# Patient Record
Sex: Female | Born: 1935
Health system: Southern US, Community
[De-identification: ages and names within clinical notes are randomized; demographics above are authoritative.]

## PROBLEM LIST (undated history)

## (undated) DIAGNOSIS — T148XXA Other injury of unspecified body region, initial encounter: Secondary | ICD-10-CM

## (undated) DIAGNOSIS — J309 Allergic rhinitis, unspecified: Secondary | ICD-10-CM

## (undated) DIAGNOSIS — R609 Edema, unspecified: Secondary | ICD-10-CM

## (undated) DIAGNOSIS — M810 Age-related osteoporosis without current pathological fracture: Secondary | ICD-10-CM

## (undated) DIAGNOSIS — E785 Hyperlipidemia, unspecified: Secondary | ICD-10-CM

## (undated) DIAGNOSIS — I10 Essential (primary) hypertension: Secondary | ICD-10-CM

## (undated) DIAGNOSIS — E079 Disorder of thyroid, unspecified: Secondary | ICD-10-CM

## (undated) HISTORY — DX: Other injury of unspecified body region, initial encounter: T14.8XXA

## (undated) HISTORY — DX: Essential (primary) hypertension: I10

## (undated) HISTORY — DX: Edema, unspecified: R60.9

## (undated) HISTORY — DX: Hyperlipidemia, unspecified: E78.5

## (undated) HISTORY — PX: APPENDECTOMY: SHX54

## (undated) HISTORY — DX: Allergic rhinitis, unspecified: J30.9

## (undated) HISTORY — PX: TONSILLECTOMY: SUR1361

## (undated) HISTORY — DX: Age-related osteoporosis without current pathological fracture: M81.0

## (undated) HISTORY — DX: Disorder of thyroid, unspecified: E07.9

---

## 2010-05-12 LAB — HM MAMMOGRAPHY

## 2010-05-20 ENCOUNTER — Encounter: Payer: Self-pay | Admitting: Nurse Practitioner

## 2010-05-20 DIAGNOSIS — I10 Essential (primary) hypertension: Secondary | ICD-10-CM

## 2010-05-20 DIAGNOSIS — E785 Hyperlipidemia, unspecified: Secondary | ICD-10-CM

## 2010-05-20 DIAGNOSIS — R42 Dizziness and giddiness: Secondary | ICD-10-CM

## 2010-05-20 DIAGNOSIS — J309 Allergic rhinitis, unspecified: Secondary | ICD-10-CM | POA: Insufficient documentation

## 2010-05-20 DIAGNOSIS — R0609 Other forms of dyspnea: Secondary | ICD-10-CM

## 2010-05-20 DIAGNOSIS — M810 Age-related osteoporosis without current pathological fracture: Secondary | ICD-10-CM | POA: Insufficient documentation

## 2011-03-02 DIAGNOSIS — B079 Viral wart, unspecified: Secondary | ICD-10-CM | POA: Diagnosis not present

## 2011-03-02 DIAGNOSIS — R609 Edema, unspecified: Secondary | ICD-10-CM | POA: Diagnosis not present

## 2011-03-02 DIAGNOSIS — E039 Hypothyroidism, unspecified: Secondary | ICD-10-CM | POA: Diagnosis not present

## 2011-03-02 DIAGNOSIS — I10 Essential (primary) hypertension: Secondary | ICD-10-CM | POA: Diagnosis not present

## 2011-06-22 DIAGNOSIS — I10 Essential (primary) hypertension: Secondary | ICD-10-CM | POA: Diagnosis not present

## 2011-06-22 DIAGNOSIS — E785 Hyperlipidemia, unspecified: Secondary | ICD-10-CM | POA: Diagnosis not present

## 2011-06-22 DIAGNOSIS — E039 Hypothyroidism, unspecified: Secondary | ICD-10-CM | POA: Diagnosis not present

## 2011-06-22 DIAGNOSIS — E559 Vitamin D deficiency, unspecified: Secondary | ICD-10-CM | POA: Diagnosis not present

## 2011-10-07 DIAGNOSIS — B079 Viral wart, unspecified: Secondary | ICD-10-CM | POA: Diagnosis not present

## 2011-10-12 DIAGNOSIS — R5381 Other malaise: Secondary | ICD-10-CM | POA: Diagnosis not present

## 2011-10-12 DIAGNOSIS — E785 Hyperlipidemia, unspecified: Secondary | ICD-10-CM | POA: Diagnosis not present

## 2011-10-12 DIAGNOSIS — E039 Hypothyroidism, unspecified: Secondary | ICD-10-CM | POA: Diagnosis not present

## 2011-10-12 DIAGNOSIS — R5383 Other fatigue: Secondary | ICD-10-CM | POA: Diagnosis not present

## 2011-10-12 DIAGNOSIS — I10 Essential (primary) hypertension: Secondary | ICD-10-CM | POA: Diagnosis not present

## 2011-10-12 DIAGNOSIS — R609 Edema, unspecified: Secondary | ICD-10-CM | POA: Diagnosis not present

## 2011-11-30 DIAGNOSIS — Z23 Encounter for immunization: Secondary | ICD-10-CM | POA: Diagnosis not present

## 2012-02-01 DIAGNOSIS — E039 Hypothyroidism, unspecified: Secondary | ICD-10-CM | POA: Diagnosis not present

## 2012-02-01 DIAGNOSIS — E785 Hyperlipidemia, unspecified: Secondary | ICD-10-CM | POA: Diagnosis not present

## 2012-02-01 DIAGNOSIS — I10 Essential (primary) hypertension: Secondary | ICD-10-CM | POA: Diagnosis not present

## 2012-02-01 DIAGNOSIS — R609 Edema, unspecified: Secondary | ICD-10-CM | POA: Diagnosis not present

## 2012-02-01 DIAGNOSIS — E559 Vitamin D deficiency, unspecified: Secondary | ICD-10-CM | POA: Diagnosis not present

## 2012-05-30 ENCOUNTER — Other Ambulatory Visit: Payer: Self-pay | Admitting: Nurse Practitioner

## 2012-05-30 MED ORDER — SIMVASTATIN 20 MG PO TABS: 20.0000 mg | ORAL_TABLET | Freq: Every day | ORAL | Status: DC

## 2012-07-04 ENCOUNTER — Encounter: Payer: Self-pay | Admitting: Nurse Practitioner

## 2012-07-04 ENCOUNTER — Ambulatory Visit (INDEPENDENT_AMBULATORY_CARE_PROVIDER_SITE_OTHER): Payer: Medicare Other | Admitting: Nurse Practitioner

## 2012-07-04 VITALS — BP 132/65 | HR 61 | Temp 97.2°F | Ht 63.0 in | Wt 174.0 lb

## 2012-07-04 DIAGNOSIS — I10 Essential (primary) hypertension: Secondary | ICD-10-CM

## 2012-07-04 DIAGNOSIS — M810 Age-related osteoporosis without current pathological fracture: Secondary | ICD-10-CM

## 2012-07-04 DIAGNOSIS — E785 Hyperlipidemia, unspecified: Secondary | ICD-10-CM

## 2012-07-04 LAB — COMPLETE METABOLIC PANEL WITH GFR
Alkaline Phosphatase: 63 U/L (ref 39–117)
BUN: 24 mg/dL — ABNORMAL HIGH (ref 6–23)
CO2: 25 mEq/L (ref 19–32)
Creat: 1.18 mg/dL — ABNORMAL HIGH (ref 0.50–1.10)
GFR, Est African American: 51 mL/min — ABNORMAL LOW
GFR, Est Non African American: 45 mL/min — ABNORMAL LOW
Glucose, Bld: 93 mg/dL (ref 70–99)
Sodium: 140 mEq/L (ref 135–145)
Total Bilirubin: 0.7 mg/dL (ref 0.3–1.2)
Total Protein: 7.1 g/dL (ref 6.0–8.3)

## 2012-07-04 MED ORDER — METOPROLOL TARTRATE 50 MG PO TABS: 50.0000 mg | ORAL_TABLET | Freq: Two times a day (BID) | ORAL | Status: DC

## 2012-07-04 NOTE — Patient Instructions (Signed)

## 2012-07-04 NOTE — Progress Notes (Signed)
Subjective:    Patient ID: Audrey Manning, female    DOB: 1932-05-14, 79 y.o.   MRN: [id number]  Hypertension This is a chronic problem. The current episode started more than 1 year ago. The problem is unchanged. The problem is controlled. Associated symptoms include peripheral edema. Pertinent negatives include no headaches, neck pain, orthopnea, palpitations or shortness of breath. There are no associated agents to hypertension. Risk factors for coronary artery disease include obesity, sedentary lifestyle and post-menopausal state. Past treatments include calcium channel blockers, angiotensin blockers, beta blockers and diuretics. The current treatment provides significant improvement. Hypertensive end-organ damage includes a thyroid problem.  Hyperlipidemia This is a chronic problem. The current episode started more than 1 year ago. The problem is controlled. Recent lipid tests were reviewed and are normal. Exacerbating diseases include obesity. There are no known factors aggravating her hyperlipidemia. Pertinent negatives include no focal weakness, leg pain, myalgias or shortness of breath. Current antihyperlipidemic treatment includes statins. The current treatment provides significant improvement of lipids. Compliance problems include adherence to diet and adherence to exercise.  Risk factors for coronary artery disease include hypertension, obesity, post-menopausal and a sedentary lifestyle.  Thyroid Problem Presents for follow-up visit. Symptoms include hair loss. Patient reports no cold intolerance, diaphoresis, diarrhea, dry skin, hoarse voice, leg swelling, menstrual problem, palpitations, weight gain or weight loss. The symptoms have been stable. Her past medical history is significant for hyperlipidemia.  osteoporosis Fosamax weekly- No C/o side effects.     Review of Systems  Constitutional: Negative for weight loss, weight gain and diaphoresis.  HENT: Negative for hoarse voice and  neck pain.   Respiratory: Negative for shortness of breath.   Cardiovascular: Negative for palpitations and orthopnea.  Gastrointestinal: Negative for diarrhea.  Endocrine: Negative for cold intolerance.  Genitourinary: Negative for menstrual problem.  Musculoskeletal: Negative for myalgias.  Neurological: Negative for focal weakness and headaches.  All other systems reviewed and are negative.       Objective:   Physical Exam  Constitutional: She is oriented to person, place, and time. She appears well-developed and well-nourished.  HENT:  Nose: Nose normal.  Mouth/Throat: Oropharynx is clear and moist.  Eyes: EOM are normal.  Neck: Trachea normal, normal range of motion and full passive range of motion without pain. Neck supple. No JVD present. Carotid bruit is not present. No thyromegaly present.  Cardiovascular: Normal rate, regular rhythm, normal heart sounds and intact distal pulses.  Exam reveals no gallop and no friction rub.   No murmur heard. Pulmonary/Chest: Effort normal and breath sounds normal.  Abdominal: Soft. Bowel sounds are normal. She exhibits no distension and no mass. There is no tenderness.  Musculoskeletal: Normal range of motion.  Lymphadenopathy:    She has no cervical adenopathy.  Neurological: She is alert and oriented to person, place, and time. She has normal reflexes.  Skin: Skin is warm and dry.  Psychiatric: She has a normal mood and affect. Her behavior is normal. Judgment and thought content normal.    BP 132/65  Pulse 61  Temp(Src) 97.2 F (36.2 C) (Oral)  Ht 5\' 3"  (1.6 m)  Wt 174 lb (78.926 kg)  BMI 30.83 kg/m2       Assessment & Plan:  1. Hypertension Low NA+ diet  - metoprolol (LOPRESSOR) 50 MG tablet; Take 1 tablet (50 mg total) by mouth 2 (two) times daily.  Dispense: 180 tablet; Refill: 1 - COMPLETE METABOLIC PANEL WITH GFR  2. Hyperlipidemia Low fat diet an dexercise Continue  Zocor as rx - NMR Lipoprofile with Lipids  3.  Osteoporosis, unspecified Fosamax as rx  4. Hypothyroidism Continue levothyroxine as rx  Mary-Margaret Daphine Deutscher, FNP

## 2012-07-06 LAB — NMR LIPOPROFILE WITH LIPIDS
HDL Size: 9.3 nm (ref >=9.2)
HDL-C: 51 mg/dL (ref >=40)
LDL Particle Number: 766 nmol/L (ref <1000)
LDL Size: 20.3 nm — ABNORMAL LOW (ref >20.5)
Large HDL-P: 9.6 umol/L (ref >=4.8)
Triglycerides: 100 mg/dL (ref <150)
VLDL Size: 47.5 nm — ABNORMAL HIGH (ref <=46.6)

## 2012-08-25 ENCOUNTER — Other Ambulatory Visit: Payer: Self-pay | Admitting: *Deleted

## 2012-08-25 MED ORDER — LOSARTAN POTASSIUM 100 MG PO TABS: 100.0000 mg | ORAL_TABLET | Freq: Every day | ORAL | Status: DC

## 2012-08-25 MED ORDER — AMLODIPINE BESYLATE 5 MG PO TABS: 5.0000 mg | ORAL_TABLET | Freq: Every day | ORAL | Status: DC

## 2012-08-25 MED ORDER — SIMVASTATIN 20 MG PO TABS: 20.0000 mg | ORAL_TABLET | Freq: Every day | ORAL | Status: DC

## 2012-10-10 ENCOUNTER — Ambulatory Visit (INDEPENDENT_AMBULATORY_CARE_PROVIDER_SITE_OTHER): Payer: Medicare Other | Admitting: Nurse Practitioner

## 2012-10-10 ENCOUNTER — Encounter: Payer: Self-pay | Admitting: Nurse Practitioner

## 2012-10-10 ENCOUNTER — Ambulatory Visit: Payer: 59 | Admitting: Nurse Practitioner

## 2012-10-10 VITALS — BP 142/65 | HR 65 | Temp 97.9°F | Wt 176.0 lb

## 2012-10-10 DIAGNOSIS — I10 Essential (primary) hypertension: Secondary | ICD-10-CM | POA: Diagnosis not present

## 2012-10-10 DIAGNOSIS — M810 Age-related osteoporosis without current pathological fracture: Secondary | ICD-10-CM

## 2012-10-10 DIAGNOSIS — E785 Hyperlipidemia, unspecified: Secondary | ICD-10-CM | POA: Diagnosis not present

## 2012-10-10 MED ORDER — METOPROLOL TARTRATE 50 MG PO TABS: 50.0000 mg | ORAL_TABLET | Freq: Two times a day (BID) | ORAL | Status: DC

## 2012-10-10 NOTE — Progress Notes (Signed)
Subjective:    Patient ID: Audrey Manning, female    DOB: 1932/04/10, 79 y.o.   MRN: [id number]  Hypertension This is a chronic problem. The current episode started more than 1 year ago. The problem is unchanged. The problem is controlled. Associated symptoms include peripheral edema. Pertinent negatives include no headaches, neck pain, orthopnea, palpitations or shortness of breath. There are no associated agents to hypertension. Risk factors for coronary artery disease include obesity, sedentary lifestyle and post-menopausal state. Past treatments include calcium channel blockers, angiotensin blockers, beta blockers and diuretics. The current treatment provides significant improvement. Hypertensive end-organ damage includes a thyroid problem.  Hyperlipidemia This is a chronic problem. The current episode started more than 1 year ago. The problem is controlled. Recent lipid tests were reviewed and are normal. Exacerbating diseases include obesity. There are no known factors aggravating her hyperlipidemia. Pertinent negatives include no focal weakness, leg pain, myalgias or shortness of breath. Current antihyperlipidemic treatment includes statins. The current treatment provides significant improvement of lipids. Compliance problems include adherence to diet and adherence to exercise.  Risk factors for coronary artery disease include hypertension, obesity, post-menopausal and a sedentary lifestyle.  Thyroid Problem Presents for follow-up visit. Symptoms include hair loss. Patient reports no cold intolerance, diaphoresis, diarrhea, dry skin, hoarse voice, leg swelling, menstrual problem, palpitations, weight gain or weight loss. The symptoms have been stable. Her past medical history is significant for hyperlipidemia.  osteoporosis Fosamax weekly- No C/o side effects.     Review of Systems  Constitutional: Negative for weight loss, weight gain and diaphoresis.  HENT: Negative for hoarse voice and  neck pain.   Respiratory: Negative for shortness of breath.   Cardiovascular: Negative for palpitations and orthopnea.  Gastrointestinal: Negative for diarrhea.  Endocrine: Negative for cold intolerance.  Genitourinary: Negative for menstrual problem.  Musculoskeletal: Negative for myalgias.  Neurological: Negative for focal weakness and headaches.  All other systems reviewed and are negative.       Objective:   Physical Exam  Constitutional: She is oriented to person, place, and time. She appears well-developed and well-nourished.  HENT:  Nose: Nose normal.  Mouth/Throat: Oropharynx is clear and moist.  Eyes: EOM are normal.  Neck: Trachea normal, normal range of motion and full passive range of motion without pain. Neck supple. No JVD present. Carotid bruit is not present. No thyromegaly present.  Cardiovascular: Normal rate, regular rhythm, normal heart sounds and intact distal pulses.  Exam reveals no gallop and no friction rub.   No murmur heard. Pulmonary/Chest: Effort normal and breath sounds normal.  Abdominal: Soft. Bowel sounds are normal. She exhibits no distension and no mass. There is no tenderness.  Musculoskeletal: Normal range of motion.  Lymphadenopathy:    She has no cervical adenopathy.  Neurological: She is alert and oriented to person, place, and time. She has normal reflexes.  Skin: Skin is warm and dry.  Psychiatric: She has a normal mood and affect. Her behavior is normal. Judgment and thought content normal.    BP 142/65  Pulse 65  Temp(Src) 97.9 F (36.6 C) (Oral)  Wt 176 lb (79.833 kg)  BMI 31.18 kg/m2       Assessment & Plan:  1. Hypertension Low NA+ diet  - metoprolol (LOPRESSOR) 50 MG tablet; Take 1 tablet (50 mg total) by mouth 2 (two) times daily.  Dispense: 180 tablet; Refill: 1 - COMPLETE METABOLIC PANEL WITH GFR  2. Hyperlipidemia Low fat diet an dexercise Continue Zocor as rx - NMR Lipoprofile  with Lipids  3. Osteoporosis,  unspecified Fosamax as rx  4. Hypothyroidism Continue levothyroxine as rx  Health maintenance reviewed Will schedule dexa  Mary-Margaret Daphine Deutscher, FNP

## 2012-10-10 NOTE — Patient Instructions (Signed)

## 2012-10-11 LAB — CMP14+EGFR
ALT: 15 IU/L (ref 0–32)
AST: 25 IU/L (ref 0–40)
Albumin: 4.7 g/dL (ref 3.5–4.8)
Alkaline Phosphatase: 56 IU/L (ref 39–117)
BUN/Creatinine Ratio: 19 (ref 11–26)
CO2: 21 mmol/L (ref 18–29)
Chloride: 104 mmol/L (ref 97–108)
GFR calc Af Amer: 50 mL/min/{1.73_m2} — ABNORMAL LOW (ref >59)
Potassium: 5.9 mmol/L — ABNORMAL HIGH (ref 3.5–5.2)
Sodium: 139 mmol/L (ref 134–144)
Total Bilirubin: 0.5 mg/dL (ref 0.0–1.2)

## 2012-10-11 LAB — NMR, LIPOPROFILE
Cholesterol: 156 mg/dL (ref <200)
LDL Particle Number: 930 nmol/L (ref <1000)
LDL Size: 20.8 nm (ref >20.5)
LDLC SERPL CALC-MCNC: 67 mg/dL (ref <100)
LP-IR Score: 32 (ref <=45)

## 2012-11-16 ENCOUNTER — Other Ambulatory Visit: Payer: Medicare Other

## 2012-11-16 ENCOUNTER — Ambulatory Visit: Payer: 59

## 2012-11-18 ENCOUNTER — Other Ambulatory Visit: Payer: Self-pay

## 2012-11-18 MED ORDER — ALENDRONATE SODIUM 70 MG PO TABS: 70.0000 mg | ORAL_TABLET | ORAL | Status: DC

## 2013-01-03 ENCOUNTER — Ambulatory Visit (INDEPENDENT_AMBULATORY_CARE_PROVIDER_SITE_OTHER): Payer: Medicare Other

## 2013-01-03 DIAGNOSIS — Z23 Encounter for immunization: Secondary | ICD-10-CM

## 2013-01-16 ENCOUNTER — Ambulatory Visit (INDEPENDENT_AMBULATORY_CARE_PROVIDER_SITE_OTHER): Payer: Medicare Other | Admitting: Nurse Practitioner

## 2013-01-16 ENCOUNTER — Encounter: Payer: Self-pay | Admitting: Nurse Practitioner

## 2013-01-16 VITALS — BP 134/63 | HR 56 | Temp 98.4°F | Ht 63.0 in | Wt 173.0 lb

## 2013-01-16 DIAGNOSIS — E785 Hyperlipidemia, unspecified: Secondary | ICD-10-CM

## 2013-01-16 DIAGNOSIS — I10 Essential (primary) hypertension: Secondary | ICD-10-CM

## 2013-01-16 DIAGNOSIS — R0609 Other forms of dyspnea: Secondary | ICD-10-CM

## 2013-01-16 MED ORDER — ALENDRONATE SODIUM 70 MG PO TABS: 70.0000 mg | ORAL_TABLET | ORAL | Status: DC

## 2013-01-16 MED ORDER — SIMVASTATIN 20 MG PO TABS: 20.0000 mg | ORAL_TABLET | Freq: Every day | ORAL | Status: DC

## 2013-01-16 MED ORDER — FUROSEMIDE 20 MG PO TABS: 20.0000 mg | ORAL_TABLET | Freq: Every day | ORAL | Status: DC

## 2013-01-16 MED ORDER — LEVOTHYROXINE SODIUM 50 MCG PO TABS: 50.0000 ug | ORAL_TABLET | Freq: Every day | ORAL | Status: DC

## 2013-01-16 MED ORDER — LOSARTAN POTASSIUM 100 MG PO TABS: 100.0000 mg | ORAL_TABLET | Freq: Every day | ORAL | Status: DC

## 2013-01-16 MED ORDER — METOPROLOL TARTRATE 50 MG PO TABS: 50.0000 mg | ORAL_TABLET | Freq: Two times a day (BID) | ORAL | Status: DC

## 2013-01-16 MED ORDER — AMLODIPINE BESYLATE 5 MG PO TABS: 5.0000 mg | ORAL_TABLET | Freq: Every day | ORAL | Status: DC

## 2013-01-16 NOTE — Patient Instructions (Signed)

## 2013-01-16 NOTE — Progress Notes (Addendum)
Subjective:    Patient ID: Audrey Manning, female    DOB: October 09, 1932, 79 y.o.   MRN: [id number]  Hypertension This is a chronic problem. The current episode started more than 1 year ago. The problem is unchanged. The problem is controlled. Associated symptoms include peripheral edema. Pertinent negatives include no headaches, neck pain, orthopnea, palpitations or shortness of breath. There are no associated agents to hypertension. Risk factors for coronary artery disease include obesity, sedentary lifestyle and post-menopausal state. Past treatments include calcium channel blockers, angiotensin blockers, beta blockers and diuretics. The current treatment provides significant improvement. Hypertensive end-organ damage includes a thyroid problem.  Hyperlipidemia This is a chronic problem. The current episode started more than 1 year ago. The problem is controlled. Recent lipid tests were reviewed and are normal. Exacerbating diseases include obesity. There are no known factors aggravating her hyperlipidemia. Pertinent negatives include no focal weakness, leg pain, myalgias or shortness of breath. Current antihyperlipidemic treatment includes statins. The current treatment provides significant improvement of lipids. Compliance problems include adherence to diet and adherence to exercise.  Risk factors for coronary artery disease include hypertension, obesity, post-menopausal and a sedentary lifestyle.  Thyroid Problem Presents for follow-up visit. Symptoms include hair loss. Patient reports no cold intolerance, diaphoresis, diarrhea, dry skin, hoarse voice, leg swelling, menstrual problem, palpitations, weight gain or weight loss. The symptoms have been stable. Her past medical history is significant for hyperlipidemia.  osteoporosis Fosamax weekly- No C/o side effects.     Review of Systems  Constitutional: Negative for weight loss, weight gain and diaphoresis.  HENT: Negative for hoarse voice.    Respiratory: Negative for shortness of breath.   Cardiovascular: Negative for palpitations and orthopnea.  Gastrointestinal: Negative for diarrhea.  Endocrine: Negative for cold intolerance.  Genitourinary: Negative for menstrual problem.  Musculoskeletal: Negative for myalgias and neck pain.  Neurological: Negative for focal weakness and headaches.  All other systems reviewed and are negative.       Objective:   Physical Exam  Constitutional: She is oriented to person, place, and time. She appears well-developed and well-nourished.  HENT:  Nose: Nose normal.  Mouth/Throat: Oropharynx is clear and moist.  Eyes: EOM are normal.  Neck: Trachea normal, normal range of motion and full passive range of motion without pain. Neck supple. No JVD present. Carotid bruit is not present. No thyromegaly present.  Cardiovascular: Normal rate, regular rhythm, normal heart sounds and intact distal pulses.  Exam reveals no gallop and no friction rub.   No murmur heard. Pulmonary/Chest: Effort normal and breath sounds normal.  Abdominal: Soft. Bowel sounds are normal. She exhibits no distension and no mass. There is no tenderness.  Musculoskeletal: Normal range of motion.  Lymphadenopathy:    She has no cervical adenopathy.  Neurological: She is alert and oriented to person, place, and time. She has normal reflexes.  Skin: Skin is warm and dry.  Psychiatric: She has a normal mood and affect. Her behavior is normal. Judgment and thought content normal.    BP 134/63  Pulse 56  Temp(Src) 98.4 F (36.9 C) (Oral)  Ht 5\' 3"  (1.6 m)  Wt 173 lb (78.472 kg)  BMI 30.65 kg/m2       Assessment & Plan:   1. Hypertension   2. Hyperlipidemia   3. Dyspnea on exertion    Orders Placed This Encounter  Procedures  . CMP14+EGFR  . NMR, lipoprofile   Meds ordered this encounter  Medications  . alendronate (FOSAMAX) 70 MG tablet  Sig: Take 1 tablet (70 mg total) by mouth every 7 (seven) days.  Take with a full glass of water on an empty stomach.    Dispense:  12 tablet    Refill:  1    Order Specific Question:  Supervising Provider    Answer:  Ernestina Penna [[id number]]  . amLODipine (NORVASC) 5 MG tablet    Sig: Take 1 tablet (5 mg total) by mouth daily.    Dispense:  90 tablet    Refill:  1    Order Specific Question:  Supervising Provider    Answer:  Ernestina Penna [[id number]]  . furosemide (LASIX) 20 MG tablet    Sig: Take 1 tablet (20 mg total) by mouth daily.    Dispense:  90 tablet    Refill:  1    Order Specific Question:  Supervising Provider    Answer:  Ernestina Penna [[id number]]  . levothyroxine (SYNTHROID, LEVOTHROID) 50 MCG tablet    Sig: Take 1 tablet (50 mcg total) by mouth daily before breakfast.    Dispense:  90 tablet    Refill:  1    Order Specific Question:  Supervising Provider    Answer:  Ernestina Penna [[id number]]  . losartan (COZAAR) 100 MG tablet    Sig: Take 1 tablet (100 mg total) by mouth daily.    Dispense:  90 tablet    Refill:  1    Order Specific Question:  Supervising Provider    Answer:  Ernestina Penna [[id number]]  . metoprolol (LOPRESSOR) 50 MG tablet    Sig: Take 1 tablet (50 mg total) by mouth 2 (two) times daily.    Dispense:  180 tablet    Refill:  1    Order Specific Question:  Supervising Provider    Answer:  Ernestina Penna [[id number]]  . simvastatin (ZOCOR) 20 MG tablet    Sig: Take 1 tablet (20 mg total) by mouth at bedtime.    Dispense:  90 tablet    Refill:  1    Order Specific Question:  Supervising Provider    Answer:  Deborra Medina    Continue all meds Labs pending Diet and exercise encouraged Health maintenance reviewed Follow up in 3 months hemocult cards given Mary-Margaret Daphine Deutscher, FNP

## 2013-01-17 LAB — NMR, LIPOPROFILE
HDL Cholesterol by NMR: 52 mg/dL (ref >=40)
HDL Particle Number: 28.8 umol/L — ABNORMAL LOW (ref >=30.5)
LDLC SERPL CALC-MCNC: 75 mg/dL (ref <100)
Triglycerides by NMR: 113 mg/dL (ref <150)

## 2013-01-17 LAB — CMP14+EGFR
ALT: 12 IU/L (ref 0–32)
AST: 21 IU/L (ref 0–40)
CO2: 24 mmol/L (ref 18–29)
Calcium: 9.5 mg/dL (ref 8.6–10.2)
Creatinine, Ser: 1.16 mg/dL — ABNORMAL HIGH (ref 0.57–1.00)
Globulin, Total: 2.4 g/dL (ref 1.5–4.5)
Glucose: 94 mg/dL (ref 65–99)
Sodium: 138 mmol/L (ref 134–144)
Total Protein: 6.7 g/dL (ref 6.0–8.5)

## 2013-02-09 ENCOUNTER — Encounter: Payer: Self-pay | Admitting: Nurse Practitioner

## 2013-02-09 ENCOUNTER — Ambulatory Visit (INDEPENDENT_AMBULATORY_CARE_PROVIDER_SITE_OTHER): Payer: Medicare Other | Admitting: Nurse Practitioner

## 2013-02-09 VITALS — BP 145/77 | HR 77 | Temp 98.8°F | Ht 63.0 in | Wt 172.0 lb

## 2013-02-09 DIAGNOSIS — S335XXA Sprain of ligaments of lumbar spine, initial encounter: Secondary | ICD-10-CM | POA: Diagnosis not present

## 2013-02-09 DIAGNOSIS — J209 Acute bronchitis, unspecified: Secondary | ICD-10-CM

## 2013-02-09 DIAGNOSIS — S39012A Strain of muscle, fascia and tendon of lower back, initial encounter: Secondary | ICD-10-CM

## 2013-02-09 MED ORDER — PREDNISONE 20 MG PO TABS: ORAL_TABLET | ORAL | Status: DC

## 2013-02-09 MED ORDER — BENZONATATE 100 MG PO CAPS: 200.0000 mg | ORAL_CAPSULE | Freq: Three times a day (TID) | ORAL | Status: DC | PRN

## 2013-02-09 MED ORDER — AZITHROMYCIN 250 MG PO TABS: ORAL_TABLET | ORAL | Status: DC

## 2013-02-09 NOTE — Progress Notes (Signed)
Subjective:    Patient ID: Phillips Climes, female    DOB: [birth year], 79 y.o.   MRN: [id number]  HPI Patient in c/o cough and congestion- Started about 1 week ago- no OTC meds. Also c/o buttocks pain- was carrying a piece of wood in the house and pulled that area.    Review of Systems  Constitutional: Negative for fever and chills.  HENT: Positive for congestion and rhinorrhea. Negative for sinus pressure, sneezing, sore throat and trouble swallowing.   Respiratory: Positive for cough (productive yellowish).   Musculoskeletal: Positive for back pain.       Objective:   Physical Exam  Constitutional: She appears well-developed and well-nourished.  HENT:  Right Ear: Hearing, tympanic membrane, external ear and ear canal normal.  Left Ear: Hearing, tympanic membrane, external ear and ear canal normal.  Nose: Mucosal edema and rhinorrhea present. Right sinus exhibits no maxillary sinus tenderness and no frontal sinus tenderness. Left sinus exhibits no maxillary sinus tenderness and no frontal sinus tenderness.  Mouth/Throat: Uvula is midline, oropharynx is clear and moist and mucous membranes are normal.  Cardiovascular: Normal rate, regular rhythm and normal heart sounds.   Pulmonary/Chest: Effort normal and breath sounds normal. She has no wheezes. She has no rales.  Deep tight cough  Musculoskeletal:  FROM of lumbar spine without pain (-)SLR bil  Neurological: She has normal reflexes. No cranial nerve deficit.   BP 145/77  Pulse 77  Temp(Src) 98.8 F (37.1 C) (Oral)  Ht 5\' 3"  (1.6 m)  Wt 172 lb (78.019 kg)  BMI 30.48 kg/m2        Assessment & Plan:   1. Acute bronchitis   2. Low back strain, initial encounter    Meds ordered this encounter  Medications  . predniSONE (DELTASONE) 20 MG tablet    Sig: 2 pills at sametime everyday for 5 days    Dispense:  10 tablet    Refill:  0    Order Specific Question:  Supervising Provider    Answer:  Ernestina Penna [[id number]]    . azithromycin (ZITHROMAX Z-PAK) 250 MG tablet    Sig: As directed    Dispense:  6 each    Refill:  0    Order Specific Question:  Supervising Provider    Answer:  Ernestina Penna [[id number]]  . benzonatate (TESSALON PERLES) 100 MG capsule    Sig: Take 2 capsules (200 mg total) by mouth 3 (three) times daily as needed for cough.    Dispense:  30 capsule    Refill:  0    Order Specific Question:  Supervising Provider    Answer:  Ernestina Penna [[id number]]   Moist heat to back- no heavy lifting 1. Take meds as prescribed 2. Use a cool mist humidifier especially during the winter months and when heat has  been humid. 3. Use saline nose sprays frequently 4. Saline irrigations of the nose can be very helpful if done frequently.  * 4X daily for 1 week*  * Use of a nettie pot can be helpful with this. Follow directions with this* 5. Drink plenty of fluids 6. Keep thermostat turn down low 7.For any cough or congestion  Use plain Mucinex- regular strength or max strength is fine   * Children- consult with Pharmacist for dosing 8. For fever or aces or pains- take tylenol or ibuprofen appropriate for age and weight.  * for fevers greater than 101 orally you may alternate ibuprofen and tylenol every  3 hours.   [name] Done, FNP

## 2013-02-09 NOTE — Patient Instructions (Signed)

## 2013-04-13 ENCOUNTER — Telehealth: Payer: Self-pay | Admitting: Nurse Practitioner

## 2013-04-13 MED ORDER — TRIAMCINOLONE ACETONIDE 0.1 % EX CREA: 1.0000 "application " | TOPICAL_CREAM | Freq: Two times a day (BID) | CUTANEOUS | Status: DC

## 2013-04-13 NOTE — Telephone Encounter (Signed)
rx sent to pharmacy

## 2013-04-19 ENCOUNTER — Telehealth: Payer: Self-pay | Admitting: Nurse Practitioner

## 2013-04-19 DIAGNOSIS — I10 Essential (primary) hypertension: Secondary | ICD-10-CM

## 2013-04-19 MED ORDER — METOPROLOL TARTRATE 50 MG PO TABS: 50.0000 mg | ORAL_TABLET | Freq: Two times a day (BID) | ORAL | Status: DC

## 2013-04-19 MED ORDER — AZITHROMYCIN 250 MG PO TABS: ORAL_TABLET | ORAL | Status: DC

## 2013-04-19 MED ORDER — BENZONATATE 100 MG PO CAPS: 200.0000 mg | ORAL_CAPSULE | Freq: Three times a day (TID) | ORAL | Status: DC | PRN

## 2013-04-19 NOTE — Telephone Encounter (Signed)
rx sent to pharmacy- no prednisone without being seen

## 2013-04-19 NOTE — Telephone Encounter (Signed)
Patient coughing wanting antibiotic, prednisone and cough med

## 2013-04-19 NOTE — Addendum Note (Signed)
Addended by: [name] on: 04/19/2013 12:59 PM   Modules accepted: Orders

## 2013-04-19 NOTE — Telephone Encounter (Signed)
NTBS.

## 2013-04-21 ENCOUNTER — Ambulatory Visit: Payer: Medicare Other

## 2013-04-21 ENCOUNTER — Ambulatory Visit (INDEPENDENT_AMBULATORY_CARE_PROVIDER_SITE_OTHER): Payer: Medicare Other

## 2013-04-21 ENCOUNTER — Other Ambulatory Visit: Payer: Self-pay | Admitting: Nurse Practitioner

## 2013-04-21 ENCOUNTER — Encounter: Payer: Self-pay | Admitting: Nurse Practitioner

## 2013-04-21 ENCOUNTER — Ambulatory Visit (INDEPENDENT_AMBULATORY_CARE_PROVIDER_SITE_OTHER): Payer: Medicare Other | Admitting: Nurse Practitioner

## 2013-04-21 ENCOUNTER — Other Ambulatory Visit: Payer: Self-pay | Admitting: Family Medicine

## 2013-04-21 VITALS — BP 128/86 | HR 78 | Temp 98.4°F | Ht 62.0 in | Wt 168.0 lb

## 2013-04-21 DIAGNOSIS — R05 Cough: Secondary | ICD-10-CM

## 2013-04-21 DIAGNOSIS — R059 Cough, unspecified: Secondary | ICD-10-CM

## 2013-04-21 DIAGNOSIS — J209 Acute bronchitis, unspecified: Secondary | ICD-10-CM

## 2013-04-21 MED ORDER — METHYLPREDNISOLONE ACETATE 80 MG/ML IJ SUSP
80.0000 mg | Freq: Once | INTRAMUSCULAR | Status: AC
  Administered 2013-04-21: 80 mg via INTRAMUSCULAR

## 2013-04-21 MED ORDER — PREDNISONE 20 MG PO TABS
ORAL_TABLET | ORAL | Status: DC
Start: 2013-04-21 — End: 2013-05-08

## 2013-04-21 NOTE — Patient Instructions (Signed)

## 2013-04-21 NOTE — Progress Notes (Signed)
   Subjective:    Patient ID: Audrey Manning, female    DOB: 05-20-1932, 80 y.o.   MRN: [id number]  HPI Patient in today with c/o cough- zpak and tessalon perles- she said the last 2 days she has felt worse like she could not catch her breath. SHe denies any chest pain.    Review of Systems  Constitutional: Negative.   HENT: Negative.   Eyes: Negative.   Respiratory: Positive for cough, shortness of breath and wheezing.   Cardiovascular: Negative.   Gastrointestinal: Negative.   Genitourinary: Negative.   Musculoskeletal: Negative.   All other systems reviewed and are negative.       Objective:   Physical Exam  Constitutional: She is oriented to person, place, and time. She appears well-developed and well-nourished.  Cardiovascular: Normal rate, regular rhythm and normal heart sounds.   Pulmonary/Chest: Effort normal. No respiratory distress. She has wheezes (bil bases).  rhochi upper lobes bil- Wet sounding cough  Neurological: She is alert and oriented to person, place, and time.  Skin: Skin is warm and dry.  Psychiatric: She has a normal mood and affect. Her behavior is normal. Judgment and thought content normal.    BP 128/86  Pulse 78  Temp(Src) 98.4 F (36.9 C) (Oral)  Ht 5\' 2"  (1.575 m)  Wt 168 lb (76.204 kg)  BMI 30.72 kg/m2  SpO2 96%  Chest x ray- bronchitic changes-Preliminary reading by Ronnald Collum, FNP  [facility]      Assessment & Plan:   1. Cough   2. Acute bronchitis    Meds ordered this encounter  Medications  . methylPREDNISolone acetate (DEPO-MEDROL) injection 80 mg    Sig:   . predniSONE (DELTASONE) 20 MG tablet    Sig: 2 tablets at sametime daily for 5 days- start Saturday    Dispense:  10 tablet    Refill:  0    Order Specific Question:  Supervising Provider    Answer:  Chipper Herb [[id number]]   1. Take meds as prescribed 2. Use a cool mist humidifier especially during the winter months and when heat has been humid. 3. Use saline nose  sprays frequently 4. Saline irrigations of the nose can be very helpful if done frequently.  * 4X daily for 1 week*  * Use of a nettie pot can be helpful with this. Follow directions with this* 5. Drink plenty of fluids 6. Keep thermostat turn down low 7.For any cough or congestion  Use plain Mucinex- regular strength or max strength is fine   * Children- consult with Pharmacist for dosing 8. For fever or aces or pains- take tylenol or ibuprofen appropriate for age and weight.  * for fevers greater than 101 orally you may alternate ibuprofen and tylenol every  3 hours.   [name] Done, FNP

## 2013-04-24 ENCOUNTER — Ambulatory Visit: Payer: Medicare Other | Admitting: Nurse Practitioner

## 2013-05-08 ENCOUNTER — Ambulatory Visit (INDEPENDENT_AMBULATORY_CARE_PROVIDER_SITE_OTHER): Payer: Medicare Other | Admitting: Nurse Practitioner

## 2013-05-08 ENCOUNTER — Encounter: Payer: Self-pay | Admitting: Nurse Practitioner

## 2013-05-08 VITALS — BP 137/65 | HR 76 | Temp 98.6°F | Ht 62.0 in | Wt 166.0 lb

## 2013-05-08 DIAGNOSIS — I10 Essential (primary) hypertension: Secondary | ICD-10-CM

## 2013-05-08 DIAGNOSIS — M810 Age-related osteoporosis without current pathological fracture: Secondary | ICD-10-CM

## 2013-05-08 DIAGNOSIS — E785 Hyperlipidemia, unspecified: Secondary | ICD-10-CM | POA: Diagnosis not present

## 2013-05-08 MED ORDER — ALENDRONATE SODIUM 70 MG PO TABS: 70.0000 mg | ORAL_TABLET | ORAL | Status: DC

## 2013-05-08 MED ORDER — SIMVASTATIN 20 MG PO TABS: 20.0000 mg | ORAL_TABLET | Freq: Every day | ORAL | Status: DC

## 2013-05-08 MED ORDER — LOSARTAN POTASSIUM 100 MG PO TABS: 100.0000 mg | ORAL_TABLET | Freq: Every day | ORAL | Status: DC

## 2013-05-08 MED ORDER — FUROSEMIDE 20 MG PO TABS: 20.0000 mg | ORAL_TABLET | Freq: Every day | ORAL | Status: DC

## 2013-05-08 MED ORDER — AMLODIPINE BESYLATE 5 MG PO TABS: 5.0000 mg | ORAL_TABLET | Freq: Every day | ORAL | Status: DC

## 2013-05-08 MED ORDER — LEVOTHYROXINE SODIUM 50 MCG PO TABS: 50.0000 ug | ORAL_TABLET | Freq: Every day | ORAL | Status: DC

## 2013-05-08 NOTE — Patient Instructions (Signed)

## 2013-05-08 NOTE — Progress Notes (Signed)
Subjective:    Patient ID: Audrey Manning, female    DOB: 06/15/1932, 80 y.o.   MRN: [id number]  Pateint in today for follow of chronic medical problems- no complaints today.  Hypertension This is a chronic problem. The current episode started more than 1 year ago. The problem is unchanged. The problem is controlled. Associated symptoms include peripheral edema. Pertinent negatives include no headaches, neck pain, orthopnea, palpitations or shortness of breath. There are no associated agents to hypertension. Risk factors for coronary artery disease include obesity, sedentary lifestyle and post-menopausal state. Past treatments include calcium channel blockers, angiotensin blockers, beta blockers and diuretics. The current treatment provides significant improvement. Hypertensive end-organ damage includes a thyroid problem.  Hyperlipidemia This is a chronic problem. The current episode started more than 1 year ago. The problem is controlled. Recent lipid tests were reviewed and are normal. Exacerbating diseases include obesity. There are no known factors aggravating her hyperlipidemia. Pertinent negatives include no focal weakness, leg pain, myalgias or shortness of breath. Current antihyperlipidemic treatment includes statins. The current treatment provides significant improvement of lipids. Compliance problems include adherence to diet and adherence to exercise.  Risk factors for coronary artery disease include hypertension, obesity, post-menopausal and a sedentary lifestyle.  Thyroid Problem Presents for follow-up visit. Symptoms include hair loss. Patient reports no cold intolerance, diaphoresis, diarrhea, dry skin, hoarse voice, leg swelling, menstrual problem, palpitations, weight gain or weight loss. The symptoms have been stable. Her past medical history is significant for hyperlipidemia.  osteoporosis Fosamax weekly- No C/o side effects.     Review of Systems  Constitutional: Negative for  weight loss, weight gain and diaphoresis.  HENT: Negative for hoarse voice.   Respiratory: Negative for shortness of breath.   Cardiovascular: Negative for palpitations and orthopnea.  Gastrointestinal: Negative for diarrhea.  Endocrine: Negative for cold intolerance.  Genitourinary: Negative for menstrual problem.  Musculoskeletal: Negative for myalgias and neck pain.  Neurological: Negative for focal weakness and headaches.  All other systems reviewed and are negative.       Objective:   Physical Exam  Constitutional: She is oriented to person, place, and time. She appears well-developed and well-nourished.  HENT:  Nose: Nose normal.  Mouth/Throat: Oropharynx is clear and moist.  Eyes: EOM are normal.  Neck: Trachea normal, normal range of motion and full passive range of motion without pain. Neck supple. No JVD present. Carotid bruit is not present. No thyromegaly present.  Cardiovascular: Normal rate, regular rhythm, normal heart sounds and intact distal pulses.  Exam reveals no gallop and no friction rub.   No murmur heard. Pulmonary/Chest: Effort normal and breath sounds normal.  Abdominal: Soft. Bowel sounds are normal. She exhibits no distension and no mass. There is no tenderness.  Musculoskeletal: Normal range of motion.  Lymphadenopathy:    She has no cervical adenopathy.  Neurological: She is alert and oriented to person, place, and time. She has normal reflexes.  Skin: Skin is warm and dry.  Psychiatric: She has a normal mood and affect. Her behavior is normal. Judgment and thought content normal.    BP 137/65  Pulse 76  Temp(Src) 98.6 F (37 C) (Oral)  Ht 5' 2"  (1.575 m)  Wt 166 lb (75.297 kg)  BMI 30.35 kg/m2       Assessment & Plan:    1. Osteoporosis   2. Hypertension   3. Hyperlipidemia    Orders Placed This Encounter  Procedures  . DG Bone Density    Standing Status: Future  Number of Occurrences:      Standing Expiration Date: 07/08/2014     Order Specific Question:  Reason for Exam (SYMPTOM  OR DIAGNOSIS REQUIRED)    Answer:  screening    Order Specific Question:  Preferred imaging location?    Answer:  Internal  . CMP14+EGFR  . NMR, lipoprofile   Meds ordered this encounter  Medications  . amLODipine (NORVASC) 5 MG tablet    Sig: Take 1 tablet (5 mg total) by mouth daily.    Dispense:  90 tablet    Refill:  1    Order Specific Question:  Supervising Provider    Answer:  Chipper Herb [[id number]]  . alendronate (FOSAMAX) 70 MG tablet    Sig: Take 1 tablet (70 mg total) by mouth every 7 (seven) days. Take with a full glass of water on an empty stomach.    Dispense:  12 tablet    Refill:  1    Order Specific Question:  Supervising Provider    Answer:  Chipper Herb [[id number]]  . furosemide (LASIX) 20 MG tablet    Sig: Take 1 tablet (20 mg total) by mouth daily.    Dispense:  90 tablet    Refill:  1    Order Specific Question:  Supervising Provider    Answer:  Chipper Herb [[id number]]  . levothyroxine (SYNTHROID, LEVOTHROID) 50 MCG tablet    Sig: Take 1 tablet (50 mcg total) by mouth daily before breakfast.    Dispense:  90 tablet    Refill:  1    Order Specific Question:  Supervising Provider    Answer:  Chipper Herb [[id number]]  . losartan (COZAAR) 100 MG tablet    Sig: Take 1 tablet (100 mg total) by mouth daily.    Dispense:  90 tablet    Refill:  1    Order Specific Question:  Supervising Provider    Answer:  Chipper Herb [[id number]]  . simvastatin (ZOCOR) 20 MG tablet    Sig: Take 1 tablet (20 mg total) by mouth at bedtime.    Dispense:  90 tablet    Refill:  1    Order Specific Question:  Supervising Provider    Answer:  Chipper Herb [[id number]]    Labs pending Health maintenance reviewed Diet and exercise encouraged Continue all meds Follow up  In 3 months   [place], FNP

## 2013-05-10 LAB — CMP14+EGFR
ALBUMIN: 4.6 g/dL (ref 3.5–4.8)
ALT: 12 IU/L (ref 0–32)
AST: 21 IU/L (ref 0–40)
Albumin/Globulin Ratio: 2 (ref 1.1–2.5)
Alkaline Phosphatase: 61 IU/L (ref 39–117)
BILIRUBIN TOTAL: 0.7 mg/dL (ref 0.0–1.2)
BUN/Creatinine Ratio: 24 (ref 11–26)
BUN: 30 mg/dL — AB (ref 8–27)
CO2: 21 mmol/L (ref 18–29)
CREATININE: 1.24 mg/dL — AB (ref 0.57–1.00)
Calcium: 9.4 mg/dL (ref 8.7–10.3)
Chloride: 103 mmol/L (ref 97–108)
GFR, EST AFRICAN AMERICAN: 48 mL/min/{1.73_m2} — AB (ref >59)
GFR, EST NON AFRICAN AMERICAN: 42 mL/min/{1.73_m2} — AB (ref >59)
GLUCOSE: 88 mg/dL (ref 65–99)
Globulin, Total: 2.3 g/dL (ref 1.5–4.5)
Potassium: 4.8 mmol/L (ref 3.5–5.2)
Sodium: 142 mmol/L (ref 134–144)
TOTAL PROTEIN: 6.9 g/dL (ref 6.0–8.5)

## 2013-05-11 LAB — NMR, LIPOPROFILE
CHOLESTEROL: 149 mg/dL (ref <200)
HDL Cholesterol by NMR: 65 mg/dL (ref >=40)
HDL Particle Number: 40.6 umol/L (ref >=30.5)
LDL PARTICLE NUMBER: 1012 nmol/L — AB (ref <1000)
LDL Size: 20.3 nm — ABNORMAL LOW (ref >20.5)
LDLC SERPL CALC-MCNC: 65 mg/dL (ref <100)
LP-IR Score: <25 (ref <=45)
Small LDL Particle Number: 637 nmol/L — ABNORMAL HIGH (ref <=527)
TRIGLYCERIDES BY NMR: 94 mg/dL (ref <150)

## 2013-05-17 ENCOUNTER — Other Ambulatory Visit: Payer: Medicare Other

## 2013-05-17 ENCOUNTER — Ambulatory Visit: Payer: Medicare Other

## 2013-08-09 ENCOUNTER — Ambulatory Visit (INDEPENDENT_AMBULATORY_CARE_PROVIDER_SITE_OTHER): Payer: Medicare Other

## 2013-08-09 ENCOUNTER — Ambulatory Visit (INDEPENDENT_AMBULATORY_CARE_PROVIDER_SITE_OTHER): Payer: Medicare Other | Admitting: Pharmacist

## 2013-08-09 ENCOUNTER — Encounter: Payer: Self-pay | Admitting: Pharmacist

## 2013-08-09 VITALS — Ht 63.0 in | Wt 168.0 lb

## 2013-08-09 DIAGNOSIS — M810 Age-related osteoporosis without current pathological fracture: Secondary | ICD-10-CM | POA: Diagnosis not present

## 2013-08-09 LAB — HM DEXA SCAN

## 2013-08-09 MED ORDER — CALCIUM CARBONATE-VITAMIN D 500-125 MG-UNIT PO TABS: 500.0000 mg | ORAL_TABLET | Freq: Every day | ORAL | Status: DC
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
## 2013-08-09 NOTE — Patient Instructions (Signed)
Fall Prevention and Home Safety Falls cause injuries and can affect all age groups. It is possible to use preventive measures to significantly decrease the likelihood of falls. There are many simple measures which can make your home safer and prevent falls. OUTDOORS  Repair cracks and edges of walkways and driveways.  Remove high doorway thresholds.  Trim shrubbery on the main path into your home.  Have good outside lighting.  Clear walkways of tools, rocks, debris, and clutter.  Check that handrails are not broken and are securely fastened. Both sides of steps should have handrails.  Have leaves, snow, and ice cleared regularly.  Use sand or salt on walkways during winter months.  In the garage, clean up grease or oil spills. BATHROOM  Install night lights.  Install grab bars by the toilet and in the tub and shower.  Use non-skid mats or decals in the tub or shower.  Place a plastic non-slip stool in the shower to sit on, if needed.  Keep floors dry and clean up all water on the floor immediately.  Remove soap buildup in the tub or shower on a regular basis.  Secure bath mats with non-slip, double-sided rug tape.  Remove throw rugs and tripping hazards from the floors. BEDROOMS  Install night lights.  Make sure a bedside light is easy to reach.  Do not use oversized bedding.  Keep a telephone by your bedside.  Have a firm chair with side arms to use for getting dressed.  Remove throw rugs and tripping hazards from the floor. KITCHEN  Keep handles on pots and pans turned toward the center of the stove. Use back burners when possible.  Clean up spills quickly and allow time for drying.  Avoid walking on wet floors.  Avoid hot utensils and knives.  Position shelves so they are not too high or low.  Place commonly used objects within easy reach.  If necessary, use a sturdy step stool with a grab bar when reaching.  Keep electrical cables out of the  way.  Do not use floor polish or wax that makes floors slippery. If you must use wax, use non-skid floor wax.  Remove throw rugs and tripping hazards from the floor. STAIRWAYS  Never leave objects on stairs.  Place handrails on both sides of stairways and use them. Fix any loose handrails. Make sure handrails on both sides of the stairways are as long as the stairs.  Check carpeting to make sure it is firmly attached along stairs. Make repairs to worn or loose carpet promptly.  Avoid placing throw rugs at the top or bottom of stairways, or properly secure the rug with carpet tape to prevent slippage. Get rid of throw rugs, if possible.  Have an electrician put in a light switch at the top and bottom of the stairs. OTHER FALL PREVENTION TIPS  Wear low-heel or rubber-soled shoes that are supportive and fit well. Wear closed toe shoes.  When using a stepladder, make sure it is fully opened and both spreaders are firmly locked. Do not climb a closed stepladder.  Add color or contrast paint or tape to grab bars and handrails in your home. Place contrasting color strips on first and last steps.  Learn and use mobility aids as needed. Install an electrical emergency response system.  Turn on lights to avoid dark areas. Replace light bulbs that burn out immediately. Get light switches that glow.  Arrange furniture to create clear pathways. Keep furniture in the same place.    Firmly attach carpet with non-skid or double-sided tape.  Eliminate uneven floor surfaces.  Select a carpet pattern that does not visually hide the edge of steps.  Be aware of all pets. OTHER HOME SAFETY TIPS  Set the water temperature for 120 F (48.8 C).  Keep emergency numbers on or near the telephone.  Keep smoke detectors on every level of the home and near sleeping areas. Document Released: 01/30/2002 Document Revised: 08/11/2011 Document Reviewed: 05/01/2011 Exit[employer] Patient Information 2015  [employer]. This information is not intended to replace advice given to you by your health care provider. Make sure you discuss any questions you have with your health care provider.                Exercise for Strong Bones  Exercise is important to build and maintain strong bones / bone density.  There are 2 types of exercises that are important to building and maintaining strong bones:  Weight- bearing and muscle-stregthening.  Weight-bearing Exercises  These exercises include activities that make you move against gravity while staying upright. Weight-bearing exercises can be high-impact or low-impact.  High-impact weight-bearing exercises help build bones and keep them strong. If you have broken a bone due to osteoporosis or are at risk of breaking a bone, you may need to avoid high-impact exercises. If you're not sure, you should check with your healthcare provider.  Examples of high-impact weight-bearing exercises are: Dancing  Doing high-impact aerobics  Hiking  Jogging/running  Jumping Rope  Stair climbing  Tennis  Low-impact weight-bearing exercises can also help keep bones strong and are a safe alternative if you cannot do high-impact exercises.   Examples of low-impact weight-bearing exercises are: Using elliptical training machines  Doing low-impact aerobics  Using stair-step machines  Fast walking on a treadmill or outside   Muscle-Strengthening Exercises These exercises include activities where you move your body, a weight or some other resistance against gravity. They are also known as resistance exercises and include: Lifting weights  Using elastic exercise bands  Using weight machines  Lifting your own body weight  Functional movements, such as standing and rising up on your toes  Yoga and Pilates can also improve strength, balance and flexibility. However, certain positions may not be safe for people with osteoporosis or those at increased risk of broken  bones. For example, exercises that have you bend forward may increase the chance of breaking a bone in the spine.   Non-Impact Exercises There are other types of exercises that can help prevent falls.  Non-impact exercises can help you to improve balance, posture and how well you move in everyday activities. Some of these exercises include: Balance exercises that strengthen your legs and test your balance, such as Tai Chi, can decrease your risk of falls.  Posture exercises that improve your posture and reduce rounded or "sloping" shoulders can help you decrease the chance of breaking a bone, especially in the spine.  Functional exercises that improve how well you move can help you with everyday activities and decrease your chance of falling and breaking a bone. For example, if you have trouble getting up from a chair or climbing stairs, you should do these activities as exercises.   **A physical therapist can teach you balance, posture and functional exercises. He/she can also help you learn which exercises are safe and appropriate for you.  [facility] has a physical therapy office in [place] in front of our office and referrals can be made for assessments   treatment as needed and strength and balance training.  If you would like to have an assessment with [facility] and our physical therapy team please let a nurse or provider know.    Zoledronic Acid injection (Paget's Disease, Osteoporosis) What is this medicine? ZOLEDRONIC ACID (ZOE le dron ik AS id) lowers the amount of calcium loss from bone. It is used to treat Paget's disease and osteoporosis in women. This medicine may be used for other purposes; ask your health care provider or pharmacist if you have questions. COMMON BRAND NAME(S): Reclast, Zometa What should I tell my health care provider before I take this medicine? They need to know if you have any of these conditions: -aspirin-sensitive asthma -cancer, especially if you are  receiving medicines used to treat cancer -dental disease or wear dentures -infection -kidney disease -low levels of calcium in the blood -past surgery on the parathyroid gland or intestines -receiving corticosteroids like dexamethasone or prednisone -an unusual or allergic reaction to zoledronic acid, other medicines, foods, dyes, or preservatives -pregnant or trying to get pregnant -breast-feeding How should I use this medicine? This medicine is for infusion into a vein. It is given by a health care professional in a hospital or clinic setting. Talk to your pediatrician regarding the use of this medicine in children. This medicine is not approved for use in children. Overdosage: If you think you have taken too much of this medicine contact a poison control center or emergency room at once. NOTE: This medicine is only for you. Do not share this medicine with others. What if I miss a dose? It is important not to miss your dose. Call your doctor or health care professional if you are unable to keep an appointment. What may interact with this medicine? -certain antibiotics given by injection -NSAIDs, medicines for pain and inflammation, like ibuprofen or naproxen -some diuretics like bumetanide, furosemide -teriparatide This list may not describe all possible interactions. Give your health care provider a list of all the medicines, herbs, non-prescription drugs, or dietary supplements you use. Also tell them if you smoke, drink alcohol, or use illegal drugs. Some items may interact with your medicine. What should I watch for while using this medicine? Visit your doctor or health care professional for regular checkups. It may be some time before you see the benefit from this medicine. Do not stop taking your medicine unless your doctor tells you to. Your doctor may order blood tests or other tests to see how you are doing. Women should inform their doctor if they wish to become pregnant or think  they might be pregnant. There is a potential for serious side effects to an unborn child. Talk to your health care professional or pharmacist for more information. You should make sure that you get enough calcium and vitamin D while you are taking this medicine. Discuss the foods you eat and the vitamins you take with your health care professional. Some people who take this medicine have severe bone, joint, and/or muscle pain. This medicine may also increase your risk for jaw problems or a broken thigh bone. Tell your doctor right away if you have severe pain in your jaw, bones, joints, or muscles. Tell your doctor if you have any pain that does not go away or that gets worse. Tell your dentist and dental surgeon that you are taking this medicine. You should not have major dental surgery while on this medicine. See your dentist to have a dental exam and fix any dental  problems before starting this medicine. Take good care of your teeth while on this medicine. Make sure you see your dentist for regular follow-up appointments. What side effects may I notice from receiving this medicine? Side effects that you should report to your doctor or health care professional as soon as possible: -allergic reactions like skin rash, itching or hives, swelling of the face, lips, or tongue -breathing problems -feeling faint or lightheaded, falls -jaw pain, especially after dental work -muscle cramps, stiffness, or weakness -trouble passing urine or change in the amount of urine  Side effects that usually do not require medical attention (report to your doctor or health care professional if they continue or are bothersome): -bone, joint, or muscle pain -constipation -diarrhea -fever -hair loss -irritation at site where injected -loss of appetite -nausea, vomiting -stomach upset -trouble sleeping -trouble swallowing -weak or tired  This list may not describe all possible side effects. Call your doctor for  medical advice about side effects. You may report side effects to FDA at 1-800-FDA-1088.  Where should I keep my medicine? This drug is given in a hospital or clinic and will not be stored at home. NOTE: This sheet is a summary. It may not cover all possible information. If you have questions about this medicine, talk to your doctor, pharmacist, or health care provider.  2014, [employer]. (2012-07-25 10:03:48)

## 2013-08-14 ENCOUNTER — Encounter: Payer: Self-pay | Admitting: Nurse Practitioner

## 2013-08-14 ENCOUNTER — Ambulatory Visit (INDEPENDENT_AMBULATORY_CARE_PROVIDER_SITE_OTHER): Payer: Medicare Other | Admitting: Nurse Practitioner

## 2013-08-14 VITALS — BP 118/58 | HR 58 | Temp 98.1°F | Ht 63.0 in | Wt 168.0 lb

## 2013-08-14 DIAGNOSIS — Z6828 Body mass index (BMI) 28.0-28.9, adult: Secondary | ICD-10-CM | POA: Insufficient documentation

## 2013-08-14 DIAGNOSIS — E038 Other specified hypothyroidism: Secondary | ICD-10-CM | POA: Diagnosis not present

## 2013-08-14 DIAGNOSIS — M810 Age-related osteoporosis without current pathological fracture: Secondary | ICD-10-CM

## 2013-08-14 DIAGNOSIS — E039 Hypothyroidism, unspecified: Secondary | ICD-10-CM | POA: Insufficient documentation

## 2013-08-14 DIAGNOSIS — Z6829 Body mass index (BMI) 29.0-29.9, adult: Secondary | ICD-10-CM

## 2013-08-14 DIAGNOSIS — Z713 Dietary counseling and surveillance: Secondary | ICD-10-CM

## 2013-08-14 DIAGNOSIS — I10 Essential (primary) hypertension: Secondary | ICD-10-CM | POA: Diagnosis not present

## 2013-08-14 DIAGNOSIS — E785 Hyperlipidemia, unspecified: Secondary | ICD-10-CM

## 2013-08-14 MED ORDER — LEVOTHYROXINE SODIUM 50 MCG PO TABS: 50.0000 ug | ORAL_TABLET | Freq: Every day | ORAL | Status: DC

## 2013-08-14 MED ORDER — LOSARTAN POTASSIUM 100 MG PO TABS: 100.0000 mg | ORAL_TABLET | Freq: Every day | ORAL | Status: DC

## 2013-08-14 MED ORDER — ALENDRONATE SODIUM 70 MG PO TABS: 70.0000 mg | ORAL_TABLET | ORAL | Status: DC

## 2013-08-14 MED ORDER — SIMVASTATIN 20 MG PO TABS: 20.0000 mg | ORAL_TABLET | Freq: Every day | ORAL | Status: DC

## 2013-08-14 MED ORDER — AMLODIPINE BESYLATE 5 MG PO TABS: 5.0000 mg | ORAL_TABLET | Freq: Every day | ORAL | Status: DC

## 2013-08-14 MED ORDER — FUROSEMIDE 20 MG PO TABS: 20.0000 mg | ORAL_TABLET | Freq: Every day | ORAL | Status: DC

## 2013-08-14 NOTE — Patient Instructions (Signed)

## 2013-08-14 NOTE — Progress Notes (Signed)
Subjective:    Patient ID: Audrey Manning, female    DOB: Jan 02, 1933, 80 y.o.   MRN: [id number]  Pateint in today for follow of chronic medical problems- no complaints today.  Hypertension This is a chronic problem. The current episode started more than 1 year ago. The problem is unchanged. The problem is controlled. Associated symptoms include peripheral edema. Pertinent negatives include no headaches, neck pain, orthopnea, palpitations or shortness of breath. There are no associated agents to hypertension. Risk factors for coronary artery disease include obesity, sedentary lifestyle and post-menopausal state. Past treatments include calcium channel blockers, angiotensin blockers, beta blockers and diuretics. The current treatment provides significant improvement. Hypertensive end-organ damage includes a thyroid problem.  Hyperlipidemia This is a chronic problem. The current episode started more than 1 year ago. The problem is controlled. Recent lipid tests were reviewed and are normal. Exacerbating diseases include obesity. There are no known factors aggravating her hyperlipidemia. Pertinent negatives include no focal weakness, leg pain, myalgias or shortness of breath. Current antihyperlipidemic treatment includes statins. The current treatment provides significant improvement of lipids. Compliance problems include adherence to diet and adherence to exercise.  Risk factors for coronary artery disease include hypertension, obesity, post-menopausal and a sedentary lifestyle.  Thyroid Problem Presents for follow-up visit. Symptoms include hair loss. Patient reports no cold intolerance, diaphoresis, diarrhea, dry skin, hoarse voice, leg swelling, menstrual problem, palpitations, weight gain or weight loss. The symptoms have been stable. Her past medical history is significant for hyperlipidemia.  osteoporosis Fosamax weekly- No C/o side effects.     Review of Systems  Constitutional: Negative for  weight loss, weight gain and diaphoresis.  HENT: Negative for hoarse voice.   Respiratory: Negative for shortness of breath.   Cardiovascular: Negative for palpitations and orthopnea.  Gastrointestinal: Negative for diarrhea.  Endocrine: Negative for cold intolerance.  Genitourinary: Negative for menstrual problem.  Musculoskeletal: Negative for myalgias and neck pain.  Neurological: Negative for focal weakness and headaches.  All other systems reviewed and are negative.      Objective:   Physical Exam  Constitutional: She is oriented to person, place, and time. She appears well-developed and well-nourished.  HENT:  Nose: Nose normal.  Mouth/Throat: Oropharynx is clear and moist.  Eyes: EOM are normal.  Neck: Trachea normal, normal range of motion and full passive range of motion without pain. Neck supple. No JVD present. Carotid bruit is not present. No thyromegaly present.  Cardiovascular: Normal rate, regular rhythm, normal heart sounds and intact distal pulses.  Exam reveals no gallop and no friction rub.   No murmur heard. Pulmonary/Chest: Effort normal and breath sounds normal.  Abdominal: Soft. Bowel sounds are normal. She exhibits no distension and no mass. There is no tenderness.  Musculoskeletal: Normal range of motion.  Lymphadenopathy:    She has no cervical adenopathy.  Neurological: She is alert and oriented to person, place, and time. She has normal reflexes.  Skin: Skin is warm and dry.  Psychiatric: She has a normal mood and affect. Her behavior is normal. Judgment and thought content normal.    BP 118/58  Pulse 58  Temp(Src) 98.1 F (36.7 C) (Oral)  Ht 5' 3" (1.6 m)  Wt 168 lb (76.204 kg)  BMI 29.77 kg/m2  SpO2 97%       Assessment & Plan:   1. Essential hypertension   2. Osteoporosis   3. Hyperlipidemia   4. Other specified hypothyroidism   5. BMI 29.0-29.9,adult   6. Weight loss counseling, encounter  for     Orders Placed This Encounter   Procedures  . CMP14+EGFR  . Thyroid Panel With TSH  . NMR, lipoprofile     Meds ordered this encounter  Medications  . alendronate (FOSAMAX) 70 MG tablet    Sig: Take 1 tablet (70 mg total) by mouth every 7 (seven) days. Take with a full glass of water on an empty stomach.    Dispense:  12 tablet    Refill:  1    Order Specific Question:  Supervising Provider    Answer:  Chipper Herb [[id number]]  . amLODipine (NORVASC) 5 MG tablet    Sig: Take 1 tablet (5 mg total) by mouth daily.    Dispense:  90 tablet    Refill:  1    Order Specific Question:  Supervising Provider    Answer:  Chipper Herb [[id number]]  . furosemide (LASIX) 20 MG tablet    Sig: Take 1 tablet (20 mg total) by mouth daily.    Dispense:  90 tablet    Refill:  1    Order Specific Question:  Supervising Provider    Answer:  Chipper Herb [[id number]]  . levothyroxine (SYNTHROID, LEVOTHROID) 50 MCG tablet    Sig: Take 1 tablet (50 mcg total) by mouth daily before breakfast.    Dispense:  90 tablet    Refill:  1    Order Specific Question:  Supervising Provider    Answer:  Chipper Herb [[id number]]  . losartan (COZAAR) 100 MG tablet    Sig: Take 1 tablet (100 mg total) by mouth daily.    Dispense:  90 tablet    Refill:  1    Order Specific Question:  Supervising Provider    Answer:  Chipper Herb [[id number]]  . simvastatin (ZOCOR) 20 MG tablet    Sig: Take 1 tablet (20 mg total) by mouth at bedtime.    Dispense:  90 tablet    Refill:  1    Order Specific Question:  Supervising Provider    Answer:  Chipper Herb [[id number]]    Labs pending Health maintenance reviewed Diet and exercise encouraged Continue all meds Follow up  In 3 months    [place], FNP

## 2013-08-15 LAB — THYROID PANEL WITH TSH
FREE THYROXINE INDEX: 2.6 (ref 1.2–4.9)
T3 Uptake Ratio: 31 % (ref 24–39)
T4, Total: 8.5 ug/dL (ref 4.5–12.0)
TSH: 1.03 u[IU]/mL (ref 0.450–4.500)

## 2013-08-15 LAB — CMP14+EGFR
A/G RATIO: 2 (ref 1.1–2.5)
ALK PHOS: 65 IU/L (ref 39–117)
ALT: 18 IU/L (ref 0–32)
AST: 26 IU/L (ref 0–40)
Albumin: 4.5 g/dL (ref 3.5–4.8)
BILIRUBIN TOTAL: 0.6 mg/dL (ref 0.0–1.2)
BUN / CREAT RATIO: 22 (ref 11–26)
BUN: 28 mg/dL — ABNORMAL HIGH (ref 8–27)
CO2: 20 mmol/L (ref 18–29)
Calcium: 9.1 mg/dL (ref 8.7–10.3)
Chloride: 105 mmol/L (ref 97–108)
Creatinine, Ser: 1.25 mg/dL — ABNORMAL HIGH (ref 0.57–1.00)
GFR, EST AFRICAN AMERICAN: 48 mL/min/{1.73_m2} — AB (ref >59)
GFR, EST NON AFRICAN AMERICAN: 41 mL/min/{1.73_m2} — AB (ref >59)
GLUCOSE: 93 mg/dL (ref 65–99)
Globulin, Total: 2.2 g/dL (ref 1.5–4.5)
POTASSIUM: 5.9 mmol/L — AB (ref 3.5–5.2)
Sodium: 140 mmol/L (ref 134–144)
TOTAL PROTEIN: 6.7 g/dL (ref 6.0–8.5)

## 2013-08-15 LAB — NMR, LIPOPROFILE
Cholesterol: 143 mg/dL (ref 100–199)
HDL CHOLESTEROL BY NMR: 61 mg/dL (ref >39)
HDL Particle Number: 37.6 umol/L (ref >=30.5)
LDL Particle Number: 696 nmol/L (ref <1000)
LDL Size: 20.8 nm (ref >20.5)
LDLC SERPL CALC-MCNC: 62 mg/dL (ref 0–99)
LP-IR Score: <25 (ref <=45)
SMALL LDL PARTICLE NUMBER: 142 nmol/L (ref <=527)
Triglycerides by NMR: 100 mg/dL (ref 0–149)

## 2013-08-16 ENCOUNTER — Other Ambulatory Visit: Payer: Medicare Other

## 2013-08-16 DIAGNOSIS — R7989 Other specified abnormal findings of blood chemistry: Secondary | ICD-10-CM

## 2013-08-17 LAB — POTASSIUM: Potassium: 5.5 mmol/L — ABNORMAL HIGH (ref 3.5–5.2)

## 2013-08-21 ENCOUNTER — Other Ambulatory Visit (INDEPENDENT_AMBULATORY_CARE_PROVIDER_SITE_OTHER): Payer: Medicare Other

## 2013-08-21 DIAGNOSIS — R7989 Other specified abnormal findings of blood chemistry: Secondary | ICD-10-CM

## 2013-08-22 LAB — POTASSIUM: POTASSIUM: 5 mmol/L (ref 3.5–5.2)

## 2013-09-01 ENCOUNTER — Ambulatory Visit (INDEPENDENT_AMBULATORY_CARE_PROVIDER_SITE_OTHER): Payer: Medicare Other | Admitting: Nurse Practitioner

## 2013-09-01 ENCOUNTER — Telehealth: Payer: Self-pay | Admitting: Nurse Practitioner

## 2013-09-01 ENCOUNTER — Encounter: Payer: Self-pay | Admitting: Nurse Practitioner

## 2013-09-01 VITALS — BP 142/62 | HR 63 | Temp 98.1°F | Ht 63.0 in | Wt 168.0 lb

## 2013-09-01 DIAGNOSIS — L03116 Cellulitis of left lower limb: Secondary | ICD-10-CM

## 2013-09-01 DIAGNOSIS — L02419 Cutaneous abscess of limb, unspecified: Secondary | ICD-10-CM | POA: Diagnosis not present

## 2013-09-01 DIAGNOSIS — L03119 Cellulitis of unspecified part of limb: Secondary | ICD-10-CM

## 2013-09-01 MED ORDER — CIPROFLOXACIN HCL 500 MG PO TABS: 500.0000 mg | ORAL_TABLET | Freq: Two times a day (BID) | ORAL | Status: DC

## 2013-09-01 NOTE — Progress Notes (Signed)
   Subjective:    Patient ID: Audrey Manning, female    DOB: Sep 02, 1932, 80 y.o.   MRN: [id number]  HPI Patient in c/o bug bite on left ankle- happened several days ago and the reddness is going up leg. SH ealso has a bite just below left knee     Review of Systems  Respiratory: Negative.   Cardiovascular: Negative.   Neurological: Negative.   Psychiatric/Behavioral: Negative.   All other systems reviewed and are negative.      Objective:   Physical Exam  Constitutional: She appears well-developed and well-nourished.  Cardiovascular: Normal rate and normal heart sounds.   Pulmonary/Chest: Effort normal and breath sounds normal.  Skin:  Erythematous lesion left medial ankle with 7cm annular erythema- warm to touch. Erythematous papule with 10cm of surrounding erythema and hot to touch left lateral knee area.   BP 142/62  Pulse 63  Temp(Src) 98.1 F (36.7 C) (Oral)  Ht 5\' 3"  (1.6 m)  Wt 168 lb (76.204 kg)  BMI 29.77 kg/m2        Assessment & Plan:  1. Cellulitis of leg, left Cool compresses Avoid picking or scratching Benadryl OTC itching RTO if not improving  Meds ordered this encounter  Medications  . ciprofloxacin (CIPRO) 500 MG tablet    Sig: Take 1 tablet (500 mg total) by mouth 2 (two) times daily.    Dispense:  20 tablet    Refill:  0    Order Specific Question:  Supervising Provider    Answer:  Chipper Herb [[id number]]   [place], FNP

## 2013-09-01 NOTE — Telephone Encounter (Signed)
appt given for 11:45 with mmm

## 2013-10-24 ENCOUNTER — Other Ambulatory Visit: Payer: Self-pay | Admitting: Nurse Practitioner

## 2013-12-04 ENCOUNTER — Encounter: Payer: Self-pay | Admitting: Nurse Practitioner

## 2013-12-04 ENCOUNTER — Ambulatory Visit (INDEPENDENT_AMBULATORY_CARE_PROVIDER_SITE_OTHER): Payer: Medicare Other | Admitting: Nurse Practitioner

## 2013-12-04 VITALS — BP 133/68 | HR 62 | Temp 96.7°F | Ht 63.0 in | Wt 170.2 lb

## 2013-12-04 DIAGNOSIS — E785 Hyperlipidemia, unspecified: Secondary | ICD-10-CM | POA: Diagnosis not present

## 2013-12-04 DIAGNOSIS — R42 Dizziness and giddiness: Secondary | ICD-10-CM | POA: Diagnosis not present

## 2013-12-04 DIAGNOSIS — E038 Other specified hypothyroidism: Secondary | ICD-10-CM

## 2013-12-04 DIAGNOSIS — I10 Essential (primary) hypertension: Secondary | ICD-10-CM | POA: Diagnosis not present

## 2013-12-04 DIAGNOSIS — Z6830 Body mass index (BMI) 30.0-30.9, adult: Secondary | ICD-10-CM

## 2013-12-04 NOTE — Patient Instructions (Signed)

## 2013-12-04 NOTE — Progress Notes (Signed)
Subjective:    Patient ID: Audrey Manning, female    DOB: Jan 19, 1933, 80 y.o.   MRN: [id number]  Pateint in today for follow of chronic medical problems- no complaints today.  Hypertension This is a chronic problem. The current episode started more than 1 year ago. The problem is unchanged. The problem is controlled. Associated symptoms include peripheral edema. Pertinent negatives include no headaches, neck pain, orthopnea, palpitations or shortness of breath. There are no associated agents to hypertension. Risk factors for coronary artery disease include obesity, sedentary lifestyle and post-menopausal state. Past treatments include calcium channel blockers, angiotensin blockers, beta blockers and diuretics. The current treatment provides significant improvement. Hypertensive end-organ damage includes a thyroid problem.  Hyperlipidemia This is a chronic problem. The current episode started more than 1 year ago. The problem is controlled. Recent lipid tests were reviewed and are normal. Exacerbating diseases include obesity. There are no known factors aggravating her hyperlipidemia. Pertinent negatives include no focal weakness, leg pain, myalgias or shortness of breath. Current antihyperlipidemic treatment includes statins. The current treatment provides significant improvement of lipids. Compliance problems include adherence to diet and adherence to exercise.  Risk factors for coronary artery disease include hypertension, obesity, post-menopausal and a sedentary lifestyle.  Thyroid Problem Presents for follow-up visit. Symptoms include hair loss. Patient reports no cold intolerance, diaphoresis, diarrhea, dry skin, hoarse voice, leg swelling, menstrual problem, palpitations, weight gain or weight loss. The symptoms have been stable. Her past medical history is significant for hyperlipidemia.  osteoporosis Fosamax weekly- No C/o side effects.     Review of Systems  Constitutional: Negative for  weight loss, weight gain and diaphoresis.  HENT: Negative for hoarse voice.   Respiratory: Negative for shortness of breath.   Cardiovascular: Negative for palpitations and orthopnea.  Gastrointestinal: Negative for diarrhea.  Endocrine: Negative for cold intolerance.  Genitourinary: Negative for menstrual problem.  Musculoskeletal: Negative for myalgias and neck pain.  Neurological: Negative for focal weakness and headaches.  All other systems reviewed and are negative.      Objective:   Physical Exam  Constitutional: She is oriented to person, place, and time. She appears well-developed and well-nourished.  HENT:  Nose: Nose normal.  Mouth/Throat: Oropharynx is clear and moist.  Eyes: EOM are normal.  Neck: Trachea normal, normal range of motion and full passive range of motion without pain. Neck supple. No JVD present. Carotid bruit is not present. No thyromegaly present.  Cardiovascular: Normal rate, regular rhythm, normal heart sounds and intact distal pulses.  Exam reveals no gallop and no friction rub.   No murmur heard. Pulmonary/Chest: Effort normal and breath sounds normal.  Abdominal: Soft. Bowel sounds are normal. She exhibits no distension and no mass. There is no tenderness.  Musculoskeletal: Normal range of motion. She exhibits edema (right lower ext).  Lymphadenopathy:    She has no cervical adenopathy.  Neurological: She is alert and oriented to person, place, and time. She has normal reflexes.  Skin: Skin is warm and dry.  Psychiatric: She has a normal mood and affect. Her behavior is normal. Judgment and thought content normal.    BP 133/68  Pulse 62  Temp(Src) 96.7 F (35.9 C) (Oral)  Ht _0  (1.6 m)  Wt 170 lb 3.2 oz (77.202 kg)  BMI 30.16 kg/m2  EKG- sinus [place], FNP      Assessment & Plan:   1. Vertigo Meclizine as needed  2. Other specified hypothyroidism  3. Essential hypertension Low Na+ diet - EKG  12-Lead -  CMP14+EGFR  4. Hyperlipidemia Low fat diet - NMR, lipoprofile  5. BMI 30.0-30.9,adult Discussed diet and exercise for person with BMI >25 Will recheck weight in 3-6 months   Refuses mammogram Labs pending Health maintenance reviewed Diet and exercise encouraged Continue all meds Follow up  In 3 months   [place], FNP

## 2013-12-05 LAB — CMP14+EGFR
A/G RATIO: 2.4 (ref 1.1–2.5)
ALT: 12 IU/L (ref 0–32)
AST: 21 IU/L (ref 0–40)
Albumin: 4.6 g/dL (ref 3.5–4.8)
Alkaline Phosphatase: 62 IU/L (ref 39–117)
BUN/Creatinine Ratio: 19 (ref 11–26)
BUN: 22 mg/dL (ref 8–27)
CALCIUM: 9.3 mg/dL (ref 8.7–10.3)
CO2: 21 mmol/L (ref 18–29)
CREATININE: 1.18 mg/dL — AB (ref 0.57–1.00)
Chloride: 103 mmol/L (ref 97–108)
GFR calc Af Amer: 51 mL/min/{1.73_m2} — ABNORMAL LOW (ref >59)
GFR calc non Af Amer: 44 mL/min/{1.73_m2} — ABNORMAL LOW (ref >59)
Globulin, Total: 1.9 g/dL (ref 1.5–4.5)
Glucose: 95 mg/dL (ref 65–99)
Potassium: 4.7 mmol/L (ref 3.5–5.2)
Sodium: 139 mmol/L (ref 134–144)
TOTAL PROTEIN: 6.5 g/dL (ref 6.0–8.5)
Total Bilirubin: 0.6 mg/dL (ref 0.0–1.2)

## 2013-12-05 LAB — NMR, LIPOPROFILE
CHOLESTEROL: 137 mg/dL (ref 100–199)
HDL CHOLESTEROL BY NMR: 67 mg/dL (ref >39)
HDL PARTICLE NUMBER: 38.2 umol/L (ref >=30.5)
LDL Particle Number: 793 nmol/L (ref <1000)
LDL Size: 20.5 nm (ref >20.5)
LDLC SERPL CALC-MCNC: 52 mg/dL (ref 0–99)
LP-IR Score: 29 (ref <=45)
Small LDL Particle Number: 562 nmol/L — ABNORMAL HIGH (ref <=527)
TRIGLYCERIDES BY NMR: 89 mg/dL (ref 0–149)

## 2014-01-13 ENCOUNTER — Ambulatory Visit (INDEPENDENT_AMBULATORY_CARE_PROVIDER_SITE_OTHER): Payer: Medicare Other | Admitting: *Deleted

## 2014-01-13 ENCOUNTER — Ambulatory Visit: Payer: Medicare Other

## 2014-01-13 DIAGNOSIS — Z23 Encounter for immunization: Secondary | ICD-10-CM

## 2014-02-07 ENCOUNTER — Other Ambulatory Visit: Payer: Self-pay | Admitting: Nurse Practitioner

## 2014-02-07 NOTE — Telephone Encounter (Signed)
Refilled per protocol, next appt 03/12/2014

## 2014-02-26 ENCOUNTER — Other Ambulatory Visit: Payer: Self-pay | Admitting: Nurse Practitioner

## 2014-03-01 ENCOUNTER — Ambulatory Visit (INDEPENDENT_AMBULATORY_CARE_PROVIDER_SITE_OTHER): Payer: Medicare Other | Admitting: Family Medicine

## 2014-03-01 ENCOUNTER — Encounter: Payer: Self-pay | Admitting: Family Medicine

## 2014-03-01 VITALS — BP 162/72 | HR 96 | Temp 98.4°F | Ht 63.0 in | Wt 169.5 lb

## 2014-03-01 DIAGNOSIS — J069 Acute upper respiratory infection, unspecified: Secondary | ICD-10-CM | POA: Diagnosis not present

## 2014-03-01 MED ORDER — BENZONATATE 100 MG PO CAPS: 100.0000 mg | ORAL_CAPSULE | Freq: Three times a day (TID) | ORAL | Status: DC | PRN

## 2014-03-01 MED ORDER — AZITHROMYCIN 250 MG PO TABS: ORAL_TABLET | ORAL | Status: DC

## 2014-03-01 NOTE — Progress Notes (Signed)
   Subjective:    Patient ID: Audrey Manning, female    DOB: 06-20-1932, 81 y.o.   MRN: [id number]  HPI C/o cough and uri sx's  Review of Systems  Constitutional: Negative for fever.  HENT: Negative for ear pain.   Eyes: Negative for discharge.  Respiratory: Negative for cough.   Cardiovascular: Negative for chest pain.  Gastrointestinal: Negative for abdominal distention.  Endocrine: Negative for polyuria.  Genitourinary: Negative for difficulty urinating.  Musculoskeletal: Negative for gait problem and neck pain.  Skin: Negative for color change and rash.  Neurological: Negative for speech difficulty and headaches.  Psychiatric/Behavioral: Negative for agitation.       Objective:    BP 162/72 mmHg  Pulse 96  Temp(Src) 98.4 F (36.9 C) (Oral)  Ht 5\' 3"  (1.6 m)  Wt 169 lb 8 oz (76.885 kg)  BMI 30.03 kg/m2 Physical Exam  Constitutional: She is oriented to person, place, and time. She appears well-developed and well-nourished.  HENT:  Head: Normocephalic and atraumatic.  Mouth/Throat: Oropharynx is clear and moist.  Eyes: Pupils are equal, round, and reactive to light.  Neck: Normal range of motion. Neck supple.  Cardiovascular: Normal rate and regular rhythm.   No murmur heard. Pulmonary/Chest: Effort normal and breath sounds normal.  Abdominal: Soft. Bowel sounds are normal. There is no tenderness.  Neurological: She is alert and oriented to person, place, and time.  Skin: Skin is warm and dry.  Psychiatric: She has a normal mood and affect.          Assessment & Plan:     ICD-9-CM ICD-10-CM   1. URI (upper respiratory infection) 465.9 J06.9 benzonatate (TESSALON PERLES) 100 MG capsule     azithromycin (ZITHROMAX) 250 MG tablet     No Follow-up on file.  Lysbeth Penner FNP

## 2014-03-12 ENCOUNTER — Encounter: Payer: Self-pay | Admitting: Nurse Practitioner

## 2014-03-12 ENCOUNTER — Ambulatory Visit (INDEPENDENT_AMBULATORY_CARE_PROVIDER_SITE_OTHER): Payer: Medicare Other | Admitting: Nurse Practitioner

## 2014-03-12 VITALS — BP 149/75 | HR 63 | Temp 97.2°F | Ht 63.0 in | Wt 169.0 lb

## 2014-03-12 DIAGNOSIS — M810 Age-related osteoporosis without current pathological fracture: Secondary | ICD-10-CM

## 2014-03-12 DIAGNOSIS — E785 Hyperlipidemia, unspecified: Secondary | ICD-10-CM

## 2014-03-12 DIAGNOSIS — I10 Essential (primary) hypertension: Secondary | ICD-10-CM

## 2014-03-12 MED ORDER — ALENDRONATE SODIUM 70 MG PO TABS: 70.0000 mg | ORAL_TABLET | ORAL | Status: DC

## 2014-03-12 MED ORDER — LEVOTHYROXINE SODIUM 50 MCG PO TABS: ORAL_TABLET | ORAL | Status: DC

## 2014-03-12 MED ORDER — FUROSEMIDE 20 MG PO TABS: 20.0000 mg | ORAL_TABLET | Freq: Every day | ORAL | Status: DC

## 2014-03-12 MED ORDER — LOSARTAN POTASSIUM 100 MG PO TABS: 100.0000 mg | ORAL_TABLET | Freq: Every day | ORAL | Status: DC

## 2014-03-12 MED ORDER — SIMVASTATIN 20 MG PO TABS: 20.0000 mg | ORAL_TABLET | Freq: Every day | ORAL | Status: DC

## 2014-03-12 MED ORDER — AMLODIPINE BESYLATE 5 MG PO TABS: 5.0000 mg | ORAL_TABLET | Freq: Every day | ORAL | Status: DC

## 2014-03-12 MED ORDER — METOPROLOL TARTRATE 50 MG PO TABS: 50.0000 mg | ORAL_TABLET | Freq: Two times a day (BID) | ORAL | Status: DC

## 2014-03-12 NOTE — Progress Notes (Signed)
Subjective:    Patient ID: Audrey Manning, female    DOB: 12-18-1932, 81 y.o.   MRN: [id number]  Pateint in today for follow of chronic medical problems- no complaints today.  Hypertension This is a chronic problem. The current episode started more than 1 year ago. The problem is unchanged. The problem is controlled. Pertinent negatives include no headaches, neck pain, palpitations or shortness of breath. Risk factors for coronary artery disease include dyslipidemia, obesity and post-menopausal state. The current treatment provides moderate improvement. Compliance problems include diet and exercise.  Hypertensive end-organ damage includes a thyroid problem.  Hyperlipidemia This is a chronic problem. The current episode started more than 1 year ago. The problem is controlled. Recent lipid tests were reviewed and are normal. Exacerbating diseases include hypothyroidism and obesity. She has no history of diabetes. Pertinent negatives include no myalgias or shortness of breath. Current antihyperlipidemic treatment includes statins. The current treatment provides no improvement of lipids. Compliance problems include adherence to diet and adherence to exercise.  Risk factors for coronary artery disease include dyslipidemia, hypertension, obesity and post-menopausal.  Thyroid Problem Patient reports no cold intolerance, diaphoresis, diarrhea, menstrual problem or palpitations. Her past medical history is significant for hyperlipidemia. There is no history of diabetes.  osteoporosis Fosamax weekly- No C/o side effects.     Review of Systems  Constitutional: Negative for diaphoresis.  Respiratory: Negative for shortness of breath.   Cardiovascular: Negative for palpitations.  Gastrointestinal: Negative for diarrhea.  Endocrine: Negative for cold intolerance.  Genitourinary: Negative for menstrual problem.  Musculoskeletal: Negative for myalgias and neck pain.  Neurological: Negative for headaches.   All other systems reviewed and are negative.      Objective:   Physical Exam  Constitutional: She is oriented to person, place, and time. She appears well-developed and well-nourished.  HENT:  Nose: Nose normal.  Mouth/Throat: Oropharynx is clear and moist.  Eyes: EOM are normal.  Neck: Trachea normal, normal range of motion and full passive range of motion without pain. Neck supple. No JVD present. Carotid bruit is not present. No thyromegaly present.  Cardiovascular: Normal rate, regular rhythm, normal heart sounds and intact distal pulses.  Exam reveals no gallop and no friction rub.   No murmur heard. Pulmonary/Chest: Effort normal and breath sounds normal.  Abdominal: Soft. Bowel sounds are normal. She exhibits no distension and no mass. There is no tenderness.  Musculoskeletal: Normal range of motion.  Lymphadenopathy:    She has no cervical adenopathy.  Neurological: She is alert and oriented to person, place, and time. She has normal reflexes.  Skin: Skin is warm and dry.  Psychiatric: She has a normal mood and affect. Her behavior is normal. Judgment and thought content normal.    BP 149/75 mmHg  Pulse 63  Temp(Src) 97.2 F (36.2 C) (Oral)  Ht $R'5\' 3"'jh$  (1.6 m)  Wt 169 lb (76.658 kg)  BMI 29.94 kg/m2       Assessment & Plan:    1. Essential hypertension Do not add salt to diet - amLODipine (NORVASC) 5 MG tablet; Take 1 tablet (5 mg total) by mouth daily.  Dispense: 90 tablet; Refill: 1 - furosemide (LASIX) 20 MG tablet; Take 1 tablet (20 mg total) by mouth daily.  Dispense: 90 tablet; Refill: 1 - losartan (COZAAR) 100 MG tablet; Take 1 tablet (100 mg total) by mouth daily.  Dispense: 90 tablet; Refill: 1 - metoprolol (LOPRESSOR) 50 MG tablet; Take 1 tablet (50 mg total) by mouth 2 (two) times  daily.  Dispense: 180 tablet; Refill: 1  2. Hyperlipidemia Low fat diet - simvastatin (ZOCOR) 20 MG tablet; Take 1 tablet (20 mg total) by mouth at bedtime.  Dispense: 90  tablet; Refill: 1  3. Osteoporosis Weight bearing exercises - alendronate (FOSAMAX) 70 MG tablet; Take 1 tablet (70 mg total) by mouth every 7 (seven) days. Take with a full glass of water on an empty stomach.  Dispense: 12 tablet; Refill: 1  Orders Placed This Encounter  Procedures  . CMP14+EGFR  . NMR, lipoprofile     Labs pending Health maintenance reviewed Diet and exercise encouraged Continue all meds Follow up  In 3 months   [place], FNP

## 2014-03-12 NOTE — Patient Instructions (Signed)

## 2014-03-13 LAB — CMP14+EGFR
ALBUMIN: 4.4 g/dL (ref 3.5–4.8)
ALK PHOS: 63 IU/L (ref 39–117)
ALT: 10 IU/L (ref 0–32)
AST: 20 IU/L (ref 0–40)
Albumin/Globulin Ratio: 1.9 (ref 1.1–2.5)
BILIRUBIN TOTAL: 0.4 mg/dL (ref 0.0–1.2)
BUN / CREAT RATIO: 29 — AB (ref 11–26)
BUN: 35 mg/dL — AB (ref 8–27)
CO2: 21 mmol/L (ref 18–29)
Calcium: 9.1 mg/dL (ref 8.7–10.3)
Chloride: 105 mmol/L (ref 97–108)
Creatinine, Ser: 1.2 mg/dL — ABNORMAL HIGH (ref 0.57–1.00)
GFR calc non Af Amer: 43 mL/min/{1.73_m2} — ABNORMAL LOW (ref >59)
GFR, EST AFRICAN AMERICAN: 50 mL/min/{1.73_m2} — AB (ref >59)
Globulin, Total: 2.3 g/dL (ref 1.5–4.5)
Glucose: 101 mg/dL — ABNORMAL HIGH (ref 65–99)
Potassium: 5 mmol/L (ref 3.5–5.2)
Sodium: 139 mmol/L (ref 134–144)
Total Protein: 6.7 g/dL (ref 6.0–8.5)

## 2014-03-13 LAB — NMR, LIPOPROFILE
Cholesterol: 148 mg/dL (ref 100–199)
HDL Cholesterol by NMR: 67 mg/dL (ref >39)
HDL Particle Number: 40.1 umol/L (ref >=30.5)
LDL Particle Number: 768 nmol/L (ref <1000)
LDL Size: 20.6 nm (ref >20.5)
LDL-C: 61 mg/dL (ref 0–99)
LP-IR Score: 25 (ref <=45)
SMALL LDL PARTICLE NUMBER: 461 nmol/L (ref <=527)
TRIGLYCERIDES BY NMR: 101 mg/dL (ref 0–149)

## 2014-06-13 ENCOUNTER — Encounter: Payer: Self-pay | Admitting: Nurse Practitioner

## 2014-06-13 ENCOUNTER — Ambulatory Visit (INDEPENDENT_AMBULATORY_CARE_PROVIDER_SITE_OTHER): Payer: Medicare Other | Admitting: Nurse Practitioner

## 2014-06-13 VITALS — BP 157/72 | HR 63 | Temp 96.9°F | Ht 63.0 in | Wt 167.0 lb

## 2014-06-13 DIAGNOSIS — E785 Hyperlipidemia, unspecified: Secondary | ICD-10-CM

## 2014-06-13 DIAGNOSIS — E038 Other specified hypothyroidism: Secondary | ICD-10-CM

## 2014-06-13 DIAGNOSIS — M810 Age-related osteoporosis without current pathological fracture: Secondary | ICD-10-CM | POA: Diagnosis not present

## 2014-06-13 DIAGNOSIS — Z6830 Body mass index (BMI) 30.0-30.9, adult: Secondary | ICD-10-CM

## 2014-06-13 DIAGNOSIS — I10 Essential (primary) hypertension: Secondary | ICD-10-CM | POA: Diagnosis not present

## 2014-06-13 DIAGNOSIS — Z23 Encounter for immunization: Secondary | ICD-10-CM | POA: Diagnosis not present

## 2014-06-13 MED ORDER — TRIAMCINOLONE ACETONIDE 0.1 % EX CREA: 1.0000 "application " | TOPICAL_CREAM | Freq: Two times a day (BID) | CUTANEOUS | Status: DC

## 2014-06-13 NOTE — Patient Instructions (Signed)
Bone Health Our bones do many things. They provide structure, protect organs, anchor muscles, and store calcium. Adequate calcium in your diet and weight-bearing physical activity help build strong bones, improve bone amounts, and may reduce the risk of weakening of bones (osteoporosis) later in life. PEAK BONE MASS By age 79, the average woman has acquired most of her skeletal bone mass. A large decline occurs in older adults which increases the risk of osteoporosis. In women this occurs around the time of menopause. It is important for young girls to reach their peak bone mass in order to maintain bone health throughout life. A person with high bone mass as a young adult will be more likely to have a higher bone mass later in life. Not enough calcium consumption and physical activity early on could result in a failure to achieve optimum bone mass in adulthood. OSTEOPOROSIS Osteoporosis is a disease of the bones. It is defined as low bone mass with deterioration of bone structure. Osteoporosis leads to an increase risk of fractures with falls. These fractures commonly happen in the wrist, hip, and spine. While men and women of all ages and background can develop osteoporosis, some of the risk factors for osteoporosis are:  Female.  White.  Postmenopausal.  Older adults.  Small in body size.  Eating a diet low in calcium.  Physically inactive.  Smoking.  Use of some medications.  Family history. CALCIUM Calcium is a mineral needed by the body for healthy bones, teeth, and proper function of the heart, muscles, and nerves. The body cannot produce calcium so it must be absorbed through food. Good sources of calcium include:  Dairy products (low fat or nonfat milk, cheese, and yogurt).  Dark green leafy vegetables (bok choy and broccoli).  Calcium fortified foods (orange juice, cereal, bread, soy beverages, and tofu products).  Nuts (almonds). Recommended amounts of calcium vary  for individuals. RECOMMENDED CALCIUM INTAKES Age and Amount in mg per day  Children 1 to 3 years / 700 mg  Children 4 to 8 years / 1,000 mg  Children 9 to 13 years / 1,300 mg  Teens 14 to 18 years / 1,300 mg  Adults 19 to 50 years / 1,000 mg  Adult women 51 to 70 years / 1,200 mg  Adults 71 years and older / 1,200 mg  Pregnant and breastfeeding teens / 1,300 mg  Pregnant and breastfeeding adults / 1,000 mg Vitamin D also plays an important role in healthy bone development. Vitamin D helps in the absorption of calcium. WEIGHT-BEARING PHYSICAL ACTIVITY Regular physical activity has many positive health benefits. Benefits include strong bones. Weight-bearing physical activity early in life is important in reaching peak bone mass. Weight-bearing physical activities cause muscles and bones to work against gravity. Some examples of weight bearing physical activities include:  Walking, jogging, or running.  Field Hockey.  Jumping rope.  Dancing.  Soccer.  Tennis or Racquetball.  Stair climbing.  Basketball.  Hiking.  Weight lifting.  Aerobic fitness classes. Including weight-bearing physical activity into an exercise plan is a great way to keep bones healthy. Adults: Engage in at least 30 minutes of moderate physical activity on most, preferably all, days of the week. Children: Engage in at least 60 minutes of moderate physical activity on most, preferably all, days of the week. FOR MORE INFORMATION [place] Department [employer], Center for Nutrition Policy and Promotion: www.cnpp.usda.gov National Osteoporosis Foundation: www.nof.org Document Released: 05/02/2003 Document Revised: 06/06/2012 Document Reviewed: 08/01/2008 [employer] Patient Information   2015 [employer]. This information is not intended to replace advice given to you by your health care provider. Make sure you discuss any questions you have with your health care provider.  

## 2014-06-13 NOTE — Progress Notes (Signed)
Subjective:    Patient ID: Audrey Manning, female    DOB: [birth year], 81 y.o.   MRN: [id number]  Pateint in today for follow of chronic medical problems- no complaints today.  Hypertension This is a chronic problem. The current episode started more than 1 year ago. The problem is unchanged. The problem is controlled. Pertinent negatives include no headaches, neck pain, palpitations or shortness of breath. Risk factors for coronary artery disease include dyslipidemia, obesity and post-menopausal state. The current treatment provides moderate improvement. Compliance problems include diet and exercise.  Hypertensive end-organ damage includes a thyroid problem.  Hyperlipidemia This is a chronic problem. The current episode started more than 1 year ago. The problem is controlled. Recent lipid tests were reviewed and are normal. Exacerbating diseases include hypothyroidism and obesity. She has no history of diabetes. Pertinent negatives include no myalgias or shortness of breath. Current antihyperlipidemic treatment includes statins. The current treatment provides no improvement of lipids. Compliance problems include adherence to diet and adherence to exercise.  Risk factors for coronary artery disease include dyslipidemia, hypertension, obesity and post-menopausal.  Thyroid Problem Patient reports no cold intolerance, diaphoresis, diarrhea, menstrual problem or palpitations. Her past medical history is significant for hyperlipidemia. There is no history of diabetes.  osteoporosis Fosamax weekly- No C/o side effects.     Review of Systems  Constitutional: Negative for diaphoresis.  Respiratory: Negative for shortness of breath.   Cardiovascular: Negative for palpitations.  Gastrointestinal: Negative for diarrhea.  Endocrine: Negative for cold intolerance.  Genitourinary: Negative for menstrual problem.  Musculoskeletal: Negative for myalgias and neck pain.  Neurological: Negative for headaches.   All other systems reviewed and are negative.      Objective:   Physical Exam  Constitutional: She is oriented to person, place, and time. She appears well-developed and well-nourished. No distress.  HENT:  Right Ear: External ear normal.  Left Ear: External ear normal.  Nose: Nose normal.  Mouth/Throat: Oropharynx is clear and moist. No oropharyngeal exudate.  Eyes: Conjunctivae and EOM are normal. Pupils are equal, round, and reactive to light.  Neck: Trachea normal, normal range of motion and full passive range of motion without pain. Neck supple. No JVD present. Carotid bruit is not present. No thyromegaly present.  Cardiovascular: Normal rate, regular rhythm, normal heart sounds and intact distal pulses.  Exam reveals no gallop and no friction rub.   No murmur heard. Pulmonary/Chest: Effort normal and breath sounds normal. No respiratory distress.  Abdominal: Soft. Bowel sounds are normal. She exhibits no distension and no mass. There is no tenderness.  Musculoskeletal: Normal range of motion. Edema: +1 non-pitting edema to left ankle and lower leg, 2+ to right   Lymphadenopathy:    She has no cervical adenopathy.  Neurological: She is alert and oriented to person, place, and time. She has normal reflexes.  Skin: Skin is warm and dry.  Psychiatric: She has a normal mood and affect. Her behavior is normal. Judgment and thought content normal.  Vitals reviewed.   BP 157/70 mmHg  Pulse 63  Temp(Src) 96.9 F (36.1 C) (Oral)  Ht 5' 3"  (1.6 m)  Wt 167 lb (75.751 kg)  BMI 29.59 kg/m2       Assessment & Plan:    1. Essential hypertension Do not add salt to diet - CMP14+EGFR  2. Other specified hypothyroidism - Thyroid Panel With TSH  3. Osteoporosis Weight bearing exercises  4. Hyperlipidemia Low fat - NMR, lipoprofile  5. BMI 30.0-30.9,adult Discussed diet and exercise  for person with BMI >25 Will recheck weight in 3-6 months    prevnar 13 today Labs  pending Health maintenance reviewed Diet and exercise encouraged Continue all meds Follow up  In 3 month   [place], FNP

## 2014-06-14 DIAGNOSIS — M810 Age-related osteoporosis without current pathological fracture: Secondary | ICD-10-CM | POA: Diagnosis not present

## 2014-06-14 DIAGNOSIS — Z6830 Body mass index (BMI) 30.0-30.9, adult: Secondary | ICD-10-CM | POA: Diagnosis not present

## 2014-06-14 DIAGNOSIS — E038 Other specified hypothyroidism: Secondary | ICD-10-CM | POA: Diagnosis not present

## 2014-06-14 DIAGNOSIS — Z23 Encounter for immunization: Secondary | ICD-10-CM

## 2014-06-14 DIAGNOSIS — E785 Hyperlipidemia, unspecified: Secondary | ICD-10-CM | POA: Diagnosis not present

## 2014-06-14 DIAGNOSIS — I10 Essential (primary) hypertension: Secondary | ICD-10-CM | POA: Diagnosis not present

## 2014-06-14 LAB — NMR, LIPOPROFILE
CHOLESTEROL: 175 mg/dL (ref 100–199)
HDL CHOLESTEROL BY NMR: 68 mg/dL (ref >39)
HDL Particle Number: 32.8 umol/L (ref >=30.5)
LDL PARTICLE NUMBER: 953 nmol/L (ref <1000)
LDL Size: 21.3 nm (ref >20.5)
LDL-C: 85 mg/dL (ref 0–99)
LP-IR Score: <25 (ref <=45)
Small LDL Particle Number: 354 nmol/L (ref <=527)
Triglycerides by NMR: 108 mg/dL (ref 0–149)

## 2014-06-14 LAB — THYROID PANEL WITH TSH
Free Thyroxine Index: 2.3 (ref 1.2–4.9)
T3 Uptake Ratio: 30 % (ref 24–39)
T4 TOTAL: 7.8 ug/dL (ref 4.5–12.0)
TSH: 1.23 u[IU]/mL (ref 0.450–4.500)

## 2014-06-14 LAB — CMP14+EGFR
ALT: 12 IU/L (ref 0–32)
AST: 17 IU/L (ref 0–40)
Albumin/Globulin Ratio: 1.9 (ref 1.1–2.5)
Albumin: 4.4 g/dL (ref 3.5–4.8)
Alkaline Phosphatase: 56 IU/L (ref 39–117)
BUN / CREAT RATIO: 27 — AB (ref 11–26)
BUN: 33 mg/dL — ABNORMAL HIGH (ref 8–27)
Bilirubin Total: 0.5 mg/dL (ref 0.0–1.2)
CO2: 21 mmol/L (ref 18–29)
CREATININE: 1.24 mg/dL — AB (ref 0.57–1.00)
Calcium: 9.3 mg/dL (ref 8.7–10.3)
Chloride: 105 mmol/L (ref 97–108)
GFR, EST AFRICAN AMERICAN: 48 mL/min/{1.73_m2} — AB (ref >59)
GFR, EST NON AFRICAN AMERICAN: 41 mL/min/{1.73_m2} — AB (ref >59)
GLOBULIN, TOTAL: 2.3 g/dL (ref 1.5–4.5)
Glucose: 100 mg/dL — ABNORMAL HIGH (ref 65–99)
POTASSIUM: 5 mmol/L (ref 3.5–5.2)
SODIUM: 140 mmol/L (ref 134–144)
Total Protein: 6.7 g/dL (ref 6.0–8.5)

## 2014-06-14 NOTE — Addendum Note (Signed)
Addended by: Rolena Infante on: 06/14/2014 09:12 AM   Modules accepted: Orders

## 2014-08-01 DIAGNOSIS — H25813 Combined forms of age-related cataract, bilateral: Secondary | ICD-10-CM | POA: Diagnosis not present

## 2014-08-01 DIAGNOSIS — I10 Essential (primary) hypertension: Secondary | ICD-10-CM | POA: Diagnosis not present

## 2014-08-01 DIAGNOSIS — H35033 Hypertensive retinopathy, bilateral: Secondary | ICD-10-CM | POA: Diagnosis not present

## 2014-08-13 ENCOUNTER — Ambulatory Visit (INDEPENDENT_AMBULATORY_CARE_PROVIDER_SITE_OTHER): Payer: Medicare Other

## 2014-08-13 ENCOUNTER — Encounter: Payer: Self-pay | Admitting: Family Medicine

## 2014-08-13 ENCOUNTER — Ambulatory Visit (INDEPENDENT_AMBULATORY_CARE_PROVIDER_SITE_OTHER): Payer: Medicare Other | Admitting: Family Medicine

## 2014-08-13 VITALS — BP 136/57 | HR 60 | Temp 97.7°F | Ht 63.0 in | Wt 164.0 lb

## 2014-08-13 DIAGNOSIS — J209 Acute bronchitis, unspecified: Secondary | ICD-10-CM

## 2014-08-13 DIAGNOSIS — J301 Allergic rhinitis due to pollen: Secondary | ICD-10-CM | POA: Diagnosis not present

## 2014-08-13 DIAGNOSIS — R05 Cough: Secondary | ICD-10-CM

## 2014-08-13 DIAGNOSIS — R059 Cough, unspecified: Secondary | ICD-10-CM

## 2014-08-13 LAB — POCT CBC
Granulocyte percent: 66.9 %G (ref 37–80)
HCT, POC: 35.9 % — AB (ref 37.7–47.9)
Hemoglobin: 11.4 g/dL — AB (ref 12.2–16.2)
LYMPH, POC: 2.2 (ref 0.6–3.4)
MCH, POC: 29.7 pg (ref 27–31.2)
MCHC: 31.8 g/dL (ref 31.8–35.4)
MCV: 93.4 fL (ref 80–97)
MPV: 7.9 fL (ref 0–99.8)
PLATELET COUNT, POC: 176 10*3/uL (ref 142–424)
POC GRANULOCYTE: 6 (ref 2–6.9)
POC LYMPH %: 24.2 % (ref 10–50)
RBC: 3.84 M/uL — AB (ref 4.04–5.48)
RDW, POC: 12.2 %
WBC: 8.9 10*3/uL (ref 4.6–10.2)

## 2014-08-13 MED ORDER — AZITHROMYCIN 250 MG PO TABS: ORAL_TABLET | ORAL | Status: DC

## 2014-08-13 MED ORDER — FLUTICASONE PROPIONATE 50 MCG/ACT NA SUSP: 2.0000 | Freq: Every day | NASAL | Status: DC

## 2014-08-13 NOTE — Patient Instructions (Signed)
Drink plenty of fluids Take Tylenol for aches pains and fever Use nasal saline spray 3-4 times daily each nostril Take Mucinex maximum strength, blue and white in color, 1 twice daily with a large glass of water for cough and congestion Take the Z-Pak as directed

## 2014-08-13 NOTE — Progress Notes (Signed)
Subjective:    Patient ID: Audrey Manning, female    DOB: [birth year], 81 y.o.   MRN: [id number]  HPI Patient here today for cough that started about 2 weeks ago. She is also having sinus trouble and drainage. She has head congestion and pressure. She has been around some children and their parents have had some the same problems. She also says she gets this about twice a year. The last episode was last fall. She has had some thick drainage and most of the drainage is clear in color.       Patient Active Problem List   Diagnosis Date Noted  . Other specified hypothyroidism 08/14/2013  . BMI 30.0-30.9,adult 08/14/2013  . Hypertension 05/20/2010  . Vertigo 05/20/2010  . Hyperlipidemia 05/20/2010  . Allergic rhinitis 05/20/2010  . Dyspnea on exertion 05/20/2010  . Osteoporosis 05/20/2010   Outpatient Encounter Prescriptions as of 08/13/2014  Medication Sig  . alendronate (FOSAMAX) 70 MG tablet Take 1 tablet (70 mg total) by mouth every 7 (seven) days. Take with a full glass of water on an empty stomach.  Marland Kitchen amLODipine (NORVASC) 5 MG tablet Take 1 tablet (5 mg total) by mouth daily.  . Calcium Carbonate-Vitamin D (CALCIUM 500 + D) 500-125 MG-UNIT TABS Take 500 mg by mouth daily.  . furosemide (LASIX) 20 MG tablet Take 1 tablet (20 mg total) by mouth daily.  Marland Kitchen levothyroxine (SYNTHROID, LEVOTHROID) 50 MCG tablet TAKE 1 TABLET BY MOUTH ONCE DAILY BEFORE  BREAKFAST  . losartan (COZAAR) 100 MG tablet Take 1 tablet (100 mg total) by mouth daily.  . metoprolol (LOPRESSOR) 50 MG tablet Take 1 tablet (50 mg total) by mouth 2 (two) times daily.  . simvastatin (ZOCOR) 20 MG tablet Take 1 tablet (20 mg total) by mouth at bedtime.  . triamcinolone cream (KENALOG) 0.1 % Apply 1 application topically 2 (two) times daily.   No facility-administered encounter medications on file as of 08/13/2014.     Review of Systems  Constitutional: Negative.  Negative for fever.  HENT: Positive for congestion,  postnasal drip and sinus pressure.   Eyes: Negative.   Respiratory: Positive for cough.   Cardiovascular: Negative.   Gastrointestinal: Negative.   Endocrine: Negative.   Genitourinary: Negative.   Musculoskeletal: Negative.   Skin: Negative.   Allergic/Immunologic: Negative.   Neurological: Negative.   Hematological: Negative.   Psychiatric/Behavioral: Negative.        Objective:   Physical Exam  Constitutional: She is oriented to person, place, and time. She appears well-developed and well-nourished. No distress.  HENT:  Head: Normocephalic and atraumatic.  Right Ear: External ear normal.  Left Ear: External ear normal.  Mouth/Throat: Oropharynx is clear and moist.  Nasal pallor  Eyes: Conjunctivae and EOM are normal. Pupils are equal, round, and reactive to light. Right eye exhibits no discharge. Left eye exhibits no discharge. No scleral icterus.  Neck: Normal range of motion. Neck supple. No thyromegaly present.  Cardiovascular: Normal rate and regular rhythm.  Exam reveals no friction rub.   No murmur heard. Pulmonary/Chest: Effort normal and breath sounds normal. No respiratory distress. She has no wheezes. She has no rales.  Upper bronchial raspy cough  Musculoskeletal: Normal range of motion.  Lymphadenopathy:    She has no cervical adenopathy.  Neurological: She is alert and oriented to person, place, and time.  Skin: Skin is warm and dry. No rash noted.  Psychiatric: She has a normal mood and affect. Her behavior is normal. Judgment and  thought content normal.  Nursing note and vitals reviewed.   BP 136/57 mmHg  Pulse 60  Temp(Src) 97.7 F (36.5 C) (Oral)  Ht 5\' 3"  (1.6 m)  Wt 164 lb (74.39 kg)  BMI 29.06 kg/m2 Results for orders placed or performed in visit on 08/13/14  POCT CBC  Result Value Ref Range   WBC 8.9 4.6 - 10.2 K/uL   Lymph, poc 2.2 0.6 - 3.4   POC LYMPH PERCENT 24.2 10 - 50 %L   POC Granulocyte 6.0 2 - 6.9   Granulocyte percent 66.9 37 -  80 %G   RBC 3.84 (A) 4.04 - 5.48 M/uL   Hemoglobin 11.4 (A) 12.2 - 16.2 g/dL   HCT, POC 35.9 (A) 37.7 - 47.9 %   MCV 93.4 80 - 97 fL   MCH, POC 29.7 27 - 31.2 pg   MCHC 31.8 31.8 - 35.4 g/dL   RDW, POC 12.2 %   Platelet Count, POC 176 142 - 424 K/uL   MPV 7.9 0 - 99.8 fL   [facility] reading (PRIMARY) by  Dr.Daeja Helderman-no active disease on chest x-ray                                        Assessment & Plan:  1. Cough -Drink plenty of fluids and take Mucinex as directed - POCT CBC - DG Chest 2 View; Future  2. Allergic rhinitis due to pollen -Use prescription nose spray at bedtime 1-2 sprays each nostril -Also use nasal saline frequently during the day - fluticasone (FLONASE) 50 MCG/ACT nasal spray; Place 2 sprays into both nostrils daily.  Dispense: 16 g; Refill: 6  3. Acute bronchitis, unspecified organism -Drink plenty of fluids and take Tylenol if needed for aches pains and fever - azithromycin (ZITHROMAX) 250 MG tablet; 2 pills the first day then one daily for infection until completed  Dispense: 6 tablet; Refill: 0  Patient Instructions  Drink plenty of fluids Take Tylenol for aches pains and fever Use nasal saline spray 3-4 times daily each nostril Take Mucinex maximum strength, blue and white in color, 1 twice daily with a large glass of water for cough and congestion Take the Z-Pak as directed   Arrie Senate MD

## 2014-09-12 ENCOUNTER — Encounter: Payer: Self-pay | Admitting: Nurse Practitioner

## 2014-09-12 ENCOUNTER — Ambulatory Visit (INDEPENDENT_AMBULATORY_CARE_PROVIDER_SITE_OTHER): Payer: Medicare Other | Admitting: Nurse Practitioner

## 2014-09-12 VITALS — BP 141/68 | HR 60 | Temp 97.4°F | Ht 63.0 in | Wt 162.0 lb

## 2014-09-12 DIAGNOSIS — Z6828 Body mass index (BMI) 28.0-28.9, adult: Secondary | ICD-10-CM

## 2014-09-12 DIAGNOSIS — E038 Other specified hypothyroidism: Secondary | ICD-10-CM

## 2014-09-12 DIAGNOSIS — R0609 Other forms of dyspnea: Secondary | ICD-10-CM | POA: Diagnosis not present

## 2014-09-12 DIAGNOSIS — M810 Age-related osteoporosis without current pathological fracture: Secondary | ICD-10-CM | POA: Diagnosis not present

## 2014-09-12 DIAGNOSIS — E785 Hyperlipidemia, unspecified: Secondary | ICD-10-CM | POA: Diagnosis not present

## 2014-09-12 DIAGNOSIS — I10 Essential (primary) hypertension: Secondary | ICD-10-CM

## 2014-09-12 MED ORDER — FUROSEMIDE 20 MG PO TABS: 20.0000 mg | ORAL_TABLET | Freq: Every day | ORAL | Status: DC

## 2014-09-12 MED ORDER — LEVOTHYROXINE SODIUM 50 MCG PO TABS: ORAL_TABLET | ORAL | Status: DC

## 2014-09-12 MED ORDER — LOSARTAN POTASSIUM 100 MG PO TABS: 100.0000 mg | ORAL_TABLET | Freq: Every day | ORAL | Status: DC

## 2014-09-12 MED ORDER — AMLODIPINE BESYLATE 5 MG PO TABS: 5.0000 mg | ORAL_TABLET | Freq: Every day | ORAL | Status: DC

## 2014-09-12 MED ORDER — METOPROLOL TARTRATE 50 MG PO TABS: 50.0000 mg | ORAL_TABLET | Freq: Two times a day (BID) | ORAL | Status: DC

## 2014-09-12 MED ORDER — SIMVASTATIN 20 MG PO TABS: 20.0000 mg | ORAL_TABLET | Freq: Every day | ORAL | Status: DC

## 2014-09-12 MED ORDER — ALENDRONATE SODIUM 70 MG PO TABS: 70.0000 mg | ORAL_TABLET | ORAL | Status: DC

## 2014-09-12 NOTE — Patient Instructions (Signed)

## 2014-09-12 NOTE — Progress Notes (Signed)
Subjective:    Patient ID: Audrey Manning, female    DOB: 1932-07-06, 81 y.o.   MRN: [id number]  Patient in today for follow of chronic medical problems- no complaints today.  Hypertension This is a chronic problem. The current episode started more than 1 year ago. The problem is unchanged. The problem is controlled. Pertinent negatives include no headaches, neck pain, palpitations or shortness of breath. Risk factors for coronary artery disease include dyslipidemia, obesity and post-menopausal state. The current treatment provides moderate improvement. Compliance problems include diet and exercise.  Hypertensive end-organ damage includes a thyroid problem.  Hyperlipidemia This is a chronic problem. The current episode started more than 1 year ago. The problem is controlled. Recent lipid tests were reviewed and are normal. Exacerbating diseases include hypothyroidism and obesity. She has no history of diabetes. Pertinent negatives include no myalgias or shortness of breath. Current antihyperlipidemic treatment includes statins. The current treatment provides no improvement of lipids. Compliance problems include adherence to diet and adherence to exercise.  Risk factors for coronary artery disease include dyslipidemia, hypertension, obesity and post-menopausal.  Thyroid Problem Patient reports no cold intolerance, diaphoresis, diarrhea, menstrual problem or palpitations. Her past medical history is significant for hyperlipidemia. There is no history of diabetes.  osteoporosis Fosamax weekly- No C/o side effects.     Review of Systems  Constitutional: Negative for diaphoresis.  Respiratory: Negative for shortness of breath.   Cardiovascular: Negative for palpitations.  Gastrointestinal: Negative for diarrhea.  Endocrine: Negative for cold intolerance.  Genitourinary: Negative for menstrual problem.  Musculoskeletal: Negative for myalgias and neck pain.  Neurological: Negative.  Negative for  headaches.  Psychiatric/Behavioral: Negative.   All other systems reviewed and are negative.      Objective:   Physical Exam  Constitutional: She is oriented to person, place, and time. She appears well-developed and well-nourished. No distress.  HENT:  Right Ear: External ear normal.  Left Ear: External ear normal.  Nose: Nose normal.  Mouth/Throat: Oropharynx is clear and moist. No oropharyngeal exudate.  Eyes: Conjunctivae and EOM are normal. Pupils are equal, round, and reactive to light.  Neck: Trachea normal, normal range of motion and full passive range of motion without pain. Neck supple. No JVD present. Carotid bruit is not present. No thyromegaly present.  Cardiovascular: Normal rate, regular rhythm, normal heart sounds and intact distal pulses.  Exam reveals no gallop and no friction rub.   No murmur heard. Pulmonary/Chest: Effort normal and breath sounds normal. No respiratory distress.  Abdominal: Soft. Bowel sounds are normal. She exhibits no distension and no mass. There is no tenderness.  Musculoskeletal: Normal range of motion. Edema: +1 non-pitting edema to left ankle and lower leg, 2+ to right   Lymphadenopathy:    She has no cervical adenopathy.  Neurological: She is alert and oriented to person, place, and time. She has normal reflexes.  Skin: Skin is warm and dry.  Psychiatric: She has a normal mood and affect. Her behavior is normal. Judgment and thought content normal.  Vitals reviewed.  BP 141/68 mmHg  Pulse 60  Temp(Src) 97.4 F (36.3 C) (Oral)  Ht 5' 3"  (1.6 m)  Wt 162 lb (73.483 kg)  BMI 28.70 kg/m2      Assessment & Plan:   1. Essential hypertension Do not add salt to diet - losartan (COZAAR) 100 MG tablet; Take 1 tablet (100 mg total) by mouth daily.  Dispense: 90 tablet; Refill: 1 - metoprolol (LOPRESSOR) 50 MG tablet; Take 1 tablet (50  mg total) by mouth 2 (two) times daily.  Dispense: 180 tablet; Refill: 1 - amLODipine (NORVASC) 5 MG  tablet; Take 1 tablet (5 mg total) by mouth daily.  Dispense: 90 tablet; Refill: 1 - furosemide (LASIX) 20 MG tablet; Take 1 tablet (20 mg total) by mouth daily.  Dispense: 90 tablet; Refill: 1 - CMP14+EGFR  2. Osteoporosis Weight bearing exercise - alendronate (FOSAMAX) 70 MG tablet; Take 1 tablet (70 mg total) by mouth every 7 (seven) days. Take with a full glass of water on an empty stomach.  Dispense: 12 tablet; Refill: 1  3. Other specified hypothyroidism - levothyroxine (SYNTHROID, LEVOTHROID) 50 MCG tablet; TAKE 1 TABLET BY MOUTH ONCE DAILY BEFORE  BREAKFAST  Dispense: 90 tablet; Refill: 1 - Thyroid Panel With TSH  4. Hyperlipidemia Low fat diet - simvastatin (ZOCOR) 20 MG tablet; Take 1 tablet (20 mg total) by mouth at bedtime.  Dispense: 90 tablet; Refill: 1 - Lipid panel  5. Dyspnea on exertion   6. BMI 28.0-28.9,adult Discussed diet and exercise for person with BMI >25 Will recheck weight in 3-6 months     Labs pending Health maintenance reviewed Diet and exercise encouraged Continue all meds Follow up  In 3 month   [place], FNP

## 2014-09-13 ENCOUNTER — Other Ambulatory Visit (INDEPENDENT_AMBULATORY_CARE_PROVIDER_SITE_OTHER): Payer: Medicare Other

## 2014-09-13 ENCOUNTER — Other Ambulatory Visit: Payer: Self-pay | Admitting: *Deleted

## 2014-09-13 DIAGNOSIS — E875 Hyperkalemia: Secondary | ICD-10-CM

## 2014-09-13 LAB — CMP14+EGFR
A/G RATIO: 2 (ref 1.1–2.5)
ALT: 12 IU/L (ref 0–32)
AST: 23 IU/L (ref 0–40)
Albumin: 4.6 g/dL (ref 3.5–4.8)
Alkaline Phosphatase: 55 IU/L (ref 39–117)
BUN / CREAT RATIO: 23 (ref 11–26)
BUN: 37 mg/dL — AB (ref 8–27)
Bilirubin Total: 0.6 mg/dL (ref 0.0–1.2)
CHLORIDE: 102 mmol/L (ref 97–108)
CO2: 20 mmol/L (ref 18–29)
Calcium: 9.8 mg/dL (ref 8.7–10.3)
Creatinine, Ser: 1.6 mg/dL — ABNORMAL HIGH (ref 0.57–1.00)
GFR, EST AFRICAN AMERICAN: 35 mL/min/{1.73_m2} — AB (ref >59)
GFR, EST NON AFRICAN AMERICAN: 30 mL/min/{1.73_m2} — AB (ref >59)
GLOBULIN, TOTAL: 2.3 g/dL (ref 1.5–4.5)
GLUCOSE: 99 mg/dL (ref 65–99)
Potassium: 6.7 mmol/L (ref 3.5–5.2)
SODIUM: 137 mmol/L (ref 134–144)
Total Protein: 6.9 g/dL (ref 6.0–8.5)

## 2014-09-13 LAB — THYROID PANEL WITH TSH
Free Thyroxine Index: 2.6 (ref 1.2–4.9)
T3 Uptake Ratio: 31 % (ref 24–39)
T4 TOTAL: 8.5 ug/dL (ref 4.5–12.0)
TSH: 1.26 u[IU]/mL (ref 0.450–4.500)

## 2014-09-13 LAB — LIPID PANEL
CHOLESTEROL TOTAL: 142 mg/dL (ref 100–199)
Chol/HDL Ratio: 2 ratio units (ref 0.0–4.4)
HDL: 70 mg/dL (ref >39)
LDL Calculated: 54 mg/dL (ref 0–99)
Triglycerides: 88 mg/dL (ref 0–149)
VLDL Cholesterol Cal: 18 mg/dL (ref 5–40)

## 2014-09-14 ENCOUNTER — Other Ambulatory Visit: Payer: Self-pay | Admitting: Pharmacist

## 2014-09-14 DIAGNOSIS — I10 Essential (primary) hypertension: Secondary | ICD-10-CM

## 2014-09-14 DIAGNOSIS — E875 Hyperkalemia: Secondary | ICD-10-CM

## 2014-09-14 LAB — BMP8+EGFR
BUN / CREAT RATIO: 26 (ref 11–26)
BUN: 45 mg/dL — ABNORMAL HIGH (ref 8–27)
CALCIUM: 9.4 mg/dL (ref 8.7–10.3)
CHLORIDE: 101 mmol/L (ref 97–108)
CO2: 22 mmol/L (ref 18–29)
Creatinine, Ser: 1.7 mg/dL — ABNORMAL HIGH (ref 0.57–1.00)
GFR calc Af Amer: 33 mL/min/{1.73_m2} — ABNORMAL LOW (ref >59)
GFR calc non Af Amer: 28 mL/min/{1.73_m2} — ABNORMAL LOW (ref >59)
GLUCOSE: 101 mg/dL — AB (ref 65–99)
Potassium: 6.8 mmol/L (ref 3.5–5.2)
Sodium: 137 mmol/L (ref 134–144)

## 2014-09-14 MED ORDER — SODIUM POLYSTYRENE SULFONATE 15 GM/60ML PO SUSP: ORAL | Status: DC

## 2014-09-15 ENCOUNTER — Other Ambulatory Visit: Payer: Medicare Other

## 2014-09-15 DIAGNOSIS — E875 Hyperkalemia: Secondary | ICD-10-CM | POA: Diagnosis not present

## 2014-09-16 LAB — BMP8+EGFR
BUN/Creatinine Ratio: 28 — ABNORMAL HIGH (ref 11–26)
BUN: 42 mg/dL — AB (ref 8–27)
CALCIUM: 9.1 mg/dL (ref 8.7–10.3)
CO2: 20 mmol/L (ref 18–29)
CREATININE: 1.5 mg/dL — AB (ref 0.57–1.00)
Chloride: 101 mmol/L (ref 97–108)
GFR, EST AFRICAN AMERICAN: 38 mL/min/{1.73_m2} — AB (ref >59)
GFR, EST NON AFRICAN AMERICAN: 33 mL/min/{1.73_m2} — AB (ref >59)
GLUCOSE: 94 mg/dL (ref 65–99)
Potassium: 4.9 mmol/L (ref 3.5–5.2)
Sodium: 139 mmol/L (ref 134–144)

## 2014-09-17 ENCOUNTER — Telehealth: Payer: Self-pay | Admitting: *Deleted

## 2014-09-17 NOTE — Telephone Encounter (Signed)
Pt notified of results Verbalizes understanding 

## 2014-09-17 NOTE — Telephone Encounter (Signed)
-----   Message from [place], [place] sent at 09/16/2014  9:48 PM EDT ----- Serum creatinine improved and potassium is back to normal.  Recommend recheck in 2-3 days.  Continue to hold ARB until recheck. Please notify patient.

## 2014-09-19 ENCOUNTER — Other Ambulatory Visit (INDEPENDENT_AMBULATORY_CARE_PROVIDER_SITE_OTHER): Payer: Medicare Other

## 2014-09-19 DIAGNOSIS — R799 Abnormal finding of blood chemistry, unspecified: Secondary | ICD-10-CM

## 2014-09-19 NOTE — Progress Notes (Signed)
LAB WORK

## 2014-09-20 LAB — BMP8+EGFR
BUN/Creatinine Ratio: 32 — ABNORMAL HIGH (ref 11–26)
BUN: 41 mg/dL — ABNORMAL HIGH (ref 8–27)
CO2: 22 mmol/L (ref 18–29)
Calcium: 9.3 mg/dL (ref 8.7–10.3)
Chloride: 101 mmol/L (ref 97–108)
Creatinine, Ser: 1.3 mg/dL — ABNORMAL HIGH (ref 0.57–1.00)
GFR calc Af Amer: 45 mL/min/{1.73_m2} — ABNORMAL LOW (ref >59)
GFR calc non Af Amer: 39 mL/min/{1.73_m2} — ABNORMAL LOW (ref >59)
Glucose: 106 mg/dL — ABNORMAL HIGH (ref 65–99)
Potassium: 4.6 mmol/L (ref 3.5–5.2)
Sodium: 139 mmol/L (ref 134–144)

## 2014-11-01 ENCOUNTER — Ambulatory Visit (INDEPENDENT_AMBULATORY_CARE_PROVIDER_SITE_OTHER): Payer: Medicare Other | Admitting: Pediatrics

## 2014-11-01 ENCOUNTER — Encounter: Payer: Self-pay | Admitting: Pediatrics

## 2014-11-01 VITALS — Ht 62.0 in | Wt 164.0 lb

## 2014-11-01 DIAGNOSIS — Z0000 Encounter for general adult medical examination without abnormal findings: Secondary | ICD-10-CM
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
## 2014-11-23 ENCOUNTER — Ambulatory Visit (INDEPENDENT_AMBULATORY_CARE_PROVIDER_SITE_OTHER): Payer: Medicare Other

## 2014-11-23 DIAGNOSIS — Z23 Encounter for immunization: Secondary | ICD-10-CM

## 2014-12-10 ENCOUNTER — Encounter: Payer: Self-pay | Admitting: Pediatrics

## 2014-12-21 ENCOUNTER — Encounter: Payer: Self-pay | Admitting: Nurse Practitioner

## 2014-12-21 ENCOUNTER — Ambulatory Visit (INDEPENDENT_AMBULATORY_CARE_PROVIDER_SITE_OTHER): Payer: Medicare Other | Admitting: Nurse Practitioner

## 2014-12-21 VITALS — BP 132/82 | HR 58 | Temp 97.0°F | Ht 63.0 in | Wt 163.6 lb

## 2014-12-21 DIAGNOSIS — M810 Age-related osteoporosis without current pathological fracture: Secondary | ICD-10-CM

## 2014-12-21 DIAGNOSIS — Z6828 Body mass index (BMI) 28.0-28.9, adult: Secondary | ICD-10-CM

## 2014-12-21 DIAGNOSIS — E785 Hyperlipidemia, unspecified: Secondary | ICD-10-CM

## 2014-12-21 DIAGNOSIS — E038 Other specified hypothyroidism: Secondary | ICD-10-CM | POA: Diagnosis not present

## 2014-12-21 DIAGNOSIS — I10 Essential (primary) hypertension: Secondary | ICD-10-CM | POA: Diagnosis not present

## 2014-12-21 MED ORDER — LEVOTHYROXINE SODIUM 50 MCG PO TABS: ORAL_TABLET | ORAL | Status: DC

## 2014-12-21 NOTE — Progress Notes (Signed)
Subjective:    Patient ID: Audrey Manning, female    DOB: 15-Apr-1932, 81 y.o.   MRN: [id number]  Patient in today for follow of chronic medical problems- no complaints today.  Hypertension This is a chronic problem. The current episode started more than 1 year ago. The problem is unchanged. The problem is controlled. Pertinent negatives include no headaches, neck pain, palpitations or shortness of breath. Risk factors for coronary artery disease include dyslipidemia, obesity and post-menopausal state. The current treatment provides moderate improvement. Compliance problems include diet and exercise.  Hypertensive end-organ damage includes a thyroid problem.  Hyperlipidemia This is a chronic problem. The current episode started more than 1 year ago. The problem is controlled. Recent lipid tests were reviewed and are normal. Exacerbating diseases include hypothyroidism and obesity. She has no history of diabetes. Pertinent negatives include no myalgias or shortness of breath. Current antihyperlipidemic treatment includes statins. The current treatment provides no improvement of lipids. Compliance problems include adherence to diet and adherence to exercise.  Risk factors for coronary artery disease include dyslipidemia, hypertension, obesity and post-menopausal.  Thyroid Problem Patient reports no cold intolerance, diaphoresis, diarrhea, menstrual problem or palpitations. Her past medical history is significant for hyperlipidemia. There is no history of diabetes.  osteoporosis Fosamax weekly- No C/o side effects.     Review of Systems  Constitutional: Negative for diaphoresis.  Respiratory: Negative for shortness of breath.   Cardiovascular: Negative for palpitations.  Gastrointestinal: Negative for diarrhea.  Endocrine: Negative for cold intolerance.  Genitourinary: Negative for menstrual problem.  Musculoskeletal: Negative for myalgias and neck pain.  Neurological: Negative.  Negative for  headaches.  Psychiatric/Behavioral: Negative.   All other systems reviewed and are negative.      Objective:   Physical Exam  Constitutional: She is oriented to person, place, and time. She appears well-developed and well-nourished. No distress.  HENT:  Right Ear: External ear normal.  Left Ear: External ear normal.  Nose: Nose normal.  Mouth/Throat: Oropharynx is clear and moist. No oropharyngeal exudate.  Eyes: Conjunctivae and EOM are normal. Pupils are equal, round, and reactive to light.  Neck: Trachea normal, normal range of motion and full passive range of motion without pain. Neck supple. No JVD present. Carotid bruit is not present. No thyromegaly present.  Cardiovascular: Normal rate, regular rhythm, normal heart sounds and intact distal pulses.  Exam reveals no gallop and no friction rub.   No murmur heard. Pulmonary/Chest: Effort normal and breath sounds normal. No respiratory distress.  Abdominal: Soft. Bowel sounds are normal. She exhibits no distension and no mass. There is no tenderness.  Musculoskeletal: Normal range of motion. Edema: +1 non-pitting edema to left ankle and lower leg, 2+ to right   Lymphadenopathy:    She has no cervical adenopathy.  Neurological: She is alert and oriented to person, place, and time. She has normal reflexes.  Skin: Skin is warm and dry.  Psychiatric: She has a normal mood and affect. Her behavior is normal. Judgment and thought content normal.  Vitals reviewed.   BP 132/82 mmHg  Pulse 58  Temp(Src) 97 F (36.1 C) (Oral)  Ht 5' 3"  (1.6 m)  Wt 163 lb 9.6 oz (74.208 kg)  BMI 28.99 kg/m2       Assessment & Plan:   1. Essential hypertension Do not add salt to diet - CMP14+EGFR  2. Other specified hypothyroidism - levothyroxine (SYNTHROID, LEVOTHROID) 50 MCG tablet; TAKE 1 TABLET BY MOUTH ONCE DAILY BEFORE  BREAKFAST  Dispense: 90  tablet; Refill: 1  3. Osteoporosis Weight bearing exercises  4. Hyperlipidemia Low fat  diet - Lipid panel  5. BMI 28.0-28.9,adult Discussed diet and exercise for person with BMI >25 Will recheck weight in 3-6 months     Labs pending Health maintenance reviewed Diet and exercise encouraged Continue all meds Follow up  In 3 months   [place], FNP

## 2014-12-21 NOTE — Patient Instructions (Signed)

## 2014-12-22 LAB — CMP14+EGFR
ALT: 14 IU/L (ref 0–32)
AST: 26 IU/L (ref 0–40)
Albumin/Globulin Ratio: 2 (ref 1.1–2.5)
Albumin: 4.6 g/dL (ref 3.5–4.8)
Alkaline Phosphatase: 57 IU/L (ref 39–117)
BUN/Creatinine Ratio: 22 (ref 11–26)
BUN: 27 mg/dL (ref 8–27)
Bilirubin Total: 0.6 mg/dL (ref 0.0–1.2)
CALCIUM: 9 mg/dL (ref 8.7–10.3)
CO2: 25 mmol/L (ref 18–29)
Chloride: 101 mmol/L (ref 97–106)
Creatinine, Ser: 1.25 mg/dL — ABNORMAL HIGH (ref 0.57–1.00)
GFR calc Af Amer: 47 mL/min/{1.73_m2} — ABNORMAL LOW (ref >59)
GFR, EST NON AFRICAN AMERICAN: 41 mL/min/{1.73_m2} — AB (ref >59)
GLOBULIN, TOTAL: 2.3 g/dL (ref 1.5–4.5)
Glucose: 94 mg/dL (ref 65–99)
POTASSIUM: 5.1 mmol/L (ref 3.5–5.2)
SODIUM: 140 mmol/L (ref 136–144)
Total Protein: 6.9 g/dL (ref 6.0–8.5)

## 2014-12-22 LAB — LIPID PANEL
CHOLESTEROL TOTAL: 125 mg/dL (ref 100–199)
Chol/HDL Ratio: 2.1 ratio units (ref 0.0–4.4)
HDL: 60 mg/dL (ref >39)
LDL Calculated: 42 mg/dL (ref 0–99)
Triglycerides: 114 mg/dL (ref 0–149)
VLDL CHOLESTEROL CAL: 23 mg/dL (ref 5–40)

## 2014-12-24 ENCOUNTER — Encounter: Payer: Self-pay | Admitting: Nurse Practitioner

## 2014-12-24 DIAGNOSIS — N183 Chronic kidney disease, stage 3 unspecified: Secondary | ICD-10-CM | POA: Insufficient documentation

## 2014-12-28 ENCOUNTER — Other Ambulatory Visit: Payer: Self-pay | Admitting: Nurse Practitioner

## 2015-02-22 ENCOUNTER — Other Ambulatory Visit: Payer: Self-pay | Admitting: Nurse Practitioner

## 2015-03-27 ENCOUNTER — Ambulatory Visit (INDEPENDENT_AMBULATORY_CARE_PROVIDER_SITE_OTHER): Payer: Medicare Other | Admitting: Nurse Practitioner

## 2015-03-27 ENCOUNTER — Encounter: Payer: Self-pay | Admitting: Nurse Practitioner

## 2015-03-27 VITALS — BP 137/69 | HR 54 | Temp 97.1°F | Ht 63.0 in | Wt 163.0 lb

## 2015-03-27 DIAGNOSIS — E038 Other specified hypothyroidism: Secondary | ICD-10-CM

## 2015-03-27 DIAGNOSIS — E785 Hyperlipidemia, unspecified: Secondary | ICD-10-CM

## 2015-03-27 DIAGNOSIS — N183 Chronic kidney disease, stage 3 unspecified: Secondary | ICD-10-CM

## 2015-03-27 DIAGNOSIS — Z6828 Body mass index (BMI) 28.0-28.9, adult: Secondary | ICD-10-CM

## 2015-03-27 DIAGNOSIS — I10 Essential (primary) hypertension: Secondary | ICD-10-CM | POA: Diagnosis not present

## 2015-03-27 DIAGNOSIS — H8113 Benign paroxysmal vertigo, bilateral: Secondary | ICD-10-CM | POA: Diagnosis not present

## 2015-03-27 MED ORDER — METOPROLOL TARTRATE 50 MG PO TABS: 50.0000 mg | ORAL_TABLET | Freq: Two times a day (BID) | ORAL | Status: DC

## 2015-03-27 MED ORDER — LEVOTHYROXINE SODIUM 50 MCG PO TABS: ORAL_TABLET | ORAL | Status: DC

## 2015-03-27 MED ORDER — AMLODIPINE BESYLATE 5 MG PO TABS: ORAL_TABLET | ORAL | Status: DC

## 2015-03-27 MED ORDER — MECLIZINE HCL 25 MG PO TABS: 25.0000 mg | ORAL_TABLET | Freq: Three times a day (TID) | ORAL | Status: DC | PRN

## 2015-03-27 MED ORDER — SIMVASTATIN 20 MG PO TABS: 20.0000 mg | ORAL_TABLET | Freq: Every day | ORAL | Status: DC

## 2015-03-27 MED ORDER — FUROSEMIDE 20 MG PO TABS: 20.0000 mg | ORAL_TABLET | Freq: Every day | ORAL | Status: DC

## 2015-03-27 NOTE — Progress Notes (Signed)
Subjective:    Patient ID: Audrey Manning, female    DOB: [birth year], 82 y.o.   MRN: [id number]  Patient here today for follow up of chronic medical problems.  Outpatient Encounter Prescriptions as of 03/27/2015  Medication Sig  . alendronate (FOSAMAX) 70 MG tablet Take 1 tablet (70 mg total) by mouth every 7 (seven) days. Take with a full glass of water on an empty stomach.  Marland Kitchen amLODipine (NORVASC) 5 MG tablet TAKE ONE (1) TABLET EACH DAY  . Calcium Carbonate-Vitamin D (CALCIUM 500 + D) 500-125 MG-UNIT TABS Take 500 mg by mouth daily.  . fluticasone (FLONASE) 50 MCG/ACT nasal spray Place 2 sprays into both nostrils daily.  . furosemide (LASIX) 20 MG tablet Take 1 tablet (20 mg total) by mouth daily.  Marland Kitchen levothyroxine (SYNTHROID, LEVOTHROID) 50 MCG tablet TAKE 1 TABLET BY MOUTH ONCE DAILY BEFORE  BREAKFAST  . metoprolol (LOPRESSOR) 50 MG tablet Take 1 tablet (50 mg total) by mouth 2 (two) times daily.  . simvastatin (ZOCOR) 20 MG tablet Take 1 tablet (20 mg total) by mouth at bedtime.  . sodium polystyrene (SPS) 15 GM/60ML suspension Take 15 gram immediately, then repeat in 4 hours.  . triamcinolone cream (KENALOG) 0.1 % APPLY TWICE DAILY   No facility-administered encounter medications on file as of 03/27/2015.    * C/O some dizziness for the last week- mainly when she is getting up to walk- some dizziness when she rolls over in bed  Hypertension This is a chronic problem. The current episode started more than 1 year ago. The problem is unchanged. The problem is controlled. Pertinent negatives include no headaches, neck pain, palpitations or shortness of breath. Risk factors for coronary artery disease include dyslipidemia, obesity and post-menopausal state. The current treatment provides moderate improvement. Compliance problems include diet and exercise.  Hypertensive end-organ damage includes a thyroid problem.  Hyperlipidemia This is a chronic problem. The current episode started more than  1 year ago. The problem is controlled. Recent lipid tests were reviewed and are normal. Exacerbating diseases include hypothyroidism and obesity. She has no history of diabetes. Pertinent negatives include no myalgias or shortness of breath. Current antihyperlipidemic treatment includes statins. The current treatment provides no improvement of lipids. Compliance problems include adherence to diet and adherence to exercise.  Risk factors for coronary artery disease include dyslipidemia, hypertension, obesity and post-menopausal.  Thyroid Problem Patient reports no cold intolerance, diaphoresis, diarrhea, menstrual problem or palpitations. Her past medical history is significant for hyperlipidemia. There is no history of diabetes.  osteoporosis Fosamax weekly- No C/o side effects.  CKD Currently just watching labs- was told to avoid NSAIDS  Review of Systems  Constitutional: Negative for diaphoresis.  Respiratory: Negative for shortness of breath.   Cardiovascular: Negative for palpitations.  Gastrointestinal: Negative for diarrhea.  Endocrine: Negative for cold intolerance.  Genitourinary: Negative for menstrual problem.  Musculoskeletal: Negative for myalgias and neck pain.  Neurological: Negative.  Negative for headaches.  Psychiatric/Behavioral: Negative.   All other systems reviewed and are negative.      Objective:   Physical Exam  Constitutional: She is oriented to person, place, and time. She appears well-developed and well-nourished. No distress.  HENT:  Right Ear: External ear normal.  Left Ear: External ear normal.  Nose: Nose normal.  Mouth/Throat: Oropharynx is clear and moist. No oropharyngeal exudate.  Eyes: Conjunctivae and EOM are normal. Pupils are equal, round, and reactive to light.  Neck: Trachea normal, normal range of motion and full passive  range of motion without pain. Neck supple. No JVD present. Carotid bruit is not present. No thyromegaly present.   Cardiovascular: Normal rate, regular rhythm, normal heart sounds and intact distal pulses.  Exam reveals no gallop and no friction rub.   No murmur heard. Pulmonary/Chest: Effort normal and breath sounds normal. No respiratory distress.  Abdominal: Soft. Bowel sounds are normal. She exhibits no distension and no mass. There is no tenderness.  Musculoskeletal: Normal range of motion. Edema: +1 non-pitting edema to left ankle and lower leg, 2+ to right   Lymphadenopathy:    She has no cervical adenopathy.  Neurological: She is alert and oriented to person, place, and time. She has normal reflexes.  Skin: Skin is warm and dry.  Psychiatric: She has a normal mood and affect. Her behavior is normal. Judgment and thought content normal.  Vitals reviewed.   BP 137/69 mmHg  Pulse 54  Temp(Src) 97.1 F (36.2 C) (Oral)  Ht 5' 3"  (1.6 m)  Wt 163 lb (73.936 kg)  BMI 28.88 kg/m2        Assessment & Plan:  1. Essential hypertension Do not add salt to diet - furosemide (LASIX) 20 MG tablet; Take 1 tablet (20 mg total) by mouth daily.  Dispense: 90 tablet; Refill: 1 - metoprolol (LOPRESSOR) 50 MG tablet; Take 1 tablet (50 mg total) by mouth 2 (two) times daily.  Dispense: 180 tablet; Refill: 1 - amLODipine (NORVASC) 5 MG tablet; TAKE ONE (1) TABLET EACH DAY  Dispense: 90 tablet; Refill: 1 - CMP14+EGFR  2. Other specified hypothyroidism - levothyroxine (SYNTHROID, LEVOTHROID) 50 MCG tablet; TAKE 1 TABLET BY MOUTH ONCE DAILY BEFORE  BREAKFAST  Dispense: 90 tablet; Refill: 1 - Thyroid Panel With TSH  3. Hyperlipidemia Low fat diet - simvastatin (ZOCOR) 20 MG tablet; Take 1 tablet (20 mg total) by mouth at bedtime.  Dispense: 90 tablet; Refill: 1 - Lipid panel  4. BMI 28.0-28.9,adult Discussed diet and exercise for person with BMI >25 Will recheck weight in 3-6 months  5. CKD (chronic kidney disease) stage 3, GFR 30-59 ml/min Will continue  To watch labs  6. Benign paroxysmal  positional vertigo, bilateral - meclizine (ANTIVERT) 25 MG tablet; Take 1 tablet (25 mg total) by mouth 3 (three) times daily as needed for dizziness.  Dispense: 30 tablet; Refill: 0    Labs pending Health maintenance reviewed Diet and exercise encouraged Continue all meds Follow up  In 3 month   [place], FNP

## 2015-03-27 NOTE — Patient Instructions (Signed)

## 2015-03-28 LAB — CMP14+EGFR
ALBUMIN: 4.7 g/dL (ref 3.5–4.7)
ALT: 16 IU/L (ref 0–32)
AST: 25 IU/L (ref 0–40)
Albumin/Globulin Ratio: 1.9 (ref 1.1–2.5)
Alkaline Phosphatase: 61 IU/L (ref 39–117)
BILIRUBIN TOTAL: 0.6 mg/dL (ref 0.0–1.2)
BUN / CREAT RATIO: 19 (ref 11–26)
BUN: 25 mg/dL (ref 8–27)
CHLORIDE: 99 mmol/L (ref 96–106)
CO2: 25 mmol/L (ref 18–29)
CREATININE: 1.35 mg/dL — AB (ref 0.57–1.00)
Calcium: 9.4 mg/dL (ref 8.7–10.3)
GFR, EST AFRICAN AMERICAN: 43 mL/min/{1.73_m2} — AB (ref >59)
GFR, EST NON AFRICAN AMERICAN: 37 mL/min/{1.73_m2} — AB (ref >59)
GLUCOSE: 96 mg/dL (ref 65–99)
Globulin, Total: 2.5 g/dL (ref 1.5–4.5)
Potassium: 4.5 mmol/L (ref 3.5–5.2)
Sodium: 139 mmol/L (ref 134–144)
TOTAL PROTEIN: 7.2 g/dL (ref 6.0–8.5)

## 2015-03-28 LAB — THYROID PANEL WITH TSH
Free Thyroxine Index: 2.3 (ref 1.2–4.9)
T3 UPTAKE RATIO: 28 % (ref 24–39)
T4 TOTAL: 8.1 ug/dL (ref 4.5–12.0)
TSH: 2.19 u[IU]/mL (ref 0.450–4.500)

## 2015-03-28 LAB — LIPID PANEL
CHOLESTEROL TOTAL: 154 mg/dL (ref 100–199)
Chol/HDL Ratio: 2.4 ratio units (ref 0.0–4.4)
HDL: 63 mg/dL (ref >39)
LDL Calculated: 60 mg/dL (ref 0–99)
TRIGLYCERIDES: 153 mg/dL — AB (ref 0–149)
VLDL CHOLESTEROL CAL: 31 mg/dL (ref 5–40)

## 2015-06-14 ENCOUNTER — Encounter (INDEPENDENT_AMBULATORY_CARE_PROVIDER_SITE_OTHER): Payer: Self-pay

## 2015-06-14 ENCOUNTER — Encounter: Payer: Self-pay | Admitting: Family

## 2015-06-14 ENCOUNTER — Ambulatory Visit (INDEPENDENT_AMBULATORY_CARE_PROVIDER_SITE_OTHER): Payer: Medicare Other | Admitting: Family

## 2015-06-14 VITALS — BP 153/75 | HR 70 | Temp 97.3°F | Wt 171.8 lb

## 2015-06-14 DIAGNOSIS — J209 Acute bronchitis, unspecified: Secondary | ICD-10-CM

## 2015-06-14 MED ORDER — AZITHROMYCIN 250 MG PO TABS: ORAL_TABLET | ORAL | Status: DC

## 2015-06-14 MED ORDER — FLUTICASONE PROPIONATE 50 MCG/ACT NA SUSP
2.0000 | Freq: Every day | NASAL | Status: DC
Start: 2015-06-14 — End: 2015-07-11

## 2015-06-14 MED ORDER — FLUTICASONE PROPIONATE 50 MCG/ACT NA SUSP: 2.0000 | Freq: Every day | NASAL | Status: DC

## 2015-06-14 MED ORDER — PREDNISONE 10 MG (21) PO TBPK: 10.0000 mg | ORAL_TABLET | Freq: Every day | ORAL | Status: DC

## 2015-06-14 NOTE — Patient Instructions (Addendum)
Acute Bronchitis Bronchitis is inflammation of the airways that extend from the windpipe into the lungs (bronchi). The inflammation often causes mucus to develop. This leads to a cough, which is the most common symptom of bronchitis.  In acute bronchitis, the condition usually develops suddenly and goes away over time, usually in a couple weeks. Smoking, allergies, and asthma can make bronchitis worse. Repeated episodes of bronchitis may cause further lung problems.  CAUSES Acute bronchitis is most often caused by the same virus that causes a cold. The virus can spread from person to person (contagious) through coughing, sneezing, and touching contaminated objects. SIGNS AND SYMPTOMS   Cough.   Fever.   Coughing up mucus.   Body aches.   Chest congestion.   Chills.   Shortness of breath.   Sore throat.  DIAGNOSIS  Acute bronchitis is usually diagnosed through a physical exam. Your health care provider will also ask you questions about your medical history. Tests, such as chest X-rays, are sometimes done to rule out other conditions.  TREATMENT  Acute bronchitis usually goes away in a couple weeks. Oftentimes, no medical treatment is necessary. Medicines are sometimes given for relief of fever or cough. Antibiotic medicines are usually not needed but may be prescribed in certain situations. In some cases, an inhaler may be recommended to help reduce shortness of breath and control the cough. A cool mist vaporizer may also be used to help thin bronchial secretions and make it easier to clear the chest.  HOME CARE INSTRUCTIONS  Get plenty of rest.   Drink enough fluids to keep your urine clear or pale yellow (unless you have a medical condition that requires fluid restriction). Increasing fluids may help thin your respiratory secretions (sputum) and reduce chest congestion, and it will prevent dehydration.   Take medicines only as directed by your health care provider.  If  you were prescribed an antibiotic medicine, finish it all even if you start to feel better.  Avoid smoking and secondhand smoke. Exposure to cigarette smoke or irritating chemicals will make bronchitis worse. If you are a smoker, consider using nicotine gum or skin patches to help control withdrawal symptoms. Quitting smoking will help your lungs heal faster.   Reduce the chances of another bout of acute bronchitis by washing your hands frequently, avoiding people with cold symptoms, and trying not to touch your hands to your mouth, nose, or eyes.   Keep all follow-up visits as directed by your health care provider.  SEEK MEDICAL CARE IF: Your symptoms do not improve after 1 week of treatment.  SEEK IMMEDIATE MEDICAL CARE IF:  You develop an increased fever or chills.   You have chest pain.   You have severe shortness of breath.  You have bloody sputum.   You develop dehydration.  You faint or repeatedly feel like you are going to pass out.  You develop repeated vomiting.  You develop a severe headache. MAKE SURE YOU:   Understand these instructions.  Will watch your condition.  Will get help right away if you are not doing well or get worse.   This information is not intended to replace advice given to you by your health care provider. Make sure you discuss any questions you have with your health care provider.   Document Released: 03/19/2004 Document Revised: 03/02/2014 Document Reviewed: 08/02/2012 Elsevier Interactive Patient Education 2016 [employer]  - Take meds as prescribed - Use a cool mist humidifier  -Use saline nose sprays frequently -Saline   irrigations of the nose can be very helpful if done frequently.  * 4X daily for 1 week*  * Use of a nettie pot can be helpful with this. Follow directions with this* -Force fluids -For any cough or congestion  Use plain Mucinex- regular strength or max strength is fine   * Children- consult with Pharmacist for  dosing -For fever or aces or pains- take tylenol or ibuprofen appropriate for age and weight.  * for fevers greater than 101 orally you may alternate ibuprofen and tylenol every  3 hours. -Throat lozenges if help    Aiya Keach, FNP  

## 2015-06-14 NOTE — Progress Notes (Signed)
Subjective:    Patient ID: Audrey Manning, female    DOB: 02-Jan-1933, 82 y.o.   MRN: [id number]  Cough This is a new problem. The current episode started 1 to 4 weeks ago. The problem has been unchanged. The problem occurs every few minutes. The cough is productive of sputum (thick clear). Associated symptoms include postnasal drip, rhinorrhea and wheezing. Pertinent negatives include no chills, ear congestion, ear pain, fever, headaches, myalgias, nasal congestion or shortness of breath. The symptoms are aggravated by lying down. She has tried rest and OTC cough suppressant for the symptoms. The treatment provided mild relief. There is no history of asthma or COPD.      Review of Systems  Constitutional: Negative.  Negative for fever and chills.  HENT: Positive for postnasal drip and rhinorrhea. Negative for ear pain.   Eyes: Negative.   Respiratory: Positive for cough and wheezing. Negative for shortness of breath.   Cardiovascular: Negative.  Negative for palpitations.  Gastrointestinal: Negative.   Endocrine: Negative.   Genitourinary: Negative.   Musculoskeletal: Negative.  Negative for myalgias.  Neurological: Negative.  Negative for headaches.  Hematological: Negative.   Psychiatric/Behavioral: Negative.   All other systems reviewed and are negative.      Objective:   Physical Exam  Constitutional: She is oriented to person, place, and time. She appears well-developed and well-nourished. No distress.  HENT:  Head: Normocephalic and atraumatic.  Right Ear: External ear normal.  Left Ear: External ear normal.  Mouth/Throat: Oropharynx is clear and moist.  Nasal passage erythemas with mild swelling    Eyes: Pupils are equal, round, and reactive to light.  Neck: Normal range of motion. Neck supple. No thyromegaly present.  Cardiovascular: Normal rate, regular rhythm, normal heart sounds and intact distal pulses.   No murmur heard. Pulmonary/Chest: Effort normal. No  respiratory distress. She has wheezes.  Abdominal: Soft. Bowel sounds are normal. She exhibits no distension. There is no tenderness.  Musculoskeletal: Normal range of motion. She exhibits no edema or tenderness.  Neurological: She is alert and oriented to person, place, and time. She has normal reflexes. No cranial nerve deficit.  Skin: Skin is warm and dry.  Psychiatric: She has a normal mood and affect. Her behavior is normal. Judgment and thought content normal.  Vitals reviewed.    BP 153/75 mmHg  Pulse 70  Temp(Src) 97.3 F (36.3 C) (Oral)  Wt 171 lb 12.8 oz (77.928 kg)      Assessment & Plan:  1. Acute bronchitis, unspecified organism -- Take meds as prescribed - Use a cool mist humidifier  -Use saline nose sprays frequently -Saline irrigations of the nose can be very helpful if done frequently.  * 4X daily for 1 week*  * Use of a nettie pot can be helpful with this. Follow directions with this* -Force fluids -For any cough or congestion  Use plain Mucinex- regular strength or max strength is fine   * Children- consult with Pharmacist for dosing -For fever or aces or pains- take tylenol or ibuprofen appropriate for age and weight.  * for fevers greater than 101 orally you may alternate ibuprofen and tylenol every  3 hours. -Throat lozenges if help - azithromycin (ZITHROMAX Z-PAK) 250 MG tablet; As directed  Dispense: 1 each; Refill: 0 - predniSONE (STERAPRED UNI-PAK 21 TAB) 10 MG (21) TBPK tablet; Take 1 tablet (10 mg total) by mouth daily. As directed x 6 days  Dispense: 21 tablet; Refill: 0 - fluticasone (FLONASE) 50 MCG/ACT  nasal spray; Place 2 sprays into both nostrils daily.  Dispense: 16 g; Refill: [place], FNP

## 2015-06-14 NOTE — Addendum Note (Signed)
Addended by: Evelina Dun A on: 06/14/2015 04:01 PM   Modules accepted: Orders

## 2015-07-11 ENCOUNTER — Ambulatory Visit (INDEPENDENT_AMBULATORY_CARE_PROVIDER_SITE_OTHER): Payer: Medicare Other | Admitting: Nurse Practitioner

## 2015-07-11 ENCOUNTER — Encounter: Payer: Self-pay | Admitting: Nurse Practitioner

## 2015-07-11 VITALS — BP 136/67 | HR 58 | Temp 97.0°F | Ht 63.0 in | Wt 170.0 lb

## 2015-07-11 DIAGNOSIS — N183 Chronic kidney disease, stage 3 unspecified: Secondary | ICD-10-CM

## 2015-07-11 DIAGNOSIS — M810 Age-related osteoporosis without current pathological fracture: Secondary | ICD-10-CM

## 2015-07-11 DIAGNOSIS — E785 Hyperlipidemia, unspecified: Secondary | ICD-10-CM

## 2015-07-11 DIAGNOSIS — Z1212 Encounter for screening for malignant neoplasm of rectum: Secondary | ICD-10-CM

## 2015-07-11 DIAGNOSIS — I10 Essential (primary) hypertension: Secondary | ICD-10-CM | POA: Diagnosis not present

## 2015-07-11 DIAGNOSIS — Z6828 Body mass index (BMI) 28.0-28.9, adult: Secondary | ICD-10-CM

## 2015-07-11 DIAGNOSIS — E038 Other specified hypothyroidism: Secondary | ICD-10-CM

## 2015-07-11 MED ORDER — ALENDRONATE SODIUM 70 MG PO TABS: 70.0000 mg | ORAL_TABLET | ORAL | Status: DC

## 2015-07-11 NOTE — Progress Notes (Signed)
Subjective:    Patient ID: Audrey Manning, female    DOB: 12/16/1932, 82 y.o.   MRN: [id number]  Patient here today for follow up of chronic medical problems. O changes since last visit- dizzines that she complained about at last visit has resolved.  Outpatient Encounter Prescriptions as of 07/11/2015  Medication Sig  . alendronate (FOSAMAX) 70 MG tablet Take 1 tablet (70 mg total) by mouth every 7 (seven) days. Take with a full glass of water on an empty stomach.  Marland Kitchen amLODipine (NORVASC) 5 MG tablet TAKE ONE (1) TABLET EACH DAY  . Calcium Carbonate-Vitamin D (CALCIUM 500 + D) 500-125 MG-UNIT TABS Take 500 mg by mouth daily.  . furosemide (LASIX) 20 MG tablet Take 1 tablet (20 mg total) by mouth daily.  Marland Kitchen levothyroxine (SYNTHROID, LEVOTHROID) 50 MCG tablet TAKE 1 TABLET BY MOUTH ONCE DAILY BEFORE  BREAKFAST  . meclizine (ANTIVERT) 25 MG tablet Take 1 tablet (25 mg total) by mouth 3 (three) times daily as needed for dizziness.  . metoprolol (LOPRESSOR) 50 MG tablet Take 1 tablet (50 mg total) by mouth 2 (two) times daily.  . simvastatin (ZOCOR) 20 MG tablet Take 1 tablet (20 mg total) by mouth at bedtime.  . triamcinolone cream (KENALOG) 0.1 % APPLY TWICE DAILY  . [DISCONTINUED] azithromycin (ZITHROMAX Z-PAK) 250 MG tablet As directed  . [DISCONTINUED] fluticasone (FLONASE) 50 MCG/ACT nasal spray Place 2 sprays into both nostrils daily.  . [DISCONTINUED] predniSONE (STERAPRED UNI-PAK 21 TAB) 10 MG (21) TBPK tablet Take 1 tablet (10 mg total) by mouth daily. As directed x 6 days   No facility-administered encounter medications on file as of 07/11/2015.     Hypertension This is a chronic problem. The current episode started more than 1 year ago. The problem is unchanged. The problem is controlled. Pertinent negatives include no headaches, neck pain, palpitations or shortness of breath. Risk factors for coronary artery disease include dyslipidemia, obesity and post-menopausal state. The current  treatment provides moderate improvement. Compliance problems include diet and exercise.  Hypertensive end-organ damage includes a thyroid problem.  Hyperlipidemia This is a chronic problem. The current episode started more than 1 year ago. The problem is controlled. Recent lipid tests were reviewed and are normal. Exacerbating diseases include hypothyroidism and obesity. She has no history of diabetes. Pertinent negatives include no myalgias or shortness of breath. Current antihyperlipidemic treatment includes statins. The current treatment provides no improvement of lipids. Compliance problems include adherence to diet and adherence to exercise.  Risk factors for coronary artery disease include dyslipidemia, hypertension, obesity and post-menopausal.  Thyroid Problem Patient reports no cold intolerance, diaphoresis, diarrhea, menstrual problem or palpitations. Her past medical history is significant for hyperlipidemia. There is no history of diabetes.  osteoporosis Fosamax weekly- No C/o side effects.  CKD Currently just watching labs- was told to avoid NSAIDS  Review of Systems  Constitutional: Negative for diaphoresis.  Respiratory: Negative for shortness of breath.   Cardiovascular: Negative for palpitations.  Gastrointestinal: Negative for diarrhea.  Endocrine: Negative for cold intolerance.  Genitourinary: Negative for menstrual problem.  Musculoskeletal: Negative for myalgias and neck pain.  Neurological: Negative.  Negative for headaches.  Psychiatric/Behavioral: Negative.   All other systems reviewed and are negative.      Objective:   Physical Exam  Constitutional: She is oriented to person, place, and time. She appears well-developed and well-nourished. No distress.  HENT:  Right Ear: External ear normal.  Left Ear: External ear normal.  Nose: Nose normal.  Mouth/Throat: Oropharynx is clear and moist. No oropharyngeal exudate.  Eyes: Conjunctivae and EOM are normal. Pupils  are equal, round, and reactive to light.  Neck: Trachea normal, normal range of motion and full passive range of motion without pain. Neck supple. No JVD present. Carotid bruit is not present. No thyromegaly present.  Cardiovascular: Normal rate, regular rhythm, normal heart sounds and intact distal pulses.  Exam reveals no gallop and no friction rub.   No murmur heard. Pulmonary/Chest: Effort normal and breath sounds normal. No respiratory distress.  Abdominal: Soft. Bowel sounds are normal. She exhibits no distension and no mass. There is no tenderness.  Musculoskeletal: Normal range of motion. Edema: +1 non-pitting edema to left ankle and lower leg, 2+ to right   Lymphadenopathy:    She has no cervical adenopathy.  Neurological: She is alert and oriented to person, place, and time. She has normal reflexes.  Skin: Skin is warm and dry.  Psychiatric: She has a normal mood and affect. Her behavior is normal. Judgment and thought content normal.  Vitals reviewed.   BP 136/67 mmHg  Pulse 58  Temp(Src) 97 F (36.1 C) (Oral)  Ht 5' 3" (1.6 m)  Wt 170 lb (77.111 kg)  BMI 30.12 kg/m2        Assessment & Plan:  1. Essential hypertension Do not add salt to diet - CMP14+EGFR  2. CKD (chronic kidney disease) stage 3, GFR 30-59 ml/min Currently watching labs  3. Hyperlipidemia Low fat diet - Lipid panel  4. BMI 28.0-28.9,adult Discussed diet and exercise for person with BMI >25 Will recheck weight in 3-6 months  5. Other specified hypothyroidism Continue thyroid meds  6. Osteoporosis Weight bearing exercises - alendronate (FOSAMAX) 70 MG tablet; Take 1 tablet (70 mg total) by mouth every 7 (seven) days. Take with a full glass of water on an empty stomach.  Dispense: 12 tablet; Refill: 1  7. Screening for malignant neoplasm of the rectum - Fecal occult blood, imunochemical; Future    Labs pending Health maintenance reviewed Diet and exercise encouraged Continue all  meds Follow up  In 3 months   [place], FNP

## 2015-07-11 NOTE — Patient Instructions (Signed)
Bone Health Bones protect organs, store calcium, and anchor muscles. Good health habits, such as eating nutritious foods and exercising regularly, are important for maintaining healthy bones. They can also help to prevent a condition that causes bones to lose density and become weak and brittle (osteoporosis). WHY IS BONE MASS IMPORTANT? Bone mass refers to the amount of bone tissue that you have. The higher your bone mass, the stronger your bones. An important step toward having healthy bones throughout life is to have strong and dense bones during childhood. A young adult who has a high bone mass is more likely to have a high bone mass later in life. Bone mass at its greatest it is called peak bone mass. A large decline in bone mass occurs in older adults. In women, it occurs about the time of menopause. During this time, it is important to practice good health habits, because if more bone is lost than what is replaced, the bones will become less healthy and more likely to break (fracture). If you find that you have a low bone mass, you may be able to prevent osteoporosis or further bone loss by changing your diet and lifestyle. HOW CAN I FIND OUT IF MY BONE MASS IS LOW? Bone mass can be measured with an X-ray test that is called a bone mineral density (BMD) test. This test is recommended for all women who are age 65 or older. It may also be recommended for men who are age 70 or older, or for people who are more likely to develop osteoporosis due to:  Having bones that break easily.  Having a long-term disease that weakens bones, such as kidney disease or rheumatoid arthritis.  Having menopause earlier than normal.  Taking medicine that weakens bones, such as steroids, thyroid hormones, or hormone treatment for breast cancer or prostate cancer.  Smoking.  Drinking three or more alcoholic drinks each day. WHAT ARE THE NUTRITIONAL RECOMMENDATIONS FOR HEALTHY BONES? To have healthy bones, you need  to get enough of the right minerals and vitamins. Most nutrition experts recommend getting these nutrients from the foods that you eat. Nutritional recommendations vary from person to person. Ask your health care provider what is healthy for you. Here are some general guidelines. Calcium Recommendations Calcium is the most important (essential) mineral for bone health. Most people can get enough calcium from their diet, but supplements may be recommended for people who are at risk for osteoporosis. Good sources of calcium include:  Dairy products, such as low-fat or nonfat milk, cheese, and yogurt.  Dark green leafy vegetables, such as bok choy and broccoli.  Calcium-fortified foods, such as orange juice, cereal, bread, soy beverages, and tofu products.  Nuts, such as almonds. Follow these recommended amounts for daily calcium intake:  Children, age 1-3: 700 mg.  Children, age 4-8: 1,000 mg.  Children, age 9-13: 1,300 mg.  Teens, age 14-18: 1,300 mg.  Adults, age 19-50: 1,000 mg.  Adults, age 51-70:  Men: 1,000 mg.  Women: 1,200 mg.  Adults, age 71 or older: 1,200 mg.  Pregnant and breastfeeding females:  Teens: 1,300 mg.  Adults: 1,000 mg. Vitamin D Recommendations Vitamin D is the most essential vitamin for bone health. It helps the body to absorb calcium. Sunlight stimulates the skin to make vitamin D, so be sure to get enough sunlight. If you live in a cold climate or you do not get outside often, your health care provider may recommend that you take vitamin D supplements. Good   sources of vitamin D in your diet include:  Egg yolks.  Saltwater fish.  Milk and cereal fortified with vitamin D. Follow these recommended amounts for daily vitamin D intake:  Children and teens, age 1-18: 600 international units.  Adults, age 50 or younger: 400-800 international units.  Adults, age 51 or older: 800-1,000 international units. Other Nutrients Other nutrients for bone  health include:  Phosphorus. This mineral is found in meat, poultry, dairy foods, nuts, and legumes. The recommended daily intake for adult men and adult women is 700 mg.  Magnesium. This mineral is found in seeds, nuts, dark green vegetables, and legumes. The recommended daily intake for adult men is 400-420 mg. For adult women, it is 310-320 mg.  Vitamin K. This vitamin is found in green leafy vegetables. The recommended daily intake is 120 mg for adult men and 90 mg for adult women. WHAT TYPE OF PHYSICAL ACTIVITY IS BEST FOR BUILDING AND MAINTAINING HEALTHY BONES? Weight-bearing and strength-building activities are important for building and maintaining peak bone mass. Weight-bearing activities cause muscles and bones to work against gravity. Strength-building activities increases muscle strength that supports bones. Weight-bearing and muscle-building activities include:  Walking and hiking.  Jogging and running.  Dancing.  Gym exercises.  Lifting weights.  Tennis and racquetball.  Climbing stairs.  Aerobics. Adults should get at least 30 minutes of moderate physical activity on most days. Children should get at least 60 minutes of moderate physical activity on most days. Ask your health care provide what type of exercise is best for you. WHERE CAN I FIND MORE INFORMATION? For more information, check out the following websites:  National Osteoporosis Foundation: http://nof.org/learn/basics  National Institutes of Health: http://www.niams.nih.gov/Health_Info/Bone/Bone_Health/bone_health_for_life.asp   This information is not intended to replace advice given to you by your health care provider. Make sure you discuss any questions you have with your health care provider.   Document Released: 05/02/2003 Document Revised: 06/26/2014 Document Reviewed: 02/14/2014 Elsevier Interactive Patient Education 2016 [employer] Inc.  

## 2015-07-12 LAB — CMP14+EGFR
A/G RATIO: 1.8 (ref 1.2–2.2)
ALK PHOS: 59 IU/L (ref 39–117)
ALT: 13 IU/L (ref 0–32)
AST: 24 IU/L (ref 0–40)
Albumin: 4.2 g/dL (ref 3.5–4.7)
BILIRUBIN TOTAL: 0.6 mg/dL (ref 0.0–1.2)
BUN/Creatinine Ratio: 19 (ref 12–28)
BUN: 23 mg/dL (ref 8–27)
CHLORIDE: 103 mmol/L (ref 96–106)
CO2: 23 mmol/L (ref 18–29)
Calcium: 9.1 mg/dL (ref 8.7–10.3)
Creatinine, Ser: 1.21 mg/dL — ABNORMAL HIGH (ref 0.57–1.00)
GFR calc Af Amer: 49 mL/min/{1.73_m2} — ABNORMAL LOW (ref >59)
GFR calc non Af Amer: 42 mL/min/{1.73_m2} — ABNORMAL LOW (ref >59)
GLOBULIN, TOTAL: 2.3 g/dL (ref 1.5–4.5)
Glucose: 92 mg/dL (ref 65–99)
POTASSIUM: 4.7 mmol/L (ref 3.5–5.2)
SODIUM: 139 mmol/L (ref 134–144)
Total Protein: 6.5 g/dL (ref 6.0–8.5)

## 2015-07-12 LAB — LIPID PANEL
CHOL/HDL RATIO: 2.3 ratio (ref 0.0–4.4)
CHOLESTEROL TOTAL: 152 mg/dL (ref 100–199)
HDL: 66 mg/dL (ref >39)
LDL Calculated: 66 mg/dL (ref 0–99)
TRIGLYCERIDES: 98 mg/dL (ref 0–149)
VLDL Cholesterol Cal: 20 mg/dL (ref 5–40)

## 2015-10-08 DIAGNOSIS — H2513 Age-related nuclear cataract, bilateral: Secondary | ICD-10-CM | POA: Diagnosis not present

## 2015-10-08 DIAGNOSIS — H40033 Anatomical narrow angle, bilateral: Secondary | ICD-10-CM | POA: Diagnosis not present

## 2015-10-24 ENCOUNTER — Encounter: Payer: Self-pay | Admitting: Nurse Practitioner

## 2015-10-24 ENCOUNTER — Ambulatory Visit (INDEPENDENT_AMBULATORY_CARE_PROVIDER_SITE_OTHER): Payer: Medicare Other | Admitting: Nurse Practitioner

## 2015-10-24 VITALS — BP 144/70 | HR 54 | Temp 97.0°F | Ht 63.0 in | Wt 169.0 lb

## 2015-10-24 DIAGNOSIS — N183 Chronic kidney disease, stage 3 unspecified: Secondary | ICD-10-CM

## 2015-10-24 DIAGNOSIS — M810 Age-related osteoporosis without current pathological fracture: Secondary | ICD-10-CM | POA: Diagnosis not present

## 2015-10-24 DIAGNOSIS — I10 Essential (primary) hypertension: Secondary | ICD-10-CM | POA: Diagnosis not present

## 2015-10-24 DIAGNOSIS — Z6828 Body mass index (BMI) 28.0-28.9, adult: Secondary | ICD-10-CM

## 2015-10-24 DIAGNOSIS — E785 Hyperlipidemia, unspecified: Secondary | ICD-10-CM

## 2015-10-24 DIAGNOSIS — E038 Other specified hypothyroidism: Secondary | ICD-10-CM | POA: Diagnosis not present

## 2015-10-24 DIAGNOSIS — L989 Disorder of the skin and subcutaneous tissue, unspecified: Secondary | ICD-10-CM

## 2015-10-24 MED ORDER — AMLODIPINE BESYLATE 5 MG PO TABS: ORAL_TABLET | ORAL | 1 refills | Status: DC

## 2015-10-24 MED ORDER — SIMVASTATIN 20 MG PO TABS: 20.0000 mg | ORAL_TABLET | Freq: Every day | ORAL | 1 refills | Status: DC

## 2015-10-24 MED ORDER — LEVOTHYROXINE SODIUM 50 MCG PO TABS: ORAL_TABLET | ORAL | 1 refills | Status: DC

## 2015-10-24 MED ORDER — ALENDRONATE SODIUM 70 MG PO TABS: 70.0000 mg | ORAL_TABLET | ORAL | 1 refills | Status: DC

## 2015-10-24 MED ORDER — FUROSEMIDE 20 MG PO TABS: 20.0000 mg | ORAL_TABLET | Freq: Every day | ORAL | 1 refills | Status: DC

## 2015-10-24 MED ORDER — METOPROLOL TARTRATE 50 MG PO TABS: 50.0000 mg | ORAL_TABLET | Freq: Two times a day (BID) | ORAL | 1 refills | Status: DC

## 2015-10-24 NOTE — Patient Instructions (Signed)
Fall Prevention in the Home  Falls can cause injuries and can affect people from all age groups. There are many simple things that you can do to make your home safe and to help prevent falls. WHAT CAN I DO ON THE OUTSIDE OF MY HOME?  Regularly repair the edges of walkways and driveways and fix any cracks.  Remove high doorway thresholds.  Trim any shrubbery on the main path into your home.  Use bright outdoor lighting.  Clear walkways of debris and clutter, including tools and rocks.  Regularly check that handrails are securely fastened and in good repair. Both sides of any steps should have handrails.  Install guardrails along the edges of any raised decks or porches.  Have leaves, snow, and ice cleared regularly.  Use sand or salt on walkways during winter months.  In the garage, clean up any spills right away, including grease or oil spills. WHAT CAN I DO IN THE BATHROOM?  Use night lights.  Install grab bars by the toilet and in the tub and shower. Do not use towel bars as grab bars.  Use non-skid mats or decals on the floor of the tub or shower.  If you need to sit down while you are in the shower, use a plastic, non-slip stool..  Keep the floor dry. Immediately clean up any water that spills on the floor.  Remove soap buildup in the tub or shower on a regular basis.  Attach bath mats securely with double-sided non-slip rug tape.  Remove throw rugs and other tripping hazards from the floor. WHAT CAN I DO IN THE BEDROOM?  Use night lights.  Make sure that a bedside light is easy to reach.  Do not use oversized bedding that drapes onto the floor.  Have a firm chair that has side arms to use for getting dressed.  Remove throw rugs and other tripping hazards from the floor. WHAT CAN I DO IN THE KITCHEN?   Clean up any spills right away.  Avoid walking on wet floors.  Place frequently used items in easy-to-reach places.  If you need to reach for something  above you, use a sturdy step stool that has a grab bar.  Keep electrical cables out of the way.  Do not use floor polish or wax that makes floors slippery. If you have to use wax, make sure that it is non-skid floor wax.  Remove throw rugs and other tripping hazards from the floor. WHAT CAN I DO IN THE STAIRWAYS?  Do not leave any items on the stairs.  Make sure that there are handrails on both sides of the stairs. Fix handrails that are broken or loose. Make sure that handrails are as long as the stairways.  Check any carpeting to make sure that it is firmly attached to the stairs. Fix any carpet that is loose or worn.  Avoid having throw rugs at the top or bottom of stairways, or secure the rugs with carpet tape to prevent them from moving.  Make sure that you have a light switch at the top of the stairs and the bottom of the stairs. If you do not have them, have them installed. WHAT ARE SOME OTHER FALL PREVENTION TIPS?  Wear closed-toe shoes that fit well and support your feet. Wear shoes that have rubber soles or low heels.  When you use a stepladder, make sure that it is completely opened and that the sides are firmly locked. Have someone hold the ladder while you   are using it. Do not climb a closed stepladder.  Add color or contrast paint or tape to grab bars and handrails in your home. Place contrasting color strips on the first and last steps.  Use mobility aids as needed, such as canes, walkers, scooters, and crutches.  Turn on lights if it is dark. Replace any light bulbs that burn out.  Set up furniture so that there are clear paths. Keep the furniture in the same spot.  Fix any uneven floor surfaces.  Choose a carpet design that does not hide the edge of steps of a stairway.  Be aware of any and all pets.  Review your medicines with your healthcare provider. Some medicines can cause dizziness or changes in blood pressure, which increase your risk of falling. Talk  with your health care provider about other ways that you can decrease your risk of falls. This may include working with a physical therapist or trainer to improve your strength, balance, and endurance.   This information is not intended to replace advice given to you by your health care provider. Make sure you discuss any questions you have with your health care provider.   Document Released: 01/30/2002 Document Revised: 06/26/2014 Document Reviewed: 03/16/2014 Elsevier Interactive Patient Education 2016 Elsevier Inc.  

## 2015-10-24 NOTE — Progress Notes (Signed)
Subjective:    Patient ID: Audrey Manning, female    DOB: [birth year], 82 y.o.   MRN: [id number]  Patient here today for follow up of chronic medical problems. NO changes since last visit.  Outpatient Encounter Prescriptions as of 10/24/2015  Medication Sig  . alendronate (FOSAMAX) 70 MG tablet Take 1 tablet (70 mg total) by mouth every 7 (seven) days. Take with a full glass of water on an empty stomach.  Marland Kitchen amLODipine (NORVASC) 5 MG tablet TAKE ONE (1) TABLET EACH DAY  . Calcium Carbonate-Vitamin D (CALCIUM 500 + D) 500-125 MG-UNIT TABS Take 500 mg by mouth daily.  . furosemide (LASIX) 20 MG tablet Take 1 tablet (20 mg total) by mouth daily.  Marland Kitchen levothyroxine (SYNTHROID, LEVOTHROID) 50 MCG tablet TAKE 1 TABLET BY MOUTH ONCE DAILY BEFORE  BREAKFAST  . meclizine (ANTIVERT) 25 MG tablet Take 1 tablet (25 mg total) by mouth 3 (three) times daily as needed for dizziness.  . metoprolol (LOPRESSOR) 50 MG tablet Take 1 tablet (50 mg total) by mouth 2 (two) times daily.  . simvastatin (ZOCOR) 20 MG tablet Take 1 tablet (20 mg total) by mouth at bedtime.  . triamcinolone cream (KENALOG) 0.1 % APPLY TWICE DAILY   No facility-administered encounter medications on file as of 10/24/2015.    * Has sore place on nose that stays scabbed over- would like to go to dernatology  Hypertension  This is a chronic problem. The current episode started more than 1 year ago. The problem is unchanged. The problem is controlled. Pertinent negatives include no headaches, neck pain, palpitations or shortness of breath. Risk factors for coronary artery disease include dyslipidemia, obesity and post-menopausal state. The current treatment provides moderate improvement. Compliance problems include diet and exercise.  Hypertensive end-organ damage includes a thyroid problem.  Hyperlipidemia  This is a chronic problem. The current episode started more than 1 year ago. The problem is controlled. Recent lipid tests were reviewed and  are normal. Exacerbating diseases include hypothyroidism and obesity. She has no history of diabetes. Pertinent negatives include no myalgias or shortness of breath. Current antihyperlipidemic treatment includes statins. The current treatment provides no improvement of lipids. Compliance problems include adherence to diet and adherence to exercise.  Risk factors for coronary artery disease include dyslipidemia, hypertension, obesity and post-menopausal.  Thyroid Problem  Patient reports no cold intolerance, diaphoresis, diarrhea, menstrual problem or palpitations. Her past medical history is significant for hyperlipidemia. There is no history of diabetes.  osteoporosis Fosamax weekly- No C/o side effects.  CKD Currently just watching labs- was told to avoid NSAIDS  Review of Systems  Constitutional: Negative for diaphoresis.  Respiratory: Negative for shortness of breath.   Cardiovascular: Negative for palpitations.  Gastrointestinal: Negative for diarrhea.  Endocrine: Negative for cold intolerance.  Genitourinary: Negative for menstrual problem.  Musculoskeletal: Negative for myalgias and neck pain.  Neurological: Negative.  Negative for headaches.  Psychiatric/Behavioral: Negative.   All other systems reviewed and are negative.      Objective:   Physical Exam  Constitutional: She is oriented to person, place, and time. She appears well-developed and well-nourished. No distress.  HENT:  Right Ear: External ear normal.  Left Ear: External ear normal.  Nose: Nose normal.  Mouth/Throat: Oropharynx is clear and moist. No oropharyngeal exudate.  Eyes: Conjunctivae and EOM are normal. Pupils are equal, round, and reactive to light.  Neck: Trachea normal, normal range of motion and full passive range of motion without pain. Neck supple. No JVD  present. Carotid bruit is not present. No thyromegaly present.  Cardiovascular: Normal rate, regular rhythm, normal heart sounds and intact distal  pulses.  Exam reveals no gallop and no friction rub.   No murmur heard. Pulmonary/Chest: Effort normal and breath sounds normal. No respiratory distress.  Abdominal: Soft. Bowel sounds are normal. She exhibits no distension and no mass. There is no tenderness.  Musculoskeletal: Normal range of motion. She exhibits edema (2+ pitting edema right lower leg).  Lymphadenopathy:    She has no cervical adenopathy.  Neurological: She is alert and oriented to person, place, and time. She has normal reflexes.  Skin: Skin is warm and dry.  Psychiatric: She has a normal mood and affect. Her behavior is normal. Judgment and thought content normal.  Vitals reviewed.  BP (!) 144/70 (BP Location: Left Arm, Cuff Size: Large)   Pulse (!) 54   Temp 97 F (36.1 C) (Oral)   Ht 5' 3" (1.6 m)   Wt 169 lb (76.7 kg)   BMI 29.94 kg/m          Assessment & Plan:  1. Essential hypertension Do not add salt to diet - metoprolol (LOPRESSOR) 50 MG tablet; Take 1 tablet (50 mg total) by mouth 2 (two) times daily.  Dispense: 180 tablet; Refill: 1 - furosemide (LASIX) 20 MG tablet; Take 1 tablet (20 mg total) by mouth daily.  Dispense: 90 tablet; Refill: 1 - amLODipine (NORVASC) 5 MG tablet; TAKE ONE (1) TABLET EACH DAY  Dispense: 90 tablet; Refill: 1 - CMP14+EGFR  2. Hyperlipidemia Low fat diet - simvastatin (ZOCOR) 20 MG tablet; Take 1 tablet (20 mg total) by mouth at bedtime.  Dispense: 90 tablet; Refill: 1 - Lipid panel  3. Osteoporosis Weight bearing exercises - alendronate (FOSAMAX) 70 MG tablet; Take 1 tablet (70 mg total) by mouth every 7 (seven) days. Take with a full glass of water on an empty stomach.  Dispense: 12 tablet; Refill: 1  4. BMI 28.0-28.9,adult Discussed diet and exercise for person with BMI >25 Will recheck weight in 3-6 months  5. Other specified hypothyroidism - levothyroxine (SYNTHROID, LEVOTHROID) 50 MCG tablet; TAKE 1 TABLET BY MOUTH ONCE DAILY BEFORE  BREAKFAST  Dispense:  90 tablet; Refill: 1 - Thyroid Panel With TSH  6. CKD (chronic kidney disease) stage 3, GFR 30-59 ml/min  7. External nasal lesion Do not pick or scratch - Ambulatory referral to Dermatology    Labs pending Health maintenance reviewed Diet and exercise encouraged Continue all meds Follow up  In 3 months   [place], FNP

## 2015-10-25 LAB — CMP14+EGFR
ALK PHOS: 63 IU/L (ref 39–117)
ALT: 14 IU/L (ref 0–32)
AST: 30 IU/L (ref 0–40)
Albumin/Globulin Ratio: 1.7 (ref 1.2–2.2)
Albumin: 4.7 g/dL (ref 3.5–4.7)
BUN/Creatinine Ratio: 23 (ref 12–28)
BUN: 26 mg/dL (ref 8–27)
Bilirubin Total: 0.7 mg/dL (ref 0.0–1.2)
CALCIUM: 9.8 mg/dL (ref 8.7–10.3)
CO2: 25 mmol/L (ref 18–29)
CREATININE: 1.14 mg/dL — AB (ref 0.57–1.00)
Chloride: 100 mmol/L (ref 96–106)
GFR calc Af Amer: 52 mL/min/{1.73_m2} — ABNORMAL LOW (ref >59)
GFR, EST NON AFRICAN AMERICAN: 46 mL/min/{1.73_m2} — AB (ref >59)
GLUCOSE: 101 mg/dL — AB (ref 65–99)
Globulin, Total: 2.8 g/dL (ref 1.5–4.5)
Potassium: 4.4 mmol/L (ref 3.5–5.2)
Sodium: 141 mmol/L (ref 134–144)
Total Protein: 7.5 g/dL (ref 6.0–8.5)

## 2015-10-25 LAB — THYROID PANEL WITH TSH
Free Thyroxine Index: 2.3 (ref 1.2–4.9)
T3 UPTAKE RATIO: 26 % (ref 24–39)
T4, Total: 8.8 ug/dL (ref 4.5–12.0)
TSH: 2.27 u[IU]/mL (ref 0.450–4.500)

## 2015-10-25 LAB — LIPID PANEL
CHOLESTEROL TOTAL: 148 mg/dL (ref 100–199)
Chol/HDL Ratio: 2.3 ratio units (ref 0.0–4.4)
HDL: 65 mg/dL (ref >39)
LDL CALC: 55 mg/dL (ref 0–99)
TRIGLYCERIDES: 141 mg/dL (ref 0–149)
VLDL CHOLESTEROL CAL: 28 mg/dL (ref 5–40)

## 2015-11-14 DIAGNOSIS — C44311 Basal cell carcinoma of skin of nose: Secondary | ICD-10-CM | POA: Diagnosis not present

## 2015-11-14 DIAGNOSIS — Z85828 Personal history of other malignant neoplasm of skin: Secondary | ICD-10-CM | POA: Diagnosis not present

## 2015-11-14 DIAGNOSIS — D485 Neoplasm of uncertain behavior of skin: Secondary | ICD-10-CM | POA: Diagnosis not present

## 2015-11-25 ENCOUNTER — Ambulatory Visit (INDEPENDENT_AMBULATORY_CARE_PROVIDER_SITE_OTHER): Payer: Medicare Other

## 2015-11-25 DIAGNOSIS — Z23 Encounter for immunization: Secondary | ICD-10-CM

## 2015-12-04 ENCOUNTER — Other Ambulatory Visit: Payer: Self-pay | Admitting: Nurse Practitioner

## 2015-12-04 DIAGNOSIS — M810 Age-related osteoporosis without current pathological fracture: Secondary | ICD-10-CM

## 2015-12-05 DIAGNOSIS — C44311 Basal cell carcinoma of skin of nose: Secondary | ICD-10-CM | POA: Diagnosis not present

## 2016-01-29 ENCOUNTER — Encounter (INDEPENDENT_AMBULATORY_CARE_PROVIDER_SITE_OTHER): Payer: Self-pay

## 2016-01-29 ENCOUNTER — Ambulatory Visit (INDEPENDENT_AMBULATORY_CARE_PROVIDER_SITE_OTHER): Payer: Medicare Other | Admitting: Nurse Practitioner

## 2016-01-29 ENCOUNTER — Encounter: Payer: Self-pay | Admitting: Nurse Practitioner

## 2016-01-29 VITALS — BP 124/82 | HR 54 | Temp 97.2°F | Ht 63.0 in | Wt 166.2 lb

## 2016-01-29 DIAGNOSIS — M79644 Pain in right finger(s): Secondary | ICD-10-CM

## 2016-01-29 DIAGNOSIS — Z6828 Body mass index (BMI) 28.0-28.9, adult: Secondary | ICD-10-CM

## 2016-01-29 DIAGNOSIS — M810 Age-related osteoporosis without current pathological fracture: Secondary | ICD-10-CM

## 2016-01-29 DIAGNOSIS — N183 Chronic kidney disease, stage 3 unspecified: Secondary | ICD-10-CM

## 2016-01-29 DIAGNOSIS — E038 Other specified hypothyroidism: Secondary | ICD-10-CM | POA: Diagnosis not present

## 2016-01-29 DIAGNOSIS — E782 Mixed hyperlipidemia: Secondary | ICD-10-CM

## 2016-01-29 DIAGNOSIS — I10 Essential (primary) hypertension: Secondary | ICD-10-CM

## 2016-01-29 NOTE — Patient Instructions (Signed)
Low-Sodium Eating Plan Sodium raises blood pressure and causes water to be held in the body. Getting less sodium from food will help lower your blood pressure, reduce any swelling, and protect your heart, liver, and kidneys. We get sodium by adding salt (sodium chloride) to food. Most of our sodium comes from canned, boxed, and frozen foods. Restaurant foods, fast foods, and pizza are also very high in sodium. Even if you take medicine to lower your blood pressure or to reduce fluid in your body, getting less sodium from your food is important. What is my plan? Most people should limit their sodium intake to 2,300 mg a day. Your health care provider recommends that you limit your sodium intake to __________ a day. What do I need to know about this eating plan? For the low-sodium eating plan, you will follow these general guidelines:  Choose foods with a % Daily Value for sodium of less than 5% (as listed on the food label).  Use salt-free seasonings or herbs instead of table salt or sea salt.  Check with your health care provider or pharmacist before using salt substitutes.  Eat fresh foods.  Eat more vegetables and fruits.  Limit canned vegetables. If you do use them, rinse them well to decrease the sodium.  Limit cheese to 1 oz (28 g) per day.  Eat lower-sodium products, often labeled as "lower sodium" or "no salt added."  Avoid foods that contain monosodium glutamate (MSG). MSG is sometimes added to Chinese food and some canned foods.  Check food labels (Nutrition Facts labels) on foods to learn how much sodium is in one serving.  Eat more home-cooked food and less restaurant, buffet, and fast food.  When eating at a restaurant, ask that your food be prepared with less salt, or no salt if possible. How do I read food labels for sodium information? The Nutrition Facts label lists the amount of sodium in one serving of the food. If you eat more than one serving, you must multiply the  listed amount of sodium by the number of servings. Food labels may also identify foods as:  Sodium free-Less than 5 mg in a serving.  Very low sodium-35 mg or less in a serving.  Low sodium-140 mg or less in a serving.  Light in sodium-50% less sodium in a serving. For example, if a food that usually has 300 mg of sodium is changed to become light in sodium, it will have 150 mg of sodium.  Reduced sodium-25% less sodium in a serving. For example, if a food that usually has 400 mg of sodium is changed to reduced sodium, it will have 300 mg of sodium. What foods can I eat? Grains  Low-sodium cereals, including oats, puffed wheat and rice, and shredded wheat cereals. Low-sodium crackers. Unsalted rice and pasta. Lower-sodium bread. Vegetables  Frozen or fresh vegetables. Low-sodium or reduced-sodium canned vegetables. Low-sodium or reduced-sodium tomato sauce and paste. Low-sodium or reduced-sodium tomato and vegetable juices. Fruits  Fresh, frozen, and canned fruit. Fruit juice. Meat and Other Protein Products  Low-sodium canned tuna and salmon. Fresh or frozen meat, poultry, seafood, and fish. Lamb. Unsalted nuts. Dried beans, peas, and lentils without added salt. Unsalted canned beans. Homemade soups without salt. Eggs. Dairy  Milk. Soy milk. Ricotta cheese. Low-sodium or reduced-sodium cheeses. Yogurt. Condiments  Fresh and dried herbs and spices. Salt-free seasonings. Onion and garlic powders. Low-sodium varieties of mustard and ketchup. Fresh or refrigerated horseradish. Lemon juice. Fats and Oils  Reduced-sodium   salad dressings. Unsalted butter. Other  Unsalted popcorn and pretzels. The items listed above may not be a complete list of recommended foods or beverages. Contact your dietitian for more options.  What foods are not recommended? Grains  Instant hot cereals. Bread stuffing, pancake, and biscuit mixes. Croutons. Seasoned rice or pasta mixes. Noodle soup cups. Boxed or  frozen macaroni and cheese. Self-rising flour. Regular salted crackers. Vegetables  Regular canned vegetables. Regular canned tomato sauce and paste. Regular tomato and vegetable juices. Frozen vegetables in sauces. Salted French fries. Olives. Pickles. Relishes. Sauerkraut. Salsa. Meat and Other Protein Products  Salted, canned, smoked, spiced, or pickled meats, seafood, or fish. Bacon, ham, sausage, hot dogs, corned beef, chipped beef, and packaged luncheon meats. Salt pork. Jerky. Pickled herring. Anchovies, regular canned tuna, and sardines. Salted nuts. Dairy  Processed cheese and cheese spreads. Cheese curds. Blue cheese and cottage cheese. Buttermilk. Condiments  Onion and garlic salt, seasoned salt, table salt, and sea salt. Canned and packaged gravies. Worcestershire sauce. Tartar sauce. Barbecue sauce. Teriyaki sauce. Soy sauce, including reduced sodium. Steak sauce. Fish sauce. Oyster sauce. Cocktail sauce. Horseradish that you find on the shelf. Regular ketchup and mustard. Meat flavorings and tenderizers. Bouillon cubes. Hot sauce. Tabasco sauce. Marinades. Taco seasonings. Relishes. Fats and Oils  Regular salad dressings. Salted butter. Margarine. Ghee. Bacon fat. Other  Potato and tortilla chips. Corn chips and puffs. Salted popcorn and pretzels. Canned or dried soups. Pizza. Frozen entrees and pot pies. The items listed above may not be a complete list of foods and beverages to avoid. Contact your dietitian for more information.  This information is not intended to replace advice given to you by your health care provider. Make sure you discuss any questions you have with your health care provider. Document Released: 08/01/2001 Document Revised: 07/18/2015 Document Reviewed: 12/14/2012 Elsevier Interactive Patient Education  2017 [employer] Inc.  

## 2016-01-29 NOTE — Progress Notes (Signed)
Subjective:    Patient ID: Audrey Manning, female    DOB: 1932/11/10, 82 y.o.   MRN: [id number]  Patient here today for follow up of chronic medical problems. NO changes since last visit.   Outpatient Encounter Prescriptions as of 01/29/2016  Medication Sig  . alendronate (FOSAMAX) 70 MG tablet Take 1 tablet (70 mg total) by mouth every 7 (seven) days. Take with a full glass of water on an empty stomach.  Marland Kitchen amLODipine (NORVASC) 5 MG tablet TAKE ONE (1) TABLET EACH DAY  . Cholecalciferol (VITAMIN D3) 1000 units CAPS Take 1 capsule by mouth daily.  . furosemide (LASIX) 20 MG tablet Take 1 tablet (20 mg total) by mouth daily.  Marland Kitchen levothyroxine (SYNTHROID, LEVOTHROID) 50 MCG tablet TAKE 1 TABLET BY MOUTH ONCE DAILY BEFORE  BREAKFAST  . metoprolol (LOPRESSOR) 50 MG tablet Take 1 tablet (50 mg total) by mouth 2 (two) times daily.  . simvastatin (ZOCOR) 20 MG tablet Take 1 tablet (20 mg total) by mouth at bedtime.  . triamcinolone cream (KENALOG) 0.1 % APPLY TWICE DAILY  . Calcium Carbonate-Vitamin D (CALCIUM 500 + D) 500-125 MG-UNIT TABS Take 500 mg by mouth daily. (Patient not taking: Reported on 01/29/2016)  . meclizine (ANTIVERT) 25 MG tablet Take 1 tablet (25 mg total) by mouth 3 (three) times daily as needed for dizziness. (Patient not taking: Reported on 01/29/2016)   * had nasal lesion at last visit and went to see dermatologist- had "skin cancer" - was treated with laser- doing well. * right thumb pain- has been wearing a brace which helps a little.  Hypertension  This is a chronic problem. The current episode started more than 1 year ago. The problem is unchanged. The problem is controlled. Pertinent negatives include no headaches, neck pain, palpitations or shortness of breath. Risk factors for coronary artery disease include dyslipidemia, obesity and post-menopausal state. The current treatment provides moderate improvement. Compliance problems include diet and exercise.  Hypertensive  end-organ damage includes a thyroid problem.  Hyperlipidemia  This is a chronic problem. The current episode started more than 1 year ago. The problem is controlled. Recent lipid tests were reviewed and are normal. Exacerbating diseases include hypothyroidism and obesity. She has no history of diabetes. Pertinent negatives include no myalgias or shortness of breath. Current antihyperlipidemic treatment includes statins. The current treatment provides no improvement of lipids. Compliance problems include adherence to diet and adherence to exercise.  Risk factors for coronary artery disease include dyslipidemia, hypertension, obesity and post-menopausal.  Thyroid Problem  Patient reports no cold intolerance, diaphoresis, diarrhea, menstrual problem or palpitations. Her past medical history is significant for hyperlipidemia. There is no history of diabetes.  osteoporosis Fosamax weekly- No C/o side effects.  CKD Currently just watching labs- was told to avoid NSAIDS  Review of Systems  Constitutional: Negative for diaphoresis.  Respiratory: Negative for shortness of breath.   Cardiovascular: Negative for palpitations.  Gastrointestinal: Negative for diarrhea.  Endocrine: Negative for cold intolerance.  Genitourinary: Negative for menstrual problem.  Musculoskeletal: Negative for myalgias and neck pain.  Neurological: Negative.  Negative for headaches.  Psychiatric/Behavioral: Negative.   All other systems reviewed and are negative.      Objective:   Physical Exam  Constitutional: She is oriented to person, place, and time. She appears well-developed and well-nourished. No distress.  HENT:  Right Ear: External ear normal.  Left Ear: External ear normal.  Nose: Nose normal.  Mouth/Throat: Oropharynx is clear and moist. No oropharyngeal exudate.  Eyes: Conjunctivae and EOM are normal. Pupils are equal, round, and reactive to light.  Neck: Trachea normal, normal range of motion and full  passive range of motion without pain. Neck supple. No JVD present. Carotid bruit is not present. No thyromegaly present.  Cardiovascular: Normal rate, regular rhythm, normal heart sounds and intact distal pulses.  Exam reveals no gallop and no friction rub.   No murmur heard. Pulmonary/Chest: Effort normal and breath sounds normal. No respiratory distress.  Abdominal: Soft. Bowel sounds are normal. She exhibits no distension and no mass. There is no tenderness.  Musculoskeletal: Normal range of motion. She exhibits edema (2+ pitting edema right lower leg).  Lymphadenopathy:    She has no cervical adenopathy.  Neurological: She is alert and oriented to person, place, and time. She has normal reflexes.  Skin: Skin is warm and dry.  Psychiatric: She has a normal mood and affect. Her behavior is normal. Judgment and thought content normal.  Vitals reviewed.  BP 124/82 (BP Location: Left Arm, Cuff Size: Large)   Pulse (!) 54   Temp 97.2 F (36.2 C) (Oral)   Ht _0  (1.6 m)   Wt 166 lb 3.2 oz (75.4 kg)   BMI 29.44 kg/m       Assessment & Plan:  1. Essential hypertension Low sodium diet - CMP14+EGFR  2. Other specified hypothyroidism - Thyroid Panel With TSH  3. Age-related osteoporosis without current pathological fracture Weight bearing exercises  4. CKD (chronic kidney disease) stage 3, GFR 30-59 ml/min Avoid NSAIDS  5. BMI 28.0-28.9,adult .mmmb  6. Mixed hyperlipidemia Low fat diet - Lipid panel  7. Pain of right thumb Patient does not want referral at this time- she will let me know if not improving    Labs pending Health maintenance reviewed Diet and exercise encouraged Continue all meds Follow up  In 3 month   [place], FNP

## 2016-01-30 LAB — SPECIMEN STATUS

## 2016-01-31 LAB — CMP14+EGFR
A/G RATIO: 1.7 (ref 1.2–2.2)
ALBUMIN: 4.7 g/dL (ref 3.5–4.7)
ALK PHOS: 61 IU/L (ref 39–117)
ALT: 10 IU/L (ref 0–32)
AST: 25 IU/L (ref 0–40)
BILIRUBIN TOTAL: 0.7 mg/dL (ref 0.0–1.2)
BUN / CREAT RATIO: 18 (ref 12–28)
BUN: 26 mg/dL (ref 8–27)
CHLORIDE: 100 mmol/L (ref 96–106)
CO2: 26 mmol/L (ref 18–29)
Calcium: 9.8 mg/dL (ref 8.7–10.3)
Creatinine, Ser: 1.46 mg/dL — ABNORMAL HIGH (ref 0.57–1.00)
GFR calc non Af Amer: 34 mL/min/{1.73_m2} — ABNORMAL LOW (ref >59)
GFR, EST AFRICAN AMERICAN: 39 mL/min/{1.73_m2} — AB (ref >59)
GLUCOSE: 97 mg/dL (ref 65–99)
Globulin, Total: 2.7 g/dL (ref 1.5–4.5)
POTASSIUM: 4.2 mmol/L (ref 3.5–5.2)
SODIUM: 142 mmol/L (ref 134–144)
Total Protein: 7.4 g/dL (ref 6.0–8.5)

## 2016-01-31 LAB — LIPID PANEL
CHOLESTEROL TOTAL: 148 mg/dL (ref 100–199)
Chol/HDL Ratio: 2.3 ratio units (ref 0.0–4.4)
HDL: 64 mg/dL (ref >39)
LDL Calculated: 58 mg/dL (ref 0–99)
Triglycerides: 131 mg/dL (ref 0–149)
VLDL CHOLESTEROL CAL: 26 mg/dL (ref 5–40)

## 2016-01-31 LAB — THYROID PANEL WITH TSH
FREE THYROXINE INDEX: 2.7 (ref 1.2–4.9)
T3 UPTAKE RATIO: 31 % (ref 24–39)
T4 TOTAL: 8.8 ug/dL (ref 4.5–12.0)
TSH: 1.58 u[IU]/mL (ref 0.450–4.500)

## 2016-02-01 LAB — SPECIMEN STATUS REPORT

## 2016-02-01 LAB — THYROID PANEL WITH TSH
Free Thyroxine Index: 2.6 (ref 1.2–4.9)
T3 Uptake Ratio: 30 % (ref 24–39)
T4 TOTAL: 8.8 ug/dL (ref 4.5–12.0)
TSH: 1.66 u[IU]/mL (ref 0.450–4.500)

## 2016-03-26 ENCOUNTER — Other Ambulatory Visit: Payer: Self-pay | Admitting: Nurse Practitioner

## 2016-03-26 DIAGNOSIS — E038 Other specified hypothyroidism: Secondary | ICD-10-CM

## 2016-04-28 ENCOUNTER — Ambulatory Visit (INDEPENDENT_AMBULATORY_CARE_PROVIDER_SITE_OTHER): Payer: Medicare Other | Admitting: Nurse Practitioner

## 2016-04-28 ENCOUNTER — Encounter: Payer: Self-pay | Admitting: Nurse Practitioner

## 2016-04-28 VITALS — BP 144/55 | HR 58 | Temp 96.7°F | Ht 63.0 in | Wt 160.0 lb

## 2016-04-28 DIAGNOSIS — E038 Other specified hypothyroidism: Secondary | ICD-10-CM

## 2016-04-28 DIAGNOSIS — I10 Essential (primary) hypertension: Secondary | ICD-10-CM | POA: Diagnosis not present

## 2016-04-28 DIAGNOSIS — M810 Age-related osteoporosis without current pathological fracture: Secondary | ICD-10-CM

## 2016-04-28 DIAGNOSIS — Z6828 Body mass index (BMI) 28.0-28.9, adult: Secondary | ICD-10-CM | POA: Diagnosis not present

## 2016-04-28 DIAGNOSIS — N183 Chronic kidney disease, stage 3 unspecified: Secondary | ICD-10-CM

## 2016-04-28 DIAGNOSIS — E782 Mixed hyperlipidemia: Secondary | ICD-10-CM

## 2016-04-28 MED ORDER — METOPROLOL TARTRATE 50 MG PO TABS: 50.0000 mg | ORAL_TABLET | Freq: Two times a day (BID) | ORAL | 1 refills | Status: DC

## 2016-04-28 MED ORDER — AMLODIPINE BESYLATE 5 MG PO TABS: ORAL_TABLET | ORAL | 1 refills | Status: DC

## 2016-04-28 MED ORDER — SIMVASTATIN 20 MG PO TABS: 20.0000 mg | ORAL_TABLET | Freq: Every day | ORAL | 1 refills | Status: DC

## 2016-04-28 MED ORDER — ALENDRONATE SODIUM 70 MG PO TABS: 70.0000 mg | ORAL_TABLET | ORAL | 1 refills | Status: DC

## 2016-04-28 MED ORDER — FUROSEMIDE 20 MG PO TABS: 20.0000 mg | ORAL_TABLET | Freq: Every day | ORAL | 1 refills | Status: DC

## 2016-04-28 NOTE — Progress Notes (Signed)
Subjective:    Patient ID: Audrey Manning, female    DOB: 09-12-1932, 83 y.o.   MRN: [id number]  Patient here today for follow up of chronic medical problems. NO changes since last visit.  No complaints today.  Outpatient Encounter Prescriptions as of 01/29/2016  Medication Sig  . alendronate (FOSAMAX) 70 MG tablet Take 1 tablet (70 mg total) by mouth every 7 (seven) days. Take with a full glass of water on an empty stomach.  Marland Kitchen amLODipine (NORVASC) 5 MG tablet TAKE ONE (1) TABLET EACH DAY  . Cholecalciferol (VITAMIN D3) 1000 units CAPS Take 1 capsule by mouth daily.  . furosemide (LASIX) 20 MG tablet Take 1 tablet (20 mg total) by mouth daily.  Marland Kitchen levothyroxine (SYNTHROID, LEVOTHROID) 50 MCG tablet TAKE 1 TABLET BY MOUTH ONCE DAILY BEFORE  BREAKFAST  . metoprolol (LOPRESSOR) 50 MG tablet Take 1 tablet (50 mg total) by mouth 2 (two) times daily.  . simvastatin (ZOCOR) 20 MG tablet Take 1 tablet (20 mg total) by mouth at bedtime.  . triamcinolone cream (KENALOG) 0.1 % APPLY TWICE DAILY  . Calcium Carbonate-Vitamin D (CALCIUM 500 + D) 500-125 MG-UNIT TABS Take 500 mg by mouth daily. (Patient not taking: Reported on 01/29/2016)  . meclizine (ANTIVERT) 25 MG tablet Take 1 tablet (25 mg total) by mouth 3 (three) times daily as needed for dizziness. (Patient not taking: Reported on 01/29/2016)     Hypertension  This is a chronic problem. The current episode started more than 1 year ago. The problem is unchanged. The problem is controlled. Pertinent negatives include no headaches, neck pain, palpitations or shortness of breath. Risk factors for coronary artery disease include dyslipidemia, obesity and post-menopausal state. The current treatment provides moderate improvement. Compliance problems include diet and exercise.  Identifiable causes of hypertension include a thyroid problem.  Hyperlipidemia  This is a chronic problem. The current episode started more than 1 year ago. The problem is controlled.  Recent lipid tests were reviewed and are normal. Exacerbating diseases include hypothyroidism and obesity. She has no history of diabetes. Pertinent negatives include no myalgias or shortness of breath. Current antihyperlipidemic treatment includes statins. The current treatment provides no improvement of lipids. Compliance problems include adherence to diet and adherence to exercise.  Risk factors for coronary artery disease include dyslipidemia, hypertension, obesity and post-menopausal.  Thyroid Problem  Patient reports no cold intolerance, diaphoresis, diarrhea, menstrual problem or palpitations. Her past medical history is significant for hyperlipidemia. There is no history of diabetes.  osteoporosis Fosamax weekly- No C/o side effects.  CKD Currently just watching labs- was told to avoid NSAIDS  Review of Systems  Constitutional: Negative for diaphoresis.  Respiratory: Negative for shortness of breath.   Cardiovascular: Negative for palpitations.  Gastrointestinal: Negative for diarrhea.  Endocrine: Negative for cold intolerance.  Genitourinary: Negative for menstrual problem.  Musculoskeletal: Negative for myalgias and neck pain.  Neurological: Negative.  Negative for headaches.  Psychiatric/Behavioral: Negative.   All other systems reviewed and are negative.      Objective:   Physical Exam  Constitutional: She is oriented to person, place, and time. She appears well-developed and well-nourished. No distress.  HENT:  Right Ear: External ear normal.  Left Ear: External ear normal.  Nose: Nose normal.  Mouth/Throat: Oropharynx is clear and moist. No oropharyngeal exudate.  Eyes: Conjunctivae and EOM are normal. Pupils are equal, round, and reactive to light.  Neck: Trachea normal, normal range of motion and full passive range of motion without pain.  Neck supple. No JVD present. Carotid bruit is not present. No thyromegaly present.  Cardiovascular: Normal rate, regular rhythm,  normal heart sounds and intact distal pulses.  Exam reveals no gallop and no friction rub.   No murmur heard. Pulmonary/Chest: Effort normal and breath sounds normal. No respiratory distress.  Abdominal: Soft. Bowel sounds are normal. She exhibits no distension and no mass. There is no tenderness.  Musculoskeletal: Normal range of motion. She exhibits edema (2+ pitting edema right lower leg).  Lymphadenopathy:    She has no cervical adenopathy.  Neurological: She is alert and oriented to person, place, and time. She has normal reflexes.  Skin: Skin is warm and dry.  Psychiatric: She has a normal mood and affect. Her behavior is normal. Judgment and thought content normal.  Vitals reviewed.  BP (!) 144/55   Pulse (!) 58   Temp (!) 96.7 F (35.9 C) (Oral)   Ht 5' 3"  (1.6 m)   Wt 160 lb (72.6 kg)   BMI 28.34 kg/m       Assessment & Plan:  1. Essential hypertension Dash diet - metoprolol (LOPRESSOR) 50 MG tablet; Take 1 tablet (50 mg total) by mouth 2 (two) times daily.  Dispense: 180 tablet; Refill: 1 - furosemide (LASIX) 20 MG tablet; Take 1 tablet (20 mg total) by mouth daily.  Dispense: 90 tablet; Refill: 1 - amLODipine (NORVASC) 5 MG tablet; TAKE ONE (1) TABLET EACH DAY  Dispense: 90 tablet; Refill: 1 - CMP14+EGFR  2. Other specified hypothyroidism - Thyroid Panel With TSH  3. CKD (chronic kidney disease) stage 3, GFR 30-59 ml/min Will continue to monitor labs  4. BMI 28.0-28.9,adult Discussed diet and exercise for person with BMI >25 Will recheck weight in 3-6 months  5. Mixed hyperlipidemia Low fat diet - simvastatin (ZOCOR) 20 MG tablet; Take 1 tablet (20 mg total) by mouth at bedtime.  Dispense: 90 tablet; Refill: 1 - Lipid panel  6. Age-related osteoporosis without current pathological fracture Weight bearing exercises - alendronate (FOSAMAX) 70 MG tablet; Take 1 tablet (70 mg total) by mouth every 7 (seven) days. Take with a full glass of water on an empty  stomach.  Dispense: 12 tablet; Refill: 1    Labs pending Health maintenance reviewed Diet and exercise encouraged Continue all meds Follow up  In 3 months   [place], FNP

## 2016-04-28 NOTE — Patient Instructions (Signed)
Bone Health Bones protect organs, store calcium, and anchor muscles. Good health habits, such as eating nutritious foods and exercising regularly, are important for maintaining healthy bones. They can also help to prevent a condition that causes bones to lose density and become weak and brittle (osteoporosis). Why is bone mass important? Bone mass refers to the amount of bone tissue that you have. The higher your bone mass, the stronger your bones. An important step toward having healthy bones throughout life is to have strong and dense bones during childhood. A young adult who has a high bone mass is more likely to have a high bone mass later in life. Bone mass at its greatest it is called peak bone mass. A large decline in bone mass occurs in older adults. In women, it occurs about the time of menopause. During this time, it is important to practice good health habits, because if more bone is lost than what is replaced, the bones will become less healthy and more likely to break (fracture). If you find that you have a low bone mass, you may be able to prevent osteoporosis or further bone loss by changing your diet and lifestyle. How can I find out if my bone mass is low? Bone mass can be measured with an X-ray test that is called a bone mineral density (BMD) test. This test is recommended for all women who are age 65 or older. It may also be recommended for men who are age 70 or older, or for people who are more likely to develop osteoporosis due to:  Having bones that break easily.  Having a long-term disease that weakens bones, such as kidney disease or rheumatoid arthritis.  Having menopause earlier than normal.  Taking medicine that weakens bones, such as steroids, thyroid hormones, or hormone treatment for breast cancer or prostate cancer.  Smoking.  Drinking three or more alcoholic drinks each day. What are the nutritional recommendations for healthy bones? To have healthy bones, you need  to get enough of the right minerals and vitamins. Most nutrition experts recommend getting these nutrients from the foods that you eat. Nutritional recommendations vary from person to person. Ask your health care provider what is healthy for you. Here are some general guidelines. Calcium Recommendations  Calcium is the most important (essential) mineral for bone health. Most people can get enough calcium from their diet, but supplements may be recommended for people who are at risk for osteoporosis. Good sources of calcium include:  Dairy products, such as low-fat or nonfat milk, cheese, and yogurt.  Dark green leafy vegetables, such as bok choy and broccoli.  Calcium-fortified foods, such as orange juice, cereal, bread, soy beverages, and tofu products.  Nuts, such as almonds. Follow these recommended amounts for daily calcium intake:  Children, age 1?3: 700 mg.  Children, age 4?8: 1,000 mg.  Children, age 9?13: 1,300 mg.  Teens, age 14?18: 1,300 mg.  Adults, age 19?50: 1,000 mg.  Adults, age 51?70:  Men: 1,000 mg.  Women: 1,200 mg.  Adults, age 71 or older: 1,200 mg.  Pregnant and breastfeeding females:  Teens: 1,300 mg.  Adults: 1,000 mg. Vitamin D Recommendations  Vitamin D is the most essential vitamin for bone health. It helps the body to absorb calcium. Sunlight stimulates the skin to make vitamin D, so be sure to get enough sunlight. If you live in a cold climate or you do not get outside often, your health care provider may recommend that you take vitamin D   supplements. Good sources of vitamin D in your diet include:  Egg yolks.  Saltwater fish.  Milk and cereal fortified with vitamin D. Follow these recommended amounts for daily vitamin D intake:  Children and teens, age 1?18: 600 international units.  Adults, age 50 or younger: 400-800 international units.  Adults, age 51 or older: 800-1,000 international units. Other Nutrients  Other nutrients for bone  health include:  Phosphorus. This mineral is found in meat, poultry, dairy foods, nuts, and legumes. The recommended daily intake for adult men and adult women is 700 mg.  Magnesium. This mineral is found in seeds, nuts, dark green vegetables, and legumes. The recommended daily intake for adult men is 400?420 mg. For adult women, it is 310?320 mg.  Vitamin K. This vitamin is found in green leafy vegetables. The recommended daily intake is 120 mg for adult men and 90 mg for adult women. What type of physical activity is best for building and maintaining healthy bones? Weight-bearing and strength-building activities are important for building and maintaining peak bone mass. Weight-bearing activities cause muscles and bones to work against gravity. Strength-building activities increases muscle strength that supports bones. Weight-bearing and muscle-building activities include:  Walking and hiking.  Jogging and running.  Dancing.  Gym exercises.  Lifting weights.  Tennis and racquetball.  Climbing stairs.  Aerobics. Adults should get at least 30 minutes of moderate physical activity on most days. Children should get at least 60 minutes of moderate physical activity on most days. Ask your health care provide what type of exercise is best for you. Where can I find more information? For more information, check out the following websites:  National Osteoporosis Foundation: http://nof.org/learn/basics  National Institutes of Health: http://www.niams.nih.gov/Health_Info/Bone/Bone_Health/bone_health_for_life.asp This information is not intended to replace advice given to you by your health care provider. Make sure you discuss any questions you have with your health care provider. Document Released: 05/02/2003 Document Revised: 08/30/2015 Document Reviewed: 02/14/2014 Elsevier Interactive Patient Education  2017 [employer]  

## 2016-04-29 LAB — LIPID PANEL
CHOLESTEROL TOTAL: 150 mg/dL (ref 100–199)
Chol/HDL Ratio: 2.5 ratio units (ref 0.0–4.4)
HDL: 61 mg/dL (ref >39)
LDL CALC: 70 mg/dL (ref 0–99)
TRIGLYCERIDES: 97 mg/dL (ref 0–149)
VLDL Cholesterol Cal: 19 mg/dL (ref 5–40)

## 2016-04-29 LAB — CMP14+EGFR
ALK PHOS: 60 IU/L (ref 39–117)
ALT: 11 IU/L (ref 0–32)
AST: 24 IU/L (ref 0–40)
Albumin/Globulin Ratio: 1.7 (ref 1.2–2.2)
Albumin: 4.5 g/dL (ref 3.5–4.7)
BUN/Creatinine Ratio: 19 (ref 12–28)
BUN: 25 mg/dL (ref 8–27)
Bilirubin Total: 0.7 mg/dL (ref 0.0–1.2)
CO2: 25 mmol/L (ref 18–29)
CREATININE: 1.31 mg/dL — AB (ref 0.57–1.00)
Calcium: 9.5 mg/dL (ref 8.7–10.3)
Chloride: 99 mmol/L (ref 96–106)
GFR calc Af Amer: 44 mL/min/{1.73_m2} — ABNORMAL LOW (ref >59)
GFR calc non Af Amer: 38 mL/min/{1.73_m2} — ABNORMAL LOW (ref >59)
GLOBULIN, TOTAL: 2.6 g/dL (ref 1.5–4.5)
GLUCOSE: 98 mg/dL (ref 65–99)
Potassium: 4.2 mmol/L (ref 3.5–5.2)
SODIUM: 139 mmol/L (ref 134–144)
Total Protein: 7.1 g/dL (ref 6.0–8.5)

## 2016-04-29 LAB — THYROID PANEL WITH TSH
Free Thyroxine Index: 2.8 (ref 1.2–4.9)
T3 UPTAKE RATIO: 34 % (ref 24–39)
T4, Total: 8.2 ug/dL (ref 4.5–12.0)
TSH: 1.52 u[IU]/mL (ref 0.450–4.500)

## 2016-06-08 DIAGNOSIS — Z85828 Personal history of other malignant neoplasm of skin: Secondary | ICD-10-CM | POA: Diagnosis not present

## 2016-06-08 DIAGNOSIS — L570 Actinic keratosis: Secondary | ICD-10-CM | POA: Diagnosis not present

## 2016-07-31 ENCOUNTER — Encounter: Payer: Self-pay | Admitting: Nurse Practitioner

## 2016-07-31 ENCOUNTER — Ambulatory Visit (INDEPENDENT_AMBULATORY_CARE_PROVIDER_SITE_OTHER): Payer: Medicare Other | Admitting: Nurse Practitioner

## 2016-07-31 VITALS — BP 128/62 | HR 55 | Temp 97.4°F | Ht 63.0 in | Wt 160.0 lb

## 2016-07-31 DIAGNOSIS — E038 Other specified hypothyroidism: Secondary | ICD-10-CM

## 2016-07-31 DIAGNOSIS — I10 Essential (primary) hypertension: Secondary | ICD-10-CM

## 2016-07-31 DIAGNOSIS — Z6828 Body mass index (BMI) 28.0-28.9, adult: Secondary | ICD-10-CM | POA: Diagnosis not present

## 2016-07-31 DIAGNOSIS — M810 Age-related osteoporosis without current pathological fracture: Secondary | ICD-10-CM

## 2016-07-31 DIAGNOSIS — E782 Mixed hyperlipidemia: Secondary | ICD-10-CM

## 2016-07-31 DIAGNOSIS — N183 Chronic kidney disease, stage 3 unspecified: Secondary | ICD-10-CM

## 2016-07-31 LAB — CMP14+EGFR
ALT: 12 IU/L (ref 0–32)
AST: 28 IU/L (ref 0–40)
Albumin/Globulin Ratio: 1.6 (ref 1.2–2.2)
Albumin: 4.2 g/dL (ref 3.5–4.7)
Alkaline Phosphatase: 63 IU/L (ref 39–117)
BUN/Creatinine Ratio: 19 (ref 12–28)
BUN: 28 mg/dL — AB (ref 8–27)
Bilirubin Total: 0.6 mg/dL (ref 0.0–1.2)
CALCIUM: 9 mg/dL (ref 8.7–10.3)
CO2: 24 mmol/L (ref 18–29)
CREATININE: 1.46 mg/dL — AB (ref 0.57–1.00)
Chloride: 102 mmol/L (ref 96–106)
GFR, EST AFRICAN AMERICAN: 39 mL/min/{1.73_m2} — AB (ref >59)
GFR, EST NON AFRICAN AMERICAN: 34 mL/min/{1.73_m2} — AB (ref >59)
GLUCOSE: 98 mg/dL (ref 65–99)
Globulin, Total: 2.6 g/dL (ref 1.5–4.5)
Potassium: 4.1 mmol/L (ref 3.5–5.2)
Sodium: 142 mmol/L (ref 134–144)
TOTAL PROTEIN: 6.8 g/dL (ref 6.0–8.5)

## 2016-07-31 LAB — LIPID PANEL
CHOL/HDL RATIO: 2 ratio (ref 0.0–4.4)
Cholesterol, Total: 122 mg/dL (ref 100–199)
HDL: 60 mg/dL (ref >39)
LDL Calculated: 44 mg/dL (ref 0–99)
Triglycerides: 91 mg/dL (ref 0–149)
VLDL CHOLESTEROL CAL: 18 mg/dL (ref 5–40)

## 2016-07-31 NOTE — Patient Instructions (Signed)
Bone Health Bones protect organs, store calcium, and anchor muscles. Good health habits, such as eating nutritious foods and exercising regularly, are important for maintaining healthy bones. They can also help to prevent a condition that causes bones to lose density and become weak and brittle (osteoporosis). Why is bone mass important? Bone mass refers to the amount of bone tissue that you have. The higher your bone mass, the stronger your bones. An important step toward having healthy bones throughout life is to have strong and dense bones during childhood. A young adult who has a high bone mass is more likely to have a high bone mass later in life. Bone mass at its greatest it is called peak bone mass. A large decline in bone mass occurs in older adults. In women, it occurs about the time of menopause. During this time, it is important to practice good health habits, because if more bone is lost than what is replaced, the bones will become less healthy and more likely to break (fracture). If you find that you have a low bone mass, you may be able to prevent osteoporosis or further bone loss by changing your diet and lifestyle. How can I find out if my bone mass is low? Bone mass can be measured with an X-ray test that is called a bone mineral density (BMD) test. This test is recommended for all women who are age 65 or older. It may also be recommended for men who are age 70 or older, or for people who are more likely to develop osteoporosis due to:  Having bones that break easily.  Having a long-term disease that weakens bones, such as kidney disease or rheumatoid arthritis.  Having menopause earlier than normal.  Taking medicine that weakens bones, such as steroids, thyroid hormones, or hormone treatment for breast cancer or prostate cancer.  Smoking.  Drinking three or more alcoholic drinks each day.  What are the nutritional recommendations for healthy bones? To have healthy bones, you  need to get enough of the right minerals and vitamins. Most nutrition experts recommend getting these nutrients from the foods that you eat. Nutritional recommendations vary from person to person. Ask your health care provider what is healthy for you. Here are some general guidelines. Calcium Recommendations Calcium is the most important (essential) mineral for bone health. Most people can get enough calcium from their diet, but supplements may be recommended for people who are at risk for osteoporosis. Good sources of calcium include:  Dairy products, such as low-fat or nonfat milk, cheese, and yogurt.  Dark green leafy vegetables, such as bok choy and broccoli.  Calcium-fortified foods, such as orange juice, cereal, bread, soy beverages, and tofu products.  Nuts, such as almonds.  Follow these recommended amounts for daily calcium intake:  Children, age 1?3: 700 mg.  Children, age 4?8: 1,000 mg.  Children, age 9?13: 1,300 mg.  Teens, age 14?18: 1,300 mg.  Adults, age 19?50: 1,000 mg.  Adults, age 51?70: ? Men: 1,000 mg. ? Women: 1,200 mg.  Adults, age 71 or older: 1,200 mg.  Pregnant and breastfeeding females: ? Teens: 1,300 mg. ? Adults: 1,000 mg.  Vitamin D Recommendations Vitamin D is the most essential vitamin for bone health. It helps the body to absorb calcium. Sunlight stimulates the skin to make vitamin D, so be sure to get enough sunlight. If you live in a cold climate or you do not get outside often, your health care provider may recommend that you take vitamin   D supplements. Good sources of vitamin D in your diet include:  Egg yolks.  Saltwater fish.  Milk and cereal fortified with vitamin D.  Follow these recommended amounts for daily vitamin D intake:  Children and teens, age 1?18: 600 international units.  Adults, age 50 or younger: 400-800 international units.  Adults, age 51 or older: 800-1,000 international units.  Other Nutrients Other nutrients  for bone health include:  Phosphorus. This mineral is found in meat, poultry, dairy foods, nuts, and legumes. The recommended daily intake for adult men and adult women is 700 mg.  Magnesium. This mineral is found in seeds, nuts, dark green vegetables, and legumes. The recommended daily intake for adult men is 400?420 mg. For adult women, it is 310?320 mg.  Vitamin K. This vitamin is found in green leafy vegetables. The recommended daily intake is 120 mg for adult men and 90 mg for adult women.  What type of physical activity is best for building and maintaining healthy bones? Weight-bearing and strength-building activities are important for building and maintaining peak bone mass. Weight-bearing activities cause muscles and bones to work against gravity. Strength-building activities increases muscle strength that supports bones. Weight-bearing and muscle-building activities include:  Walking and hiking.  Jogging and running.  Dancing.  Gym exercises.  Lifting weights.  Tennis and racquetball.  Climbing stairs.  Aerobics.  Adults should get at least 30 minutes of moderate physical activity on most days. Children should get at least 60 minutes of moderate physical activity on most days. Ask your health care provide what type of exercise is best for you. Where can I find more information? For more information, check out the following websites:  National Osteoporosis Foundation: http://nof.org/learn/basics  National Institutes of Health: http://www.niams.nih.gov/Health_Info/Bone/Bone_Health/bone_health_for_life.asp  This information is not intended to replace advice given to you by your health care provider. Make sure you discuss any questions you have with your health care provider. Document Released: 05/02/2003 Document Revised: 08/30/2015 Document Reviewed: 02/14/2014 Elsevier Interactive Patient Education  2018 [employer]  

## 2016-07-31 NOTE — Progress Notes (Signed)
Subjective:    Patient ID: Audrey Manning, female    DOB: 01/09/1933, 83 y.o.   MRN: [id number]  HPI  Audrey Manning is here today for follow up of chronic medical problem.  Outpatient Encounter Prescriptions as of 07/31/2016  Medication Sig  . alendronate (FOSAMAX) 70 MG tablet Take 1 tablet (70 mg total) by mouth every 7 (seven) days. Take with a full glass of water on an empty stomach.  Marland Kitchen amLODipine (NORVASC) 5 MG tablet TAKE ONE (1) TABLET EACH DAY  . Calcium Carbonate-Vitamin D (CALCIUM 500 + D) 500-125 MG-UNIT TABS Take 500 mg by mouth daily.  . Cholecalciferol (VITAMIN D3) 1000 units CAPS Take 1 capsule by mouth daily.  . furosemide (LASIX) 20 MG tablet Take 1 tablet (20 mg total) by mouth daily.  Marland Kitchen levothyroxine (SYNTHROID, LEVOTHROID) 50 MCG tablet TAKE ONE TABLET BY MOUTH ONCE DAILY BEFORE BREAKFAST  . meclizine (ANTIVERT) 25 MG tablet Take 1 tablet (25 mg total) by mouth 3 (three) times daily as needed for dizziness.  . metoprolol (LOPRESSOR) 50 MG tablet Take 1 tablet (50 mg total) by mouth 2 (two) times daily.  . simvastatin (ZOCOR) 20 MG tablet Take 1 tablet (20 mg total) by mouth at bedtime.  . triamcinolone cream (KENALOG) 0.1 % APPLY TWICE DAILY   No facility-administered encounter medications on file as of 07/31/2016.     1. Essential hypertension  No c/o chest pain,SOB or HA- does not check blood pressures at home  2. Other specified hypothyroidism  No problems that she is aware of  3. Age-related osteoporosis without current pathological fracture  Does very little weight bearing exercise  4. CKD (chronic kidney disease) stage 3, GFR 30-59 ml/min  Currently just watching labs  5. Mixed hyperlipidemia  Does not watch diet  6. BMI 28.0-28.9,adult  No recent weight changes    New complaints: None today     Review of Systems  Constitutional: Negative for activity change and appetite change.  HENT: Negative.   Eyes: Negative for pain.  Respiratory: Negative  for shortness of breath.   Cardiovascular: Negative for chest pain, palpitations and leg swelling.  Gastrointestinal: Negative for abdominal pain.  Endocrine: Negative for polydipsia.  Musculoskeletal: Negative.   Skin: Negative for rash.  Neurological: Negative for dizziness, weakness and headaches.  Hematological: Does not bruise/bleed easily.  Psychiatric/Behavioral: Negative.   All other systems reviewed and are negative.      Objective:   Physical Exam  Constitutional: She is oriented to person, place, and time. She appears well-developed and well-nourished.  HENT:  Nose: Nose normal.  Mouth/Throat: Oropharynx is clear and moist.  Eyes: EOM are normal.  Neck: Trachea normal, normal range of motion and full passive range of motion without pain. Neck supple. No JVD present. Carotid bruit is not present. No thyromegaly present.  Cardiovascular: Normal rate, regular rhythm, normal heart sounds and intact distal pulses.  Exam reveals no gallop and no friction rub.   No murmur heard. Pulmonary/Chest: Effort normal and breath sounds normal.  Abdominal: Soft. Bowel sounds are normal. She exhibits no distension and no mass. There is no tenderness.  Musculoskeletal: Normal range of motion. She exhibits edema (1+ left lower ext- wearing compresion socks).  Lymphadenopathy:    She has no cervical adenopathy.  Neurological: She is alert and oriented to person, place, and time. She has normal reflexes.  Skin: Skin is warm and dry.  Psychiatric: She has a normal mood and affect. Her behavior is normal.  Judgment and thought content normal.    BP 128/62   Pulse (!) 55   Temp 97.4 F (36.3 C) (Oral)   Ht _0  (1.6 m)   Wt 160 lb (72.6 kg)   BMI 28.34 kg/m        Assessment & Plan:  1. Essential hypertension Low sodium diet - CMP14+EGFR  2. Other specified hypothyroidism  3. Age-related osteoporosis without current pathological fracture Weight bearing exercise encouraged - DG  [facility] DEXA  4. CKD (chronic kidney disease) stage 3, GFR 30-59 ml/min Watching labs  5. Mixed hyperlipidemia Low fta diet - Lipid panel  6. BMI 28.0-28.9,adult Discussed diet and exercise for person with BMI >25 Will recheck weight in 3-6 months    Labs pending Health maintenance reviewed Diet and exercise encouraged Continue all meds Follow up  In 3 months   [place], FNP

## 2016-11-05 ENCOUNTER — Encounter: Payer: Self-pay | Admitting: Nurse Practitioner

## 2016-11-05 ENCOUNTER — Ambulatory Visit (INDEPENDENT_AMBULATORY_CARE_PROVIDER_SITE_OTHER): Payer: Medicare Other | Admitting: Nurse Practitioner

## 2016-11-05 VITALS — BP 136/69 | HR 59 | Temp 97.0°F | Ht 63.0 in | Wt 164.0 lb

## 2016-11-05 DIAGNOSIS — Z6828 Body mass index (BMI) 28.0-28.9, adult: Secondary | ICD-10-CM | POA: Diagnosis not present

## 2016-11-05 DIAGNOSIS — E782 Mixed hyperlipidemia: Secondary | ICD-10-CM

## 2016-11-05 DIAGNOSIS — M810 Age-related osteoporosis without current pathological fracture: Secondary | ICD-10-CM | POA: Diagnosis not present

## 2016-11-05 DIAGNOSIS — I10 Essential (primary) hypertension: Secondary | ICD-10-CM | POA: Diagnosis not present

## 2016-11-05 DIAGNOSIS — N183 Chronic kidney disease, stage 3 unspecified: Secondary | ICD-10-CM

## 2016-11-05 DIAGNOSIS — E038 Other specified hypothyroidism: Secondary | ICD-10-CM | POA: Diagnosis not present

## 2016-11-05 MED ORDER — AMLODIPINE BESYLATE 5 MG PO TABS: ORAL_TABLET | ORAL | 1 refills | Status: DC

## 2016-11-05 MED ORDER — LEVOTHYROXINE SODIUM 50 MCG PO TABS: ORAL_TABLET | ORAL | 1 refills | Status: DC

## 2016-11-05 MED ORDER — METOPROLOL TARTRATE 50 MG PO TABS: 50.0000 mg | ORAL_TABLET | Freq: Two times a day (BID) | ORAL | 1 refills | Status: DC

## 2016-11-05 MED ORDER — FUROSEMIDE 20 MG PO TABS: 20.0000 mg | ORAL_TABLET | Freq: Every day | ORAL | 1 refills | Status: DC

## 2016-11-05 MED ORDER — SIMVASTATIN 20 MG PO TABS: 20.0000 mg | ORAL_TABLET | Freq: Every day | ORAL | 1 refills | Status: DC

## 2016-11-05 MED ORDER — ALENDRONATE SODIUM 70 MG PO TABS: 70.0000 mg | ORAL_TABLET | ORAL | 1 refills | Status: DC

## 2016-11-05 NOTE — Patient Instructions (Signed)
Bone Health Bones protect organs, store calcium, and anchor muscles. Good health habits, such as eating nutritious foods and exercising regularly, are important for maintaining healthy bones. They can also help to prevent a condition that causes bones to lose density and become weak and brittle (osteoporosis). Why is bone mass important? Bone mass refers to the amount of bone tissue that you have. The higher your bone mass, the stronger your bones. An important step toward having healthy bones throughout life is to have strong and dense bones during childhood. A young adult who has a high bone mass is more likely to have a high bone mass later in life. Bone mass at its greatest it is called peak bone mass. A large decline in bone mass occurs in older adults. In women, it occurs about the time of menopause. During this time, it is important to practice good health habits, because if more bone is lost than what is replaced, the bones will become less healthy and more likely to break (fracture). If you find that you have a low bone mass, you may be able to prevent osteoporosis or further bone loss by changing your diet and lifestyle. How can I find out if my bone mass is low? Bone mass can be measured with an X-ray test that is called a bone mineral density (BMD) test. This test is recommended for all women who are age 65 or older. It may also be recommended for men who are age 70 or older, or for people who are more likely to develop osteoporosis due to:  Having bones that break easily.  Having a long-term disease that weakens bones, such as kidney disease or rheumatoid arthritis.  Having menopause earlier than normal.  Taking medicine that weakens bones, such as steroids, thyroid hormones, or hormone treatment for breast cancer or prostate cancer.  Smoking.  Drinking three or more alcoholic drinks each day.  What are the nutritional recommendations for healthy bones? To have healthy bones, you  need to get enough of the right minerals and vitamins. Most nutrition experts recommend getting these nutrients from the foods that you eat. Nutritional recommendations vary from person to person. Ask your health care provider what is healthy for you. Here are some general guidelines. Calcium Recommendations Calcium is the most important (essential) mineral for bone health. Most people can get enough calcium from their diet, but supplements may be recommended for people who are at risk for osteoporosis. Good sources of calcium include:  Dairy products, such as low-fat or nonfat milk, cheese, and yogurt.  Dark green leafy vegetables, such as bok choy and broccoli.  Calcium-fortified foods, such as orange juice, cereal, bread, soy beverages, and tofu products.  Nuts, such as almonds.  Follow these recommended amounts for daily calcium intake:  Children, age 1?3: 700 mg.  Children, age 4?8: 1,000 mg.  Children, age 9?13: 1,300 mg.  Teens, age 14?18: 1,300 mg.  Adults, age 19?50: 1,000 mg.  Adults, age 51?70: ? Men: 1,000 mg. ? Women: 1,200 mg.  Adults, age 71 or older: 1,200 mg.  Pregnant and breastfeeding females: ? Teens: 1,300 mg. ? Adults: 1,000 mg.  Vitamin D Recommendations Vitamin D is the most essential vitamin for bone health. It helps the body to absorb calcium. Sunlight stimulates the skin to make vitamin D, so be sure to get enough sunlight. If you live in a cold climate or you do not get outside often, your health care provider may recommend that you take vitamin   D supplements. Good sources of vitamin D in your diet include:  Egg yolks.  Saltwater fish.  Milk and cereal fortified with vitamin D.  Follow these recommended amounts for daily vitamin D intake:  Children and teens, age 1?18: 600 international units.  Adults, age 50 or younger: 400-800 international units.  Adults, age 51 or older: 800-1,000 international units.  Other Nutrients Other nutrients  for bone health include:  Phosphorus. This mineral is found in meat, poultry, dairy foods, nuts, and legumes. The recommended daily intake for adult men and adult women is 700 mg.  Magnesium. This mineral is found in seeds, nuts, dark green vegetables, and legumes. The recommended daily intake for adult men is 400?420 mg. For adult women, it is 310?320 mg.  Vitamin K. This vitamin is found in green leafy vegetables. The recommended daily intake is 120 mg for adult men and 90 mg for adult women.  What type of physical activity is best for building and maintaining healthy bones? Weight-bearing and strength-building activities are important for building and maintaining peak bone mass. Weight-bearing activities cause muscles and bones to work against gravity. Strength-building activities increases muscle strength that supports bones. Weight-bearing and muscle-building activities include:  Walking and hiking.  Jogging and running.  Dancing.  Gym exercises.  Lifting weights.  Tennis and racquetball.  Climbing stairs.  Aerobics.  Adults should get at least 30 minutes of moderate physical activity on most days. Children should get at least 60 minutes of moderate physical activity on most days. Ask your health care provide what type of exercise is best for you. Where can I find more information? For more information, check out the following websites:  National Osteoporosis Foundation: http://nof.org/learn/basics  National Institutes of Health: http://www.niams.nih.gov/Health_Info/Bone/Bone_Health/bone_health_for_life.asp  This information is not intended to replace advice given to you by your health care provider. Make sure you discuss any questions you have with your health care provider. Document Released: 05/02/2003 Document Revised: 08/30/2015 Document Reviewed: 02/14/2014 Elsevier Interactive Patient Education  2018 [employer]  

## 2016-11-05 NOTE — Progress Notes (Signed)
Subjective:    Patient ID: Audrey Manning, female    DOB: [birth year], 83 y.o.   MRN: [id number]  HPI  Ziaire Bieser is here today for follow up of chronic medical problem.  Outpatient Encounter Prescriptions as of 11/05/2016  Medication Sig  . alendronate (FOSAMAX) 70 MG tablet Take 1 tablet (70 mg total) by mouth every 7 (seven) days. Take with a full glass of water on an empty stomach.  Marland Kitchen amLODipine (NORVASC) 5 MG tablet TAKE ONE (1) TABLET EACH DAY  . Calcium Carbonate-Vitamin D (CALCIUM 500 + D) 500-125 MG-UNIT TABS Take 500 mg by mouth daily.  . Cholecalciferol (VITAMIN D3) 1000 units CAPS Take 1 capsule by mouth daily.  . furosemide (LASIX) 20 MG tablet Take 1 tablet (20 mg total) by mouth daily.  Marland Kitchen levothyroxine (SYNTHROID, LEVOTHROID) 50 MCG tablet TAKE ONE TABLET BY MOUTH ONCE DAILY BEFORE BREAKFAST  . meclizine (ANTIVERT) 25 MG tablet Take 1 tablet (25 mg total) by mouth 3 (three) times daily as needed for dizziness.  . metoprolol (LOPRESSOR) 50 MG tablet Take 1 tablet (50 mg total) by mouth 2 (two) times daily.  . simvastatin (ZOCOR) 20 MG tablet Take 1 tablet (20 mg total) by mouth at bedtime.  . triamcinolone cream (KENALOG) 0.1 % APPLY TWICE DAILY   No facility-administered encounter medications on file as of 11/05/2016.     1. Essential hypertension  No c/o chest pain, sob or headache. Does not check blood pressure at home  2. Other specified hypothyroidism  No problems that she is aware of  3. Age-related osteoporosis without current pathological fracture Does no exercise   4. CKD (chronic kidney disease) stage 3, GFR 30-59 ml/min  currently just watching labs  5. Mixed hyperlipidemia  Does not watch diet at all  6. BMI 28.0-28.9,adult   no recent weight changes    New complaints: None today.  Social history:     Review of Systems  Constitutional: Negative for activity change, appetite change and fatigue.  Respiratory: Negative for cough and shortness of  breath.   Cardiovascular: Negative for chest pain and palpitations.  Neurological: Negative for dizziness, light-headedness and headaches.  All other systems reviewed and are negative.      Objective:   Physical Exam  Constitutional: She is oriented to person, place, and time. She appears well-developed and well-nourished. No distress.  HENT:  Head: Normocephalic.  Right Ear: External ear normal.  Left Ear: External ear normal.  Mouth/Throat: Oropharynx is clear and moist.  Eyes: Pupils are equal, round, and reactive to light.  Neck: Normal range of motion. Neck supple. No thyromegaly present.  Cardiovascular: Normal rate, regular rhythm, normal heart sounds and intact distal pulses.   No murmur heard. Pulmonary/Chest: Effort normal and breath sounds normal. No respiratory distress. She has no wheezes.  Abdominal: Soft. Bowel sounds are normal. She exhibits no distension. There is no tenderness.  Musculoskeletal: Normal range of motion. She exhibits edema (nonpitting right lower extremity edema).  Lymphadenopathy:    She has no cervical adenopathy.  Neurological: She is alert and oriented to person, place, and time.  Skin: Skin is warm and dry.  Psychiatric: She has a normal mood and affect. Her behavior is normal.   BP 136/69   Pulse (!) 59   Temp (!) 97 F (36.1 C) (Oral)   Ht _0  (1.6 m)   Wt 164 lb (74.4 kg)   BMI 29.05 kg/m      Assessment & Plan:  1. Essential hypertension Low sodium diet - metoprolol tartrate (LOPRESSOR) 50 MG tablet; Take 1 tablet (50 mg total) by mouth 2 (two) times daily.  Dispense: 180 tablet; Refill: 1 - furosemide (LASIX) 20 MG tablet; Take 1 tablet (20 mg total) by mouth daily.  Dispense: 90 tablet; Refill: 1 - amLODipine (NORVASC) 5 MG tablet; TAKE ONE (1) TABLET EACH DAY  Dispense: 90 tablet; Refill: 1 - CMP14+EGFR  2. Other specified hypothyroidism - levothyroxine (SYNTHROID, LEVOTHROID) 50 MCG tablet; TAKE ONE TABLET BY MOUTH ONCE  DAILY BEFORE BREAKFAST  Dispense: 90 tablet; Refill: 1 - TSH  3. Age-related osteoporosis without current pathological fracture Weight bearing exercises encouraged - alendronate (FOSAMAX) 70 MG tablet; Take 1 tablet (70 mg total) by mouth every 7 (seven) days. Take with a full glass of water on an empty stomach.  Dispense: 12 tablet; Refill: 1  4. CKD (chronic kidney disease) stage 3, GFR 30-59 ml/min Will continue to watch labs  5. Mixed hyperlipidemia Low fat diet - simvastatin (ZOCOR) 20 MG tablet; Take 1 tablet (20 mg total) by mouth at bedtime.  Dispense: 90 tablet; Refill: 1 - Lipid panel  6. BMI 28.0-28.9,adult Discussed diet and exercise for person with BMI >25 Will recheck weight in 3-6 months    Labs pending Health maintenance reviewed Diet and exercise encouraged Continue all meds Follow up  In 3 months   [place], FNP

## 2016-11-06 LAB — LIPID PANEL
CHOL/HDL RATIO: 2.3 ratio (ref 0.0–4.4)
Cholesterol, Total: 142 mg/dL (ref 100–199)
HDL: 61 mg/dL (ref >39)
LDL Calculated: 59 mg/dL (ref 0–99)
Triglycerides: 112 mg/dL (ref 0–149)
VLDL Cholesterol Cal: 22 mg/dL (ref 5–40)

## 2016-11-06 LAB — CMP14+EGFR
A/G RATIO: 1.7 (ref 1.2–2.2)
ALT: 11 IU/L (ref 0–32)
AST: 26 IU/L (ref 0–40)
Albumin: 4.5 g/dL (ref 3.5–4.7)
Alkaline Phosphatase: 58 IU/L (ref 39–117)
BUN / CREAT RATIO: 19 (ref 12–28)
BUN: 27 mg/dL (ref 8–27)
Bilirubin Total: 0.6 mg/dL (ref 0.0–1.2)
CALCIUM: 9.4 mg/dL (ref 8.7–10.3)
CO2: 25 mmol/L (ref 20–29)
Chloride: 102 mmol/L (ref 96–106)
Creatinine, Ser: 1.4 mg/dL — ABNORMAL HIGH (ref 0.57–1.00)
GFR, EST AFRICAN AMERICAN: 41 mL/min/{1.73_m2} — AB (ref >59)
GFR, EST NON AFRICAN AMERICAN: 35 mL/min/{1.73_m2} — AB (ref >59)
GLOBULIN, TOTAL: 2.7 g/dL (ref 1.5–4.5)
Glucose: 103 mg/dL — ABNORMAL HIGH (ref 65–99)
POTASSIUM: 4.3 mmol/L (ref 3.5–5.2)
SODIUM: 141 mmol/L (ref 134–144)
TOTAL PROTEIN: 7.2 g/dL (ref 6.0–8.5)

## 2016-11-06 LAB — TSH: TSH: 2.31 u[IU]/mL (ref 0.450–4.500)

## 2016-11-30 ENCOUNTER — Other Ambulatory Visit (INDEPENDENT_AMBULATORY_CARE_PROVIDER_SITE_OTHER): Payer: Medicare Other

## 2016-11-30 ENCOUNTER — Ambulatory Visit (INDEPENDENT_AMBULATORY_CARE_PROVIDER_SITE_OTHER): Payer: Medicare Other

## 2016-11-30 ENCOUNTER — Ambulatory Visit (INDEPENDENT_AMBULATORY_CARE_PROVIDER_SITE_OTHER): Payer: Medicare Other | Admitting: *Deleted

## 2016-11-30 DIAGNOSIS — Z23 Encounter for immunization: Secondary | ICD-10-CM

## 2016-11-30 DIAGNOSIS — M810 Age-related osteoporosis without current pathological fracture: Secondary | ICD-10-CM | POA: Diagnosis not present

## 2016-11-30 DIAGNOSIS — Z0000 Encounter for general adult medical examination without abnormal findings: Secondary | ICD-10-CM | POA: Diagnosis not present

## 2016-11-30 NOTE — Patient Instructions (Signed)
Please work on doing chair exercises 3 times per week for 15-20 minutes per session.   Please review the information given on Advanced Directives (healthcare power of attorney) if you decide to put these in place, please bring our office a copy to file in your chart.  Thank you for coming in for your Annual Wellness Visit today!   Preventive Care 4 Years and Older, Female Preventive care refers to lifestyle choices and visits with your health care provider that can promote health and wellness. What does preventive care include?  A yearly physical exam. This is also called an annual well check.  Dental exams once or twice a year.  Routine eye exams. Ask your health care provider how often you should have your eyes checked.  Personal lifestyle choices, including: ? Daily care of your teeth and gums. ? Regular physical activity. ? Eating a healthy diet. ? Avoiding tobacco and drug use. ? Limiting alcohol use. ? Practicing safe sex. ? Taking low-dose aspirin every day. ? Taking vitamin and mineral supplements as recommended by your health care provider. What happens during an annual well check? The services and screenings done by your health care provider during your annual well check will depend on your age, overall health, lifestyle risk factors, and family history of disease. Counseling Your health care provider may ask you questions about your:  Alcohol use.  Tobacco use.  Drug use.  Emotional well-being.  Home and relationship well-being.  Sexual activity.  Eating habits.  History of falls.  Memory and ability to understand (cognition).  Work and work Statistician.  Reproductive health.  Screening You may have the following tests or measurements:  Height, weight, and BMI.  Blood pressure.  Lipid and cholesterol levels. These may be checked every 5 years, or more frequently if you are over 38 years old.  Skin check.  Lung cancer screening. You may have  this screening every year starting at age 73 if you have a 30-pack-year history of smoking and currently smoke or have quit within the past 15 years.  Fecal occult blood test (FOBT) of the stool. You may have this test every year starting at age 75.  Flexible sigmoidoscopy or colonoscopy. You may have a sigmoidoscopy every 5 years or a colonoscopy every 10 years starting at age 30.  Hepatitis C blood test.  Hepatitis B blood test.  Sexually transmitted disease (STD) testing.  Diabetes screening. This is done by checking your blood sugar (glucose) after you have not eaten for a while (fasting). You may have this done every 1-3 years.  Bone density scan. This is done to screen for osteoporosis. You may have this done starting at age 65.  Mammogram. This may be done every 1-2 years. Talk to your health care provider about how often you should have regular mammograms.  Talk with your health care provider about your test results, treatment options, and if necessary, the need for more tests. Vaccines Your health care provider may recommend certain vaccines, such as:  Influenza vaccine. This is recommended every year.  Tetanus, diphtheria, and acellular pertussis (Tdap, Td) vaccine. You may need a Td booster every 10 years.  Varicella vaccine. You may need this if you have not been vaccinated.  Zoster vaccine. You may need this after age 66.  Measles, mumps, and rubella (MMR) vaccine. You may need at least one dose of MMR if you were born in 1957 or later. You may also need a second dose.  Pneumococcal 13-valent  conjugate (PCV13) vaccine. One dose is recommended after age 61.  Pneumococcal polysaccharide (PPSV23) vaccine. One dose is recommended after age 91.  Meningococcal vaccine. You may need this if you have certain conditions.  Hepatitis A vaccine. You may need this if you have certain conditions or if you travel or work in places where you may be exposed to hepatitis  A.  Hepatitis B vaccine. You may need this if you have certain conditions or if you travel or work in places where you may be exposed to hepatitis B.  Haemophilus influenzae type b (Hib) vaccine. You may need this if you have certain conditions.  Talk to your health care provider about which screenings and vaccines you need and how often you need them. This information is not intended to replace advice given to you by your health care provider. Make sure you discuss any questions you have with your health care provider. Document Released: 03/08/2015 Document Revised: 10/30/2015 Document Reviewed: 12/11/2014 Elsevier Interactive Patient Education  2017 [employer].

## 2016-12-01 ENCOUNTER — Encounter: Payer: Self-pay | Admitting: *Deleted
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
## 2016-12-07 DIAGNOSIS — L570 Actinic keratosis: Secondary | ICD-10-CM | POA: Diagnosis not present

## 2016-12-07 DIAGNOSIS — L821 Other seborrheic keratosis: Secondary | ICD-10-CM | POA: Diagnosis not present

## 2016-12-07 DIAGNOSIS — Z85828 Personal history of other malignant neoplasm of skin: Secondary | ICD-10-CM | POA: Diagnosis not present

## 2017-01-04 ENCOUNTER — Other Ambulatory Visit: Payer: Self-pay | Admitting: Nurse Practitioner

## 2017-02-05 ENCOUNTER — Ambulatory Visit (INDEPENDENT_AMBULATORY_CARE_PROVIDER_SITE_OTHER): Payer: Medicare Other | Admitting: Nurse Practitioner

## 2017-02-05 ENCOUNTER — Ambulatory Visit (INDEPENDENT_AMBULATORY_CARE_PROVIDER_SITE_OTHER): Payer: Medicare Other

## 2017-02-05 ENCOUNTER — Encounter: Payer: Self-pay | Admitting: Nurse Practitioner

## 2017-02-05 VITALS — BP 141/66 | HR 58 | Temp 97.4°F | Ht 63.0 in | Wt 159.0 lb

## 2017-02-05 DIAGNOSIS — Z6828 Body mass index (BMI) 28.0-28.9, adult: Secondary | ICD-10-CM | POA: Diagnosis not present

## 2017-02-05 DIAGNOSIS — E038 Other specified hypothyroidism: Secondary | ICD-10-CM | POA: Diagnosis not present

## 2017-02-05 DIAGNOSIS — J309 Allergic rhinitis, unspecified: Secondary | ICD-10-CM

## 2017-02-05 DIAGNOSIS — E782 Mixed hyperlipidemia: Secondary | ICD-10-CM | POA: Diagnosis not present

## 2017-02-05 DIAGNOSIS — I10 Essential (primary) hypertension: Secondary | ICD-10-CM

## 2017-02-05 DIAGNOSIS — M810 Age-related osteoporosis without current pathological fracture: Secondary | ICD-10-CM

## 2017-02-05 DIAGNOSIS — N183 Chronic kidney disease, stage 3 unspecified: Secondary | ICD-10-CM

## 2017-02-05 MED ORDER — ALENDRONATE SODIUM 70 MG PO TABS
70.0000 mg | ORAL_TABLET | ORAL | 1 refills | Status: DC
Start: 2017-02-05 — End: 2017-08-16

## 2017-02-05 MED ORDER — FUROSEMIDE 20 MG PO TABS: 20.0000 mg | ORAL_TABLET | Freq: Every day | ORAL | 1 refills | Status: DC

## 2017-02-05 MED ORDER — METOPROLOL TARTRATE 50 MG PO TABS: 50.0000 mg | ORAL_TABLET | Freq: Two times a day (BID) | ORAL | 1 refills | Status: DC

## 2017-02-05 MED ORDER — SIMVASTATIN 20 MG PO TABS: 20.0000 mg | ORAL_TABLET | Freq: Every day | ORAL | 1 refills | Status: DC

## 2017-02-05 MED ORDER — LEVOTHYROXINE SODIUM 50 MCG PO TABS: ORAL_TABLET | ORAL | 1 refills | Status: DC

## 2017-02-05 MED ORDER — AMLODIPINE BESYLATE 5 MG PO TABS: ORAL_TABLET | ORAL | 1 refills | Status: DC

## 2017-02-05 NOTE — Patient Instructions (Signed)
Bone Health Bones protect organs, store calcium, and anchor muscles. Good health habits, such as eating nutritious foods and exercising regularly, are important for maintaining healthy bones. They can also help to prevent a condition that causes bones to lose density and become weak and brittle (osteoporosis). Why is bone mass important? Bone mass refers to the amount of bone tissue that you have. The higher your bone mass, the stronger your bones. An important step toward having healthy bones throughout life is to have strong and dense bones during childhood. A young adult who has a high bone mass is more likely to have a high bone mass later in life. Bone mass at its greatest it is called peak bone mass. A large decline in bone mass occurs in older adults. In women, it occurs about the time of menopause. During this time, it is important to practice good health habits, because if more bone is lost than what is replaced, the bones will become less healthy and more likely to break (fracture). If you find that you have a low bone mass, you may be able to prevent osteoporosis or further bone loss by changing your diet and lifestyle. How can I find out if my bone mass is low? Bone mass can be measured with an X-ray test that is called a bone mineral density (BMD) test. This test is recommended for all women who are age 65 or older. It may also be recommended for men who are age 70 or older, or for people who are more likely to develop osteoporosis due to:  Having bones that break easily.  Having a long-term disease that weakens bones, such as kidney disease or rheumatoid arthritis.  Having menopause earlier than normal.  Taking medicine that weakens bones, such as steroids, thyroid hormones, or hormone treatment for breast cancer or prostate cancer.  Smoking.  Drinking three or more alcoholic drinks each day.  What are the nutritional recommendations for healthy bones? To have healthy bones, you  need to get enough of the right minerals and vitamins. Most nutrition experts recommend getting these nutrients from the foods that you eat. Nutritional recommendations vary from person to person. Ask your health care provider what is healthy for you. Here are some general guidelines. Calcium Recommendations Calcium is the most important (essential) mineral for bone health. Most people can get enough calcium from their diet, but supplements may be recommended for people who are at risk for osteoporosis. Good sources of calcium include:  Dairy products, such as low-fat or nonfat milk, cheese, and yogurt.  Dark green leafy vegetables, such as bok choy and broccoli.  Calcium-fortified foods, such as orange juice, cereal, bread, soy beverages, and tofu products.  Nuts, such as almonds.  Follow these recommended amounts for daily calcium intake:  Children, age 1?3: 700 mg.  Children, age 4?8: 1,000 mg.  Children, age 9?13: 1,300 mg.  Teens, age 14?18: 1,300 mg.  Adults, age 19?50: 1,000 mg.  Adults, age 51?70: ? Men: 1,000 mg. ? Women: 1,200 mg.  Adults, age 71 or older: 1,200 mg.  Pregnant and breastfeeding females: ? Teens: 1,300 mg. ? Adults: 1,000 mg.  Vitamin D Recommendations Vitamin D is the most essential vitamin for bone health. It helps the body to absorb calcium. Sunlight stimulates the skin to make vitamin D, so be sure to get enough sunlight. If you live in a cold climate or you do not get outside often, your health care provider may recommend that you take vitamin   D supplements. Good sources of vitamin D in your diet include:  Egg yolks.  Saltwater fish.  Milk and cereal fortified with vitamin D.  Follow these recommended amounts for daily vitamin D intake:  Children and teens, age 1?18: 600 international units.  Adults, age 50 or younger: 400-800 international units.  Adults, age 51 or older: 800-1,000 international units.  Other Nutrients Other nutrients  for bone health include:  Phosphorus. This mineral is found in meat, poultry, dairy foods, nuts, and legumes. The recommended daily intake for adult men and adult women is 700 mg.  Magnesium. This mineral is found in seeds, nuts, dark green vegetables, and legumes. The recommended daily intake for adult men is 400?420 mg. For adult women, it is 310?320 mg.  Vitamin K. This vitamin is found in green leafy vegetables. The recommended daily intake is 120 mg for adult men and 90 mg for adult women.  What type of physical activity is best for building and maintaining healthy bones? Weight-bearing and strength-building activities are important for building and maintaining peak bone mass. Weight-bearing activities cause muscles and bones to work against gravity. Strength-building activities increases muscle strength that supports bones. Weight-bearing and muscle-building activities include:  Walking and hiking.  Jogging and running.  Dancing.  Gym exercises.  Lifting weights.  Tennis and racquetball.  Climbing stairs.  Aerobics.  Adults should get at least 30 minutes of moderate physical activity on most days. Children should get at least 60 minutes of moderate physical activity on most days. Ask your health care provide what type of exercise is best for you. Where can I find more information? For more information, check out the following websites:  National Osteoporosis Foundation: http://nof.org/learn/basics  National Institutes of Health: http://www.niams.nih.gov/Health_Info/Bone/Bone_Health/bone_health_for_life.asp  This information is not intended to replace advice given to you by your health care provider. Make sure you discuss any questions you have with your health care provider. Document Released: 05/02/2003 Document Revised: 08/30/2015 Document Reviewed: 02/14/2014 Elsevier Interactive Patient Education  2018 [employer]  

## 2017-02-05 NOTE — Progress Notes (Addendum)
Subjective:    Patient ID: Audrey Manning, female    DOB: [birth year], 83 y.o.   MRN: [id number]  HPI  Precious Segall is here today for follow up of chronic medical problem.  Outpatient Encounter Medications as of 02/05/2017  Medication Sig  . alendronate (FOSAMAX) 70 MG tablet Take 1 tablet (70 mg total) by mouth every 7 (seven) days. Take with a full glass of water on an empty stomach.  Marland Kitchen amLODipine (NORVASC) 5 MG tablet TAKE ONE (1) TABLET EACH DAY  . Calcium Carbonate-Vitamin D (CALCIUM 500 + D) 500-125 MG-UNIT TABS Take 500 mg by mouth daily.  . Cholecalciferol (VITAMIN D3) 1000 units CAPS Take 1 capsule by mouth daily.  . furosemide (LASIX) 20 MG tablet Take 1 tablet (20 mg total) by mouth daily.  Marland Kitchen levothyroxine (SYNTHROID, LEVOTHROID) 50 MCG tablet TAKE ONE TABLET BY MOUTH ONCE DAILY BEFORE BREAKFAST  . meclizine (ANTIVERT) 25 MG tablet Take 1 tablet (25 mg total) by mouth 3 (three) times daily as needed for dizziness.  . metoprolol tartrate (LOPRESSOR) 50 MG tablet Take 1 tablet (50 mg total) by mouth 2 (two) times daily.  . simvastatin (ZOCOR) 20 MG tablet Take 1 tablet (20 mg total) by mouth at bedtime.  . triamcinolone cream (KENALOG) 0.1 % APPLY TWICE DAILY     1. Essential hypertension  No c/o chest pain, SOB or headache. Does not check bloodpressure at home very often but when she does her systolic is in the 329'J. BP Readings from Last 3 Encounters:  02/05/17 (!) 141/66  12/01/16 133/61  11/05/16 136/69     2. Allergic rhinitis, unspecified seasonality, unspecified trigger  Not bothering her right now  3. Other specified hypothyroidism  No problems that she is awareof  4. Age-related osteoporosis without current pathological fracture  No c/o back pain  5. CKD (chronic kidney disease) stage 3, GFR 30-59 ml/min (HCC)  currently just watching labs  6. Mixed hyperlipidemia  Not watching diet  7. BMI 28.0-28.9,adult  No weight changes    New complaints: No  complaints today  Social history: No longer working- lives with husband of 15 years    Review of Systems  Constitutional: Negative for activity change and appetite change.  HENT: Negative.   Eyes: Negative for pain.  Respiratory: Negative for shortness of breath.   Cardiovascular: Negative for chest pain, palpitations and leg swelling.  Gastrointestinal: Negative for abdominal pain.  Endocrine: Negative for polydipsia.  Genitourinary: Negative.   Skin: Negative for rash.  Neurological: Negative for dizziness, weakness and headaches.  Hematological: Does not bruise/bleed easily.  Psychiatric/Behavioral: Negative.   All other systems reviewed and are negative.      Objective:   Physical Exam  Constitutional: She is oriented to person, place, and time. She appears well-developed and well-nourished.  HENT:  Nose: Nose normal.  Mouth/Throat: Oropharynx is clear and moist.  Eyes: EOM are normal.  Neck: Trachea normal, normal range of motion and full passive range of motion without pain. Neck supple. No JVD present. Carotid bruit is not present. No thyromegaly present.  Cardiovascular: Normal rate, regular rhythm, normal heart sounds and intact distal pulses. Exam reveals no gallop and no friction rub.  No murmur heard. Pulmonary/Chest: Effort normal and breath sounds normal.  Abdominal: Soft. Bowel sounds are normal. She exhibits no distension and no mass. There is no tenderness.  Musculoskeletal: Normal range of motion. She exhibits edema (1+ edema right lower ext).  Lymphadenopathy:    She has  no cervical adenopathy.  Neurological: She is alert and oriented to person, place, and time. She has normal reflexes.  Skin: Skin is warm and dry.  Psychiatric: She has a normal mood and affect. Her behavior is normal. Judgment and thought content normal.   BP (!) 141/66   Pulse (!) 58   Temp (!) 97.4 F (36.3 C) (Oral)   Ht _0  (1.6 m)   Wt 159 lb (72.1 kg)   BMI 28.17 kg/m    chest x ray- no cardiopulmonary disease-Preliminary reading by Ronnald Collum, FNP  [facility]  EKG- sinus bradycrdia-[name] Done, FNP     Assessment & Plan:  1. Essential hypertension Low soium diet - metoprolol tartrate (LOPRESSOR) 50 MG tablet; Take 1 tablet (50 mg total) by mouth 2 (two) times daily.  Dispense: 180 tablet; Refill: 1 - furosemide (LASIX) 20 MG tablet; Take 1 tablet (20 mg total) by mouth daily.  Dispense: 90 tablet; Refill: 1 - amLODipine (NORVASC) 5 MG tablet; TAKE ONE (1) TABLET EACH DAY  Dispense: 90 tablet; Refill: 1 - CMP14+EGFR  2. Allergic rhinitis, unspecified seasonality, unspecified trigger  3. Other specified hypothyroidism - levothyroxine (SYNTHROID, LEVOTHROID) 50 MCG tablet; TAKE ONE TABLET BY MOUTH ONCE DAILY BEFORE BREAKFAST  Dispense: 90 tablet; Refill: 1  4. Age-related osteoporosis without current pathological fracture weight bearing exercises - alendronate (FOSAMAX) 70 MG tablet; Take 1 tablet (70 mg total) by mouth every 7 (seven) days. Take with a full glass of water on an empty stomach.  Dispense: 12 tablet; Refill: 1  5. CKD (chronic kidney disease) stage 3, GFR 30-59 ml/min (HCC) Labs pending  6. Mixed hyperlipidemia Low fat diet - simvastatin (ZOCOR) 20 MG tablet; Take 1 tablet (20 mg total) by mouth at bedtime.  Dispense: 90 tablet; Refill: 1 - Lipid panel  7. BMI 28.0-28.9,adult Discussed diet and exercise for person with BMI >25 Will recheck weight in 3-6 months     Labs pending Health maintenance reviewed Diet and exercise encouraged Continue all meds Follow up  In 3 months   [place], FNP

## 2017-02-05 NOTE — Addendum Note (Signed)
Addended by: [name] on: 02/05/2017 11:20 AM   Modules accepted: Orders

## 2017-02-06 LAB — CMP14+EGFR
A/G RATIO: 1.7 (ref 1.2–2.2)
ALBUMIN: 4.7 g/dL (ref 3.5–4.7)
ALK PHOS: 60 IU/L (ref 39–117)
ALT: 11 IU/L (ref 0–32)
AST: 23 IU/L (ref 0–40)
BUN/Creatinine Ratio: 20 (ref 12–28)
BUN: 29 mg/dL — ABNORMAL HIGH (ref 8–27)
Bilirubin Total: 0.6 mg/dL (ref 0.0–1.2)
CO2: 27 mmol/L (ref 20–29)
CREATININE: 1.43 mg/dL — AB (ref 0.57–1.00)
Calcium: 9.9 mg/dL (ref 8.7–10.3)
Chloride: 99 mmol/L (ref 96–106)
GFR calc Af Amer: 40 mL/min/{1.73_m2} — ABNORMAL LOW (ref >59)
GFR calc non Af Amer: 34 mL/min/{1.73_m2} — ABNORMAL LOW (ref >59)
GLOBULIN, TOTAL: 2.8 g/dL (ref 1.5–4.5)
Glucose: 97 mg/dL (ref 65–99)
POTASSIUM: 4.2 mmol/L (ref 3.5–5.2)
SODIUM: 140 mmol/L (ref 134–144)
Total Protein: 7.5 g/dL (ref 6.0–8.5)

## 2017-02-06 LAB — LIPID PANEL
CHOLESTEROL TOTAL: 145 mg/dL (ref 100–199)
Chol/HDL Ratio: 2.2 ratio (ref 0.0–4.4)
HDL: 65 mg/dL (ref >39)
LDL CALC: 52 mg/dL (ref 0–99)
Triglycerides: 141 mg/dL (ref 0–149)
VLDL Cholesterol Cal: 28 mg/dL (ref 5–40)

## 2017-03-29 ENCOUNTER — Encounter: Payer: Self-pay | Admitting: Nurse Practitioner

## 2017-03-29 ENCOUNTER — Ambulatory Visit (INDEPENDENT_AMBULATORY_CARE_PROVIDER_SITE_OTHER): Payer: Medicare HMO | Admitting: Nurse Practitioner

## 2017-03-29 VITALS — BP 138/65 | HR 70 | Temp 98.0°F | Ht 63.0 in | Wt 162.0 lb

## 2017-03-29 DIAGNOSIS — J069 Acute upper respiratory infection, unspecified: Secondary | ICD-10-CM | POA: Diagnosis not present

## 2017-03-29 MED ORDER — BENZONATATE 100 MG PO CAPS: 100.0000 mg | ORAL_CAPSULE | Freq: Three times a day (TID) | ORAL | 0 refills | Status: DC | PRN

## 2017-03-29 MED ORDER — AZITHROMYCIN 250 MG PO TABS: ORAL_TABLET | ORAL | 0 refills | Status: DC

## 2017-03-29 NOTE — Progress Notes (Signed)
   Subjective:    Patient ID: Audrey Manning, female    DOB: February 15, 1933, 84 y.o.   MRN: [id number]  HPI Patient comes in today c/o cough. She has had it for about 2 weeks. Cough is productive- yellowish in color.    Review of Systems  Constitutional: Negative for chills and fever.  HENT: Positive for congestion. Negative for rhinorrhea.   Respiratory: Positive for cough. Negative for shortness of breath.   Cardiovascular: Negative.   Gastrointestinal: Negative.   Genitourinary: Negative.   Neurological: Negative.   Psychiatric/Behavioral: Negative.   All other systems reviewed and are negative.      Objective:   Physical Exam  Constitutional: She appears well-developed and well-nourished. No distress.  HENT:  Right Ear: Hearing, tympanic membrane, external ear and ear canal normal.  Left Ear: Hearing, tympanic membrane, external ear and ear canal normal.  Nose: Mucosal edema and rhinorrhea present. Right sinus exhibits no maxillary sinus tenderness and no frontal sinus tenderness. Left sinus exhibits no maxillary sinus tenderness and no frontal sinus tenderness.  Mouth/Throat: Uvula is midline, oropharynx is clear and moist and mucous membranes are normal.  Neck: Normal range of motion.  Cardiovascular: Normal rate and regular rhythm.  Pulmonary/Chest: Effort normal and breath sounds normal. She has no wheezes.  Deep wet cough   Abdominal: Soft. Bowel sounds are normal.  Lymphadenopathy:    She has no cervical adenopathy.  Skin: Skin is warm.  Psychiatric: She has a normal mood and affect. Her behavior is normal. Judgment and thought content normal.   BP 138/65   Pulse 70   Temp 98 F (36.7 C) (Oral)   Ht 5\' 3"  (1.6 m)   Wt 162 lb (73.5 kg)   SpO2 97%   BMI 28.70 kg/m         Assessment & Plan:   1. Upper respiratory infection with cough and congestion    1. Take meds as prescribed 2. Use a cool mist humidifier especially during the winter months and when  heat has been humid. 3. Use saline nose sprays frequently 4. Saline irrigations of the nose can be very helpful if done frequently.  * 4X daily for 1 week*  * Use of a nettie pot can be helpful with this. Follow directions with this* 5. Drink plenty of fluids 6. Keep thermostat turn down low 7.For any cough or congestion  Use plain Mucinex- regular strength or max strength is fine   * Children- consult with Pharmacist for dosing 8. For fever or aces or pains- take tylenol or ibuprofen appropriate for age and weight.  * for fevers greater than 101 orally you may alternate ibuprofen and tylenol every  3 hours.    Meds ordered this encounter  Medications  . benzonatate (TESSALON PERLES) 100 MG capsule    Sig: Take 1 capsule (100 mg total) by mouth 3 (three) times daily as needed for cough.    Dispense:  20 capsule    Refill:  0    Order Specific Question:   Supervising Provider    Answer:   VINCENT, CAROL L [[id number]]  . azithromycin (ZITHROMAX Z-PAK) 250 MG tablet    Sig: As directed    Dispense:  6 tablet    Refill:  0    Order Specific Question:   Supervising Provider    Answer:   Eustaquio Maize [[id number]]   [name] Done, FNP

## 2017-03-29 NOTE — Patient Instructions (Signed)

## 2017-04-30 ENCOUNTER — Ambulatory Visit (INDEPENDENT_AMBULATORY_CARE_PROVIDER_SITE_OTHER): Payer: Medicare HMO | Admitting: Nurse Practitioner

## 2017-04-30 ENCOUNTER — Encounter: Payer: Self-pay | Admitting: Nurse Practitioner

## 2017-04-30 VITALS — BP 143/67 | HR 74 | Temp 96.7°F | Ht 63.0 in | Wt 159.0 lb

## 2017-04-30 DIAGNOSIS — J40 Bronchitis, not specified as acute or chronic: Secondary | ICD-10-CM | POA: Diagnosis not present

## 2017-04-30 MED ORDER — METHYLPREDNISOLONE ACETATE 80 MG/ML IJ SUSP
80.0000 mg | Freq: Once | INTRAMUSCULAR | Status: AC
  Administered 2017-04-30: 80 mg via INTRAMUSCULAR

## 2017-04-30 NOTE — Patient Instructions (Signed)

## 2017-04-30 NOTE — Progress Notes (Signed)
   Subjective:    Patient ID: Audrey Manning, female    DOB: [birth year], 84 y.o.   MRN: [id number]  HPI  Patient come sin c/o cough and congestion . Sh ehas had for several weeks. She was seen on 03/29/17 and was given zpak and tessalon perles.   Review of Systems  Constitutional: Negative for chills and fever.  HENT: Negative for congestion, rhinorrhea, sinus pressure, sore throat and trouble swallowing.   Respiratory: Positive for cough (dry- nonproductive).   Cardiovascular: Negative.   Gastrointestinal: Negative.   Genitourinary: Negative.   Musculoskeletal: Negative.   Neurological: Negative for headaches.  Psychiatric/Behavioral: Negative.        Objective:   Physical Exam  Constitutional: She is oriented to person, place, and time. She appears well-developed and well-nourished. No distress.  HENT:  Right Ear: External ear normal.  Left Ear: External ear normal.  Nose: Nose normal.  Mouth/Throat: Oropharynx is clear and moist.  Cardiovascular: Normal rate and regular rhythm.  Pulmonary/Chest: Effort normal and breath sounds normal. No respiratory distress. She has no wheezes.  Neurological: She is alert and oriented to person, place, and time.  Skin: Skin is warm.  Psychiatric: She has a normal mood and affect. Her behavior is normal. Judgment and thought content normal.   BP (!) 143/67   Pulse 74   Temp (!) 96.7 F (35.9 C) (Oral)   Ht 5\' 3"  (1.6 m)   Wt 159 lb (72.1 kg)   BMI 28.17 kg/m        Assessment & Plan:  1. Bronchitis 1. Take meds as prescribed 2. Use a cool mist humidifier especially during the winter months and when heat has been humid. 3. Use saline nose sprays frequently 4. Saline irrigations of the nose can be very helpful if done frequently.  * 4X daily for 1 week*  * Use of a nettie pot can be helpful with this. Follow directions with this* 5. Drink plenty of fluids 6. Keep thermostat turn down low 7.For any cough or congestion  Use plain  Mucinex- regular strength or max strength is fine   * Children- consult with Pharmacist for dosing 8. For fever or aces or pains- take tylenol or ibuprofen appropriate for age and weight.  * for fevers greater than 101 orally you may alternate ibuprofen and tylenol every  3 hours.    - methylPREDNISolone acetate (DEPO-MEDROL) injection 80 mg  [name] Done, FNP

## 2017-05-07 ENCOUNTER — Encounter: Payer: Self-pay | Admitting: Nurse Practitioner

## 2017-05-07 ENCOUNTER — Ambulatory Visit (INDEPENDENT_AMBULATORY_CARE_PROVIDER_SITE_OTHER): Payer: Medicare HMO | Admitting: Nurse Practitioner

## 2017-05-07 VITALS — BP 140/62 | HR 54 | Temp 97.0°F | Ht 63.0 in | Wt 158.0 lb

## 2017-05-07 DIAGNOSIS — J309 Allergic rhinitis, unspecified: Secondary | ICD-10-CM

## 2017-05-07 DIAGNOSIS — M810 Age-related osteoporosis without current pathological fracture: Secondary | ICD-10-CM | POA: Diagnosis not present

## 2017-05-07 DIAGNOSIS — N183 Chronic kidney disease, stage 3 unspecified: Secondary | ICD-10-CM

## 2017-05-07 DIAGNOSIS — Z6828 Body mass index (BMI) 28.0-28.9, adult: Secondary | ICD-10-CM | POA: Diagnosis not present

## 2017-05-07 DIAGNOSIS — E782 Mixed hyperlipidemia: Secondary | ICD-10-CM

## 2017-05-07 DIAGNOSIS — I10 Essential (primary) hypertension: Secondary | ICD-10-CM | POA: Diagnosis not present

## 2017-05-07 DIAGNOSIS — E038 Other specified hypothyroidism: Secondary | ICD-10-CM | POA: Diagnosis not present

## 2017-05-07 NOTE — Patient Instructions (Signed)
Bone Health Bones protect organs, store calcium, and anchor muscles. Good health habits, such as eating nutritious foods and exercising regularly, are important for maintaining healthy bones. They can also help to prevent a condition that causes bones to lose density and become weak and brittle (osteoporosis). Why is bone mass important? Bone mass refers to the amount of bone tissue that you have. The higher your bone mass, the stronger your bones. An important step toward having healthy bones throughout life is to have strong and dense bones during childhood. A young adult who has a high bone mass is more likely to have a high bone mass later in life. Bone mass at its greatest it is called peak bone mass. A large decline in bone mass occurs in older adults. In women, it occurs about the time of menopause. During this time, it is important to practice good health habits, because if more bone is lost than what is replaced, the bones will become less healthy and more likely to break (fracture). If you find that you have a low bone mass, you may be able to prevent osteoporosis or further bone loss by changing your diet and lifestyle. How can I find out if my bone mass is low? Bone mass can be measured with an X-ray test that is called a bone mineral density (BMD) test. This test is recommended for all women who are age 65 or older. It may also be recommended for men who are age 70 or older, or for people who are more likely to develop osteoporosis due to:  Having bones that break easily.  Having a long-term disease that weakens bones, such as kidney disease or rheumatoid arthritis.  Having menopause earlier than normal.  Taking medicine that weakens bones, such as steroids, thyroid hormones, or hormone treatment for breast cancer or prostate cancer.  Smoking.  Drinking three or more alcoholic drinks each day.  What are the nutritional recommendations for healthy bones? To have healthy bones, you  need to get enough of the right minerals and vitamins. Most nutrition experts recommend getting these nutrients from the foods that you eat. Nutritional recommendations vary from person to person. Ask your health care provider what is healthy for you. Here are some general guidelines. Calcium Recommendations Calcium is the most important (essential) mineral for bone health. Most people can get enough calcium from their diet, but supplements may be recommended for people who are at risk for osteoporosis. Good sources of calcium include:  Dairy products, such as low-fat or nonfat milk, cheese, and yogurt.  Dark green leafy vegetables, such as bok choy and broccoli.  Calcium-fortified foods, such as orange juice, cereal, bread, soy beverages, and tofu products.  Nuts, such as almonds.  Follow these recommended amounts for daily calcium intake:  Children, age 1?3: 700 mg.  Children, age 4?8: 1,000 mg.  Children, age 9?13: 1,300 mg.  Teens, age 14?18: 1,300 mg.  Adults, age 19?50: 1,000 mg.  Adults, age 51?70: ? Men: 1,000 mg. ? Women: 1,200 mg.  Adults, age 71 or older: 1,200 mg.  Pregnant and breastfeeding females: ? Teens: 1,300 mg. ? Adults: 1,000 mg.  Vitamin D Recommendations Vitamin D is the most essential vitamin for bone health. It helps the body to absorb calcium. Sunlight stimulates the skin to make vitamin D, so be sure to get enough sunlight. If you live in a cold climate or you do not get outside often, your health care provider may recommend that you take vitamin   D supplements. Good sources of vitamin D in your diet include:  Egg yolks.  Saltwater fish.  Milk and cereal fortified with vitamin D.  Follow these recommended amounts for daily vitamin D intake:  Children and teens, age 1?18: 600 international units.  Adults, age 50 or younger: 400-800 international units.  Adults, age 51 or older: 800-1,000 international units.  Other Nutrients Other nutrients  for bone health include:  Phosphorus. This mineral is found in meat, poultry, dairy foods, nuts, and legumes. The recommended daily intake for adult men and adult women is 700 mg.  Magnesium. This mineral is found in seeds, nuts, dark green vegetables, and legumes. The recommended daily intake for adult men is 400?420 mg. For adult women, it is 310?320 mg.  Vitamin K. This vitamin is found in green leafy vegetables. The recommended daily intake is 120 mg for adult men and 90 mg for adult women.  What type of physical activity is best for building and maintaining healthy bones? Weight-bearing and strength-building activities are important for building and maintaining peak bone mass. Weight-bearing activities cause muscles and bones to work against gravity. Strength-building activities increases muscle strength that supports bones. Weight-bearing and muscle-building activities include:  Walking and hiking.  Jogging and running.  Dancing.  Gym exercises.  Lifting weights.  Tennis and racquetball.  Climbing stairs.  Aerobics.  Adults should get at least 30 minutes of moderate physical activity on most days. Children should get at least 60 minutes of moderate physical activity on most days. Ask your health care provide what type of exercise is best for you. Where can I find more information? For more information, check out the following websites:  National Osteoporosis Foundation: http://nof.org/learn/basics  National Institutes of Health: http://www.niams.nih.gov/Health_Info/Bone/Bone_Health/bone_health_for_life.asp  This information is not intended to replace advice given to you by your health care provider. Make sure you discuss any questions you have with your health care provider. Document Released: 05/02/2003 Document Revised: 08/30/2015 Document Reviewed: 02/14/2014 Elsevier Interactive Patient Education  2018 [employer]  

## 2017-05-07 NOTE — Progress Notes (Signed)
Subjective:    Patient ID: Audrey Manning, female    DOB: Dec 04, 1932, 84 y.o.   MRN: [id number]  HPI  Oleta Gunnoe is here today for follow up of chronic medical problem.  Outpatient Encounter Medications as of 05/07/2017  Medication Sig  . alendronate (FOSAMAX) 70 MG tablet Take 1 tablet (70 mg total) by mouth every 7 (seven) days. Take with a full glass of water on an empty stomach.  Marland Kitchen amLODipine (NORVASC) 5 MG tablet TAKE ONE (1) TABLET EACH DAY  . Calcium Carbonate-Vitamin D (CALCIUM 500 + D) 500-125 MG-UNIT TABS Take 500 mg by mouth daily.  . Cholecalciferol (VITAMIN D3) 1000 units CAPS Take 1 capsule by mouth daily.  . furosemide (LASIX) 20 MG tablet Take 1 tablet (20 mg total) by mouth daily.  Marland Kitchen levothyroxine (SYNTHROID, LEVOTHROID) 50 MCG tablet TAKE ONE TABLET BY MOUTH ONCE DAILY BEFORE BREAKFAST  . meclizine (ANTIVERT) 25 MG tablet Take 1 tablet (25 mg total) by mouth 3 (three) times daily as needed for dizziness.  . metoprolol tartrate (LOPRESSOR) 50 MG tablet Take 1 tablet (50 mg total) by mouth 2 (two) times daily.  . simvastatin (ZOCOR) 20 MG tablet Take 1 tablet (20 mg total) by mouth at bedtime.  . triamcinolone cream (KENALOG) 0.1 % APPLY TWICE DAILY     1. Essential hypertension  No c/o chest pain, sob or headaches. Does not check blood pressure at home. BP Readings from Last 3 Encounters:  04/30/17 (!) 143/67  03/29/17 138/65  02/05/17 (!) 141/66     2. Other specified hypothyroidism  Not having any problems that she is aware of  3. Age-related osteoporosis without current pathological fracture  Does very little weight bearing exercise. Had dexascan on 11/30/16. t . score -3.5. Has been on fosamax for >5 years.  4. CKD (chronic kidney disease) stage 3, GFR 30-59 ml/min (HCC)  Currently just watching labs  5. BMI 28.0-28.9,adult  No recent weight changes  6. Mixed hyperlipidemia  Does not watch diet  7. Allergic rhinitis, unspecified seasonality, unspecified  trigger  Says she is currently not having any problems    New complaints: Was seen on 04/30/17  And was dx with bronchitis- says she is dong much better  But still has slight cough  Social history: Lives with husband whom has a lot of health issues    Review of Systems  Constitutional: Negative for activity change and appetite change.  HENT: Negative.   Eyes: Negative for pain.  Respiratory: Positive for cough. Negative for shortness of breath.   Cardiovascular: Negative for chest pain, palpitations and leg swelling.  Gastrointestinal: Negative for abdominal pain.  Endocrine: Negative for polydipsia.  Genitourinary: Negative.   Skin: Negative for rash.  Neurological: Negative for dizziness, weakness and headaches.  Hematological: Does not bruise/bleed easily.  Psychiatric/Behavioral: Negative.   All other systems reviewed and are negative.      Objective:   Physical Exam  Constitutional: She is oriented to person, place, and time. She appears well-developed and well-nourished.  HENT:  Nose: Nose normal.  Mouth/Throat: Oropharynx is clear and moist.  Eyes: EOM are normal.  Neck: Trachea normal, normal range of motion and full passive range of motion without pain. Neck supple. No JVD present. Carotid bruit is not present. No thyromegaly present.  Cardiovascular: Normal rate, regular rhythm, normal heart sounds and intact distal pulses. Exam reveals no gallop and no friction rub.  No murmur heard. Pulmonary/Chest: Effort normal and breath sounds normal.  Abdominal: Soft. Bowel sounds are normal. She exhibits no distension and no mass. There is no tenderness.  Musculoskeletal: Normal range of motion. She exhibits edema (1+ pittng edema bil).  Lymphadenopathy:    She has no cervical adenopathy.  Neurological: She is alert and oriented to person, place, and time. She has normal reflexes.  Skin: Skin is warm and dry.  Psychiatric: She has a normal mood and affect. Her behavior is  normal. Judgment and thought content normal.   BP 140/62   Pulse (!) 54   Temp (!) 97 F (36.1 C) (Oral)   Ht 5\' 3"  (1.6 m)   Wt 158 lb (71.7 kg)   BMI 27.99 kg/m       Assessment & Plan:  1. Essential hypertension Low sodium diet  2. Other specified hypothyroidism No problems  3. Age-related osteoporosis without current pathological fracture Weight bearing exercises encouraged Going to switch to prolia  4. CKD (chronic kidney disease) stage 3, GFR 30-59 ml/min (HCC) Currently watching labs  5. BMI 28.0-28.9,adult Discussed diet and exercise for person with BMI >25 Will recheck weight in 3-6 months  6. Mixed hyperlipidemia Low fat diet  7. Allergic rhinitis, unspecified seasonality, unspecified trigger    Labs pending Health maintenance reviewed Diet and exercise encouraged Continue all meds Follow up  In 3 months   [place], FNP

## 2017-05-07 NOTE — Addendum Note (Signed)
Addended by: Rolena Infante on: 05/07/2017 12:28 PM   Modules accepted: Orders

## 2017-05-08 LAB — THYROID PANEL WITH TSH
Free Thyroxine Index: 2.4 (ref 1.2–4.9)
T3 UPTAKE RATIO: 27 % (ref 24–39)
T4 TOTAL: 8.8 ug/dL (ref 4.5–12.0)
TSH: 2.02 u[IU]/mL (ref 0.450–4.500)

## 2017-05-08 LAB — CMP14+EGFR
ALT: 10 IU/L (ref 0–32)
AST: 22 IU/L (ref 0–40)
Albumin/Globulin Ratio: 1.8 (ref 1.2–2.2)
Albumin: 4.8 g/dL — ABNORMAL HIGH (ref 3.5–4.7)
Alkaline Phosphatase: 63 IU/L (ref 39–117)
BUN/Creatinine Ratio: 18 (ref 12–28)
BUN: 22 mg/dL (ref 8–27)
Bilirubin Total: 0.5 mg/dL (ref 0.0–1.2)
CALCIUM: 10.1 mg/dL (ref 8.7–10.3)
CO2: 27 mmol/L (ref 20–29)
CREATININE: 1.24 mg/dL — AB (ref 0.57–1.00)
Chloride: 100 mmol/L (ref 96–106)
GFR calc Af Amer: 47 mL/min/{1.73_m2} — ABNORMAL LOW (ref >59)
GFR, EST NON AFRICAN AMERICAN: 41 mL/min/{1.73_m2} — AB (ref >59)
Globulin, Total: 2.6 g/dL (ref 1.5–4.5)
Glucose: 96 mg/dL (ref 65–99)
Potassium: 4.6 mmol/L (ref 3.5–5.2)
Sodium: 141 mmol/L (ref 134–144)
Total Protein: 7.4 g/dL (ref 6.0–8.5)

## 2017-05-08 LAB — LIPID PANEL
Chol/HDL Ratio: 2.1 ratio (ref 0.0–4.4)
Cholesterol, Total: 159 mg/dL (ref 100–199)
HDL: 76 mg/dL (ref >39)
LDL Calculated: 62 mg/dL (ref 0–99)
Triglycerides: 107 mg/dL (ref 0–149)
VLDL Cholesterol Cal: 21 mg/dL (ref 5–40)

## 2017-08-16 ENCOUNTER — Encounter: Payer: Self-pay | Admitting: Nurse Practitioner

## 2017-08-16 ENCOUNTER — Ambulatory Visit (INDEPENDENT_AMBULATORY_CARE_PROVIDER_SITE_OTHER): Payer: Medicare HMO | Admitting: Nurse Practitioner

## 2017-08-16 VITALS — BP 148/76 | HR 62 | Temp 97.3°F | Ht 63.0 in | Wt 164.0 lb

## 2017-08-16 DIAGNOSIS — Z6829 Body mass index (BMI) 29.0-29.9, adult: Secondary | ICD-10-CM | POA: Diagnosis not present

## 2017-08-16 DIAGNOSIS — M810 Age-related osteoporosis without current pathological fracture: Secondary | ICD-10-CM | POA: Diagnosis not present

## 2017-08-16 DIAGNOSIS — E038 Other specified hypothyroidism: Secondary | ICD-10-CM

## 2017-08-16 DIAGNOSIS — I10 Essential (primary) hypertension: Secondary | ICD-10-CM

## 2017-08-16 DIAGNOSIS — E782 Mixed hyperlipidemia: Secondary | ICD-10-CM

## 2017-08-16 DIAGNOSIS — N183 Chronic kidney disease, stage 3 unspecified: Secondary | ICD-10-CM

## 2017-08-16 MED ORDER — AMLODIPINE BESYLATE 10 MG PO TABS: 10.0000 mg | ORAL_TABLET | Freq: Every day | ORAL | 1 refills | Status: DC

## 2017-08-16 MED ORDER — ALENDRONATE SODIUM 70 MG PO TABS: 70.0000 mg | ORAL_TABLET | ORAL | 1 refills | Status: DC

## 2017-08-16 MED ORDER — SIMVASTATIN 20 MG PO TABS: 20.0000 mg | ORAL_TABLET | Freq: Every day | ORAL | 1 refills | Status: DC

## 2017-08-16 MED ORDER — METOPROLOL TARTRATE 50 MG PO TABS
50.0000 mg | ORAL_TABLET | Freq: Two times a day (BID) | ORAL | 1 refills | Status: DC
Start: 2017-08-16 — End: 2017-11-18

## 2017-08-16 MED ORDER — FUROSEMIDE 20 MG PO TABS: 20.0000 mg | ORAL_TABLET | Freq: Every day | ORAL | 1 refills | Status: DC

## 2017-08-16 MED ORDER — LEVOTHYROXINE SODIUM 50 MCG PO TABS: ORAL_TABLET | ORAL | 1 refills | Status: DC

## 2017-08-16 NOTE — Progress Notes (Signed)
Subjective:    Patient ID: Audrey Manning, female    DOB: [birth year], 84 y.o.   MRN: [id number]   Chief Complaint: Medical management of chronic issues  HPI:  1. Essential hypertension  No c/o chest pain , sob or headaches. Does ot check blood pressures at home. BP Readings from Last 3 Encounters:  08/16/17 (!) 154/72  05/07/17 140/62  04/30/17 (!) 143/67     2. Other specified hypothyroidism  No problems that she is aware of.  3. Age-related osteoporosis without current pathological fracture  Last BMD was 11/30/16 with tscore of -3.5. She has been on fosamax for ovr 5 years. Needs to try prolia- will order.  4. CKD (chronic kidney disease) stage 3, GFR 30-59 ml/min (HCC)  Last creatine was 1.24 with GFR of 41. We are currently just watching labs  5. Mixed hyperlipidemia  Does not watch diet  6. BMI 28.0-28.9,adult  Has gained 6lbs since last visit.    Outpatient Encounter Medications as of 08/16/2017  Medication Sig  . alendronate (FOSAMAX) 70 MG tablet Take 1 tablet (70 mg total) by mouth every 7 (seven) days. Take with a full glass of water on an empty stomach.  Marland Kitchen amLODipine (NORVASC) 5 MG tablet TAKE ONE (1) TABLET EACH DAY  . Calcium Carbonate-Vitamin D (CALCIUM 500 + D) 500-125 MG-UNIT TABS Take 500 mg by mouth daily.  . Cholecalciferol (VITAMIN D3) 1000 units CAPS Take 1 capsule by mouth daily.  . furosemide (LASIX) 20 MG tablet Take 1 tablet (20 mg total) by mouth daily.  Marland Kitchen levothyroxine (SYNTHROID, LEVOTHROID) 50 MCG tablet TAKE ONE TABLET BY MOUTH ONCE DAILY BEFORE BREAKFAST  . meclizine (ANTIVERT) 25 MG tablet Take 1 tablet (25 mg total) by mouth 3 (three) times daily as needed for dizziness.  . metoprolol tartrate (LOPRESSOR) 50 MG tablet Take 1 tablet (50 mg total) by mouth 2 (two) times daily.  . simvastatin (ZOCOR) 20 MG tablet Take 1 tablet (20 mg total) by mouth at bedtime.  . triamcinolone cream (KENALOG) 0.1 % APPLY TWICE DAILY      New  complaints: None today  Social history: Lives with husband and daughter. She stays busy waiting on both of them.  Review of Systems  Constitutional: Negative for activity change and appetite change.  HENT: Negative.   Eyes: Negative for pain.  Respiratory: Negative for shortness of breath.   Cardiovascular: Negative for chest pain, palpitations and leg swelling.  Gastrointestinal: Negative for abdominal pain.  Endocrine: Negative for polydipsia.  Genitourinary: Negative.   Skin: Negative for rash.  Neurological: Negative for dizziness, weakness and headaches.  Hematological: Does not bruise/bleed easily.  Psychiatric/Behavioral: Negative.   All other systems reviewed and are negative.      Objective:   Physical Exam  Constitutional: She is oriented to person, place, and time. She appears well-developed and well-nourished. No distress.  HENT:  Head: Normocephalic.  Nose: Nose normal.  Mouth/Throat: Oropharynx is clear and moist.  Eyes: Pupils are equal, round, and reactive to light. EOM are normal.  Neck: Normal range of motion. Neck supple. No JVD present. Carotid bruit is not present.  Cardiovascular: Normal rate, regular rhythm, normal heart sounds and intact distal pulses.  Pulmonary/Chest: Effort normal and breath sounds normal. No respiratory distress. She has no wheezes. She has no rales. She exhibits no tenderness.  Abdominal: Soft. Normal appearance, normal aorta and bowel sounds are normal. She exhibits no distension, no abdominal bruit, no pulsatile midline mass and no mass.  There is no splenomegaly or hepatomegaly. There is no tenderness.  Musculoskeletal: Normal range of motion. She exhibits no edema.  Lymphadenopathy:    She has no cervical adenopathy.  Neurological: She is alert and oriented to person, place, and time. She has normal reflexes.  Skin: Skin is warm and dry.  Psychiatric: She has a normal mood and affect. Her behavior is normal. Judgment and thought  content normal.  Nursing note and vitals reviewed.  BP (!) 154/72   Pulse 62   Temp (!) 97.3 F (36.3 C) (Oral)   Ht 5' 3"  (1.6 m)   Wt 164 lb (74.4 kg)   BMI 29.05 kg/m        Assessment & Plan:  Audrey Manning comes in today with chief complaint of No chief complaint on file.   Diagnosis and orders addressed:  1. Essential hypertension Low sodium diet Increased amlodipine from 76m daily to 172mdaily- patient was told to let me know if peripheral edema increased - metoprolol tartrate (LOPRESSOR) 50 MG tablet; Take 1 tablet (50 mg total) by mouth 2 (two) times daily.  Dispense: 180 tablet; Refill: 1 - furosemide (LASIX) 20 MG tablet; Take 1 tablet (20 mg total) by mouth daily.  Dispense: 90 tablet; Refill: 1 - CMP14+EGFR  2. Other specified hypothyroidism - levothyroxine (SYNTHROID, LEVOTHROID) 50 MCG tablet; TAKE ONE TABLET BY MOUTH ONCE DAILY BEFORE BREAKFAST  Dispense: 90 tablet; Refill: 1  3. Age-related osteoporosis without current pathological fracture Weight bearing exercises encouraged - alendronate (FOSAMAX) 70 MG tablet; Take 1 tablet (70 mg total) by mouth every 7 (seven) days. Take with a full glass of water on an empty stomach.  Dispense: 12 tablet; Refill: 1  4. CKD (chronic kidney disease) stage 3, GFR 30-59 ml/min (HCC) Labs pending  5. Mixed hyperlipidemia Low fat diet - simvastatin (ZOCOR) 20 MG tablet; Take 1 tablet (20 mg total) by mouth at bedtime.  Dispense: 90 tablet; Refill: 1 - Lipid panel  6. BMI 29.0-29.9,adult Discussed diet and exercise for person with BMI >25 Will recheck weight in 3-6 months    Labs pending Health Maintenance reviewed Diet and exercise encouraged  Follow up plan: 3 months   Mary-Margaret MaHassell DoneFNP

## 2017-08-16 NOTE — Patient Instructions (Signed)
Bone Health Bones protect organs, store calcium, and anchor muscles. Good health habits, such as eating nutritious foods and exercising regularly, are important for maintaining healthy bones. They can also help to prevent a condition that causes bones to lose density and become weak and brittle (osteoporosis). Why is bone mass important? Bone mass refers to the amount of bone tissue that you have. The higher your bone mass, the stronger your bones. An important step toward having healthy bones throughout life is to have strong and dense bones during childhood. A young adult who has a high bone mass is more likely to have a high bone mass later in life. Bone mass at its greatest it is called peak bone mass. A large decline in bone mass occurs in older adults. In women, it occurs about the time of menopause. During this time, it is important to practice good health habits, because if more bone is lost than what is replaced, the bones will become less healthy and more likely to break (fracture). If you find that you have a low bone mass, you may be able to prevent osteoporosis or further bone loss by changing your diet and lifestyle. How can I find out if my bone mass is low? Bone mass can be measured with an X-ray test that is called a bone mineral density (BMD) test. This test is recommended for all women who are age 65 or older. It may also be recommended for men who are age 70 or older, or for people who are more likely to develop osteoporosis due to:  Having bones that break easily.  Having a long-term disease that weakens bones, such as kidney disease or rheumatoid arthritis.  Having menopause earlier than normal.  Taking medicine that weakens bones, such as steroids, thyroid hormones, or hormone treatment for breast cancer or prostate cancer.  Smoking.  Drinking three or more alcoholic drinks each day.  What are the nutritional recommendations for healthy bones? To have healthy bones, you  need to get enough of the right minerals and vitamins. Most nutrition experts recommend getting these nutrients from the foods that you eat. Nutritional recommendations vary from person to person. Ask your health care provider what is healthy for you. Here are some general guidelines. Calcium Recommendations Calcium is the most important (essential) mineral for bone health. Most people can get enough calcium from their diet, but supplements may be recommended for people who are at risk for osteoporosis. Good sources of calcium include:  Dairy products, such as low-fat or nonfat milk, cheese, and yogurt.  Dark green leafy vegetables, such as bok choy and broccoli.  Calcium-fortified foods, such as orange juice, cereal, bread, soy beverages, and tofu products.  Nuts, such as almonds.  Follow these recommended amounts for daily calcium intake:  Children, age 1?3: 700 mg.  Children, age 4?8: 1,000 mg.  Children, age 9?13: 1,300 mg.  Teens, age 14?18: 1,300 mg.  Adults, age 19?50: 1,000 mg.  Adults, age 51?70: ? Men: 1,000 mg. ? Women: 1,200 mg.  Adults, age 71 or older: 1,200 mg.  Pregnant and breastfeeding females: ? Teens: 1,300 mg. ? Adults: 1,000 mg.  Vitamin D Recommendations Vitamin D is the most essential vitamin for bone health. It helps the body to absorb calcium. Sunlight stimulates the skin to make vitamin D, so be sure to get enough sunlight. If you live in a cold climate or you do not get outside often, your health care provider may recommend that you take vitamin   D supplements. Good sources of vitamin D in your diet include:  Egg yolks.  Saltwater fish.  Milk and cereal fortified with vitamin D.  Follow these recommended amounts for daily vitamin D intake:  Children and teens, age 1?18: 600 international units.  Adults, age 50 or younger: 400-800 international units.  Adults, age 51 or older: 800-1,000 international units.  Other Nutrients Other nutrients  for bone health include:  Phosphorus. This mineral is found in meat, poultry, dairy foods, nuts, and legumes. The recommended daily intake for adult men and adult women is 700 mg.  Magnesium. This mineral is found in seeds, nuts, dark green vegetables, and legumes. The recommended daily intake for adult men is 400?420 mg. For adult women, it is 310?320 mg.  Vitamin K. This vitamin is found in green leafy vegetables. The recommended daily intake is 120 mg for adult men and 90 mg for adult women.  What type of physical activity is best for building and maintaining healthy bones? Weight-bearing and strength-building activities are important for building and maintaining peak bone mass. Weight-bearing activities cause muscles and bones to work against gravity. Strength-building activities increases muscle strength that supports bones. Weight-bearing and muscle-building activities include:  Walking and hiking.  Jogging and running.  Dancing.  Gym exercises.  Lifting weights.  Tennis and racquetball.  Climbing stairs.  Aerobics.  Adults should get at least 30 minutes of moderate physical activity on most days. Children should get at least 60 minutes of moderate physical activity on most days. Ask your health care provide what type of exercise is best for you. Where can I find more information? For more information, check out the following websites:  National Osteoporosis Foundation: http://nof.org/learn/basics  National Institutes of Health: http://www.niams.nih.gov/Health_Info/Bone/Bone_Health/bone_health_for_life.asp  This information is not intended to replace advice given to you by your health care provider. Make sure you discuss any questions you have with your health care provider. Document Released: 05/02/2003 Document Revised: 08/30/2015 Document Reviewed: 02/14/2014 Elsevier Interactive Patient Education  2018 [employer]  

## 2017-08-17 LAB — CMP14+EGFR
ALBUMIN: 4.5 g/dL (ref 3.5–4.7)
ALK PHOS: 63 IU/L (ref 39–117)
ALT: 10 IU/L (ref 0–32)
AST: 24 IU/L (ref 0–40)
Albumin/Globulin Ratio: 1.6 (ref 1.2–2.2)
BILIRUBIN TOTAL: 0.7 mg/dL (ref 0.0–1.2)
BUN / CREAT RATIO: 15 (ref 12–28)
BUN: 21 mg/dL (ref 8–27)
CHLORIDE: 102 mmol/L (ref 96–106)
CO2: 25 mmol/L (ref 20–29)
Calcium: 9.7 mg/dL (ref 8.7–10.3)
Creatinine, Ser: 1.39 mg/dL — ABNORMAL HIGH (ref 0.57–1.00)
GFR calc Af Amer: 41 mL/min/{1.73_m2} — ABNORMAL LOW (ref >59)
GFR calc non Af Amer: 35 mL/min/{1.73_m2} — ABNORMAL LOW (ref >59)
GLOBULIN, TOTAL: 2.8 g/dL (ref 1.5–4.5)
GLUCOSE: 101 mg/dL — AB (ref 65–99)
Potassium: 4.5 mmol/L (ref 3.5–5.2)
SODIUM: 142 mmol/L (ref 134–144)
Total Protein: 7.3 g/dL (ref 6.0–8.5)

## 2017-08-17 LAB — LIPID PANEL
CHOL/HDL RATIO: 2.1 ratio (ref 0.0–4.4)
Cholesterol, Total: 152 mg/dL (ref 100–199)
HDL: 72 mg/dL (ref >39)
LDL Calculated: 60 mg/dL (ref 0–99)
Triglycerides: 98 mg/dL (ref 0–149)
VLDL Cholesterol Cal: 20 mg/dL (ref 5–40)

## 2017-08-24 ENCOUNTER — Encounter: Payer: Self-pay | Admitting: *Deleted

## 2017-09-06 ENCOUNTER — Telehealth: Payer: Self-pay | Admitting: *Deleted

## 2017-09-06 DIAGNOSIS — E039 Hypothyroidism, unspecified: Secondary | ICD-10-CM | POA: Diagnosis not present

## 2017-09-06 DIAGNOSIS — N189 Chronic kidney disease, unspecified: Secondary | ICD-10-CM | POA: Diagnosis not present

## 2017-09-06 DIAGNOSIS — Z85828 Personal history of other malignant neoplasm of skin: Secondary | ICD-10-CM | POA: Diagnosis not present

## 2017-09-06 DIAGNOSIS — Z7983 Long term (current) use of bisphosphonates: Secondary | ICD-10-CM | POA: Diagnosis not present

## 2017-09-06 DIAGNOSIS — M810 Age-related osteoporosis without current pathological fracture: Secondary | ICD-10-CM | POA: Diagnosis not present

## 2017-09-06 DIAGNOSIS — L309 Dermatitis, unspecified: Secondary | ICD-10-CM | POA: Diagnosis not present

## 2017-09-06 DIAGNOSIS — E785 Hyperlipidemia, unspecified: Secondary | ICD-10-CM | POA: Diagnosis not present

## 2017-09-06 DIAGNOSIS — I129 Hypertensive chronic kidney disease with stage 1 through stage 4 chronic kidney disease, or unspecified chronic kidney disease: Secondary | ICD-10-CM | POA: Diagnosis not present

## 2017-09-06 DIAGNOSIS — R600 Localized edema: Secondary | ICD-10-CM | POA: Diagnosis not present

## 2017-09-06 NOTE — Telephone Encounter (Signed)
PCP would like to start patient on Prolia due to worsening bone density scan results.   Summary of benefits received and Prior Auth from [employer] approved for dates 09/10/17 to 09/11/18.  Patient would owe 20% which is about $240.

## 2017-09-13 ENCOUNTER — Telehealth: Payer: Self-pay | Admitting: Nurse Practitioner

## 2017-09-13 NOTE — Telephone Encounter (Signed)
Patient states she is hurting all across the lower part of her back and into the lower rib area.

## 2017-09-13 NOTE — Telephone Encounter (Signed)
Pt ws asking if the increased Amlodipine could be causing her back pain. Offered an appt today, pt said to Just ask MMM and call her back

## 2017-09-13 NOTE — Telephone Encounter (Signed)
Amlodipine should noit cause back pain. Is it lower back?

## 2017-09-16 NOTE — Telephone Encounter (Signed)
Will need to be seen

## 2017-09-20 ENCOUNTER — Encounter: Payer: Self-pay | Admitting: Nurse Practitioner

## 2017-09-20 ENCOUNTER — Ambulatory Visit (INDEPENDENT_AMBULATORY_CARE_PROVIDER_SITE_OTHER): Payer: Medicare HMO | Admitting: Nurse Practitioner

## 2017-09-20 VITALS — BP 140/65 | HR 73 | Temp 97.8°F | Ht 63.0 in | Wt 162.0 lb

## 2017-09-20 DIAGNOSIS — M545 Low back pain, unspecified: Secondary | ICD-10-CM

## 2017-09-20 DIAGNOSIS — N3000 Acute cystitis without hematuria: Secondary | ICD-10-CM | POA: Diagnosis not present

## 2017-09-20 LAB — URINALYSIS, COMPLETE
BILIRUBIN UA: NEGATIVE
Glucose, UA: NEGATIVE
NITRITE UA: NEGATIVE
Specific Gravity, UA: 1.015 (ref 1.005–1.030)
Urobilinogen, Ur: 0.2 mg/dL (ref 0.2–1.0)
pH, UA: 5.5 (ref 5.0–7.5)

## 2017-09-20 LAB — MICROSCOPIC EXAMINATION

## 2017-09-20 MED ORDER — METHYLPREDNISOLONE ACETATE 80 MG/ML IJ SUSP
80.0000 mg | Freq: Once | INTRAMUSCULAR | Status: AC
  Administered 2017-09-20: 80 mg via INTRAMUSCULAR

## 2017-09-20 MED ORDER — CIPROFLOXACIN HCL 500 MG PO TABS
500.0000 mg | ORAL_TABLET | Freq: Two times a day (BID) | ORAL | 0 refills | Status: DC
Start: 2017-09-20 — End: 2017-10-05

## 2017-09-20 NOTE — Patient Instructions (Signed)

## 2017-09-20 NOTE — Progress Notes (Signed)
   Subjective:    Patient ID: Audrey Manning, female    DOB: 1932-03-16, 84 y.o.   MRN: [id number]   Chief Complaint: Back Pain (mid back and low back)   HPI Patient is brought in today by her daughter, Audrey Manning. She is c/o of low back pain. Pain started about 2 weeks ago. She was digging grass out from around flowers and in her garden, and her back started hurting that evening. Rates pain 2-3/10 currently. Going from siting to standing increases pain for a little bit but standing and walking does ot increase pain. Pain does not radiate. denies dysuria , frequency and urgency. But has had to het up in middle on night to void.   Review of Systems  Constitutional: Negative.   HENT: Negative.   Respiratory: Negative.   Cardiovascular: Negative.   Genitourinary: Negative for dysuria, frequency, pelvic pain and urgency.  Neurological: Negative.   Psychiatric/Behavioral: Negative.   All other systems reviewed and are negative.      Objective:   Physical Exam  Constitutional: She is oriented to person, place, and time. She appears well-developed and well-nourished. No distress.  Cardiovascular: Normal rate.  Pulmonary/Chest: Effort normal.  Neurological: She is alert and oriented to person, place, and time.  Skin: Skin is warm.  Psychiatric: She has a normal mood and affect. Her behavior is normal. Judgment and thought content normal.   BP 140/65   Pulse 73   Temp 97.8 F (36.6 C) (Oral)   Ht 5\' 3"  (1.6 m)   Wt 162 lb (73.5 kg)   BMI 28.70 kg/m       Assessment & Plan:  Audrey Manning in today with chief complaint of Back Pain (mid back and low back)   1. Acute midline low back pain without sciatica Moist heat  rest - Urinalysis, Complete - methylPREDNISolone acetate (DEPO-MEDROL) injection 80 mg  2. Acute cystitis without hematuria Take medication as prescribe Cotton underwear Take shower not bath Cranberry juice, yogurt Force fluids AZO over the counter X2  days Culture pending RTO prn  - Urine Culture - ciprofloxacin (CIPRO) 500 MG tablet; Take 1 tablet (500 mg total) by mouth 2 (two) times daily.  Dispense: 10 tablet; Refill: 0  [name] Done, FNP

## 2017-09-23 NOTE — Telephone Encounter (Signed)
Attempt to call pt without return call in over 3 days, will close encounter.

## 2017-09-24 LAB — URINE CULTURE

## 2017-10-05 ENCOUNTER — Ambulatory Visit (INDEPENDENT_AMBULATORY_CARE_PROVIDER_SITE_OTHER): Payer: Medicare HMO | Admitting: Family

## 2017-10-05 ENCOUNTER — Encounter: Payer: Self-pay | Admitting: Family

## 2017-10-05 VITALS — BP 135/71 | HR 68 | Temp 97.4°F | Ht 63.0 in | Wt 167.0 lb

## 2017-10-05 DIAGNOSIS — N3001 Acute cystitis with hematuria: Secondary | ICD-10-CM

## 2017-10-05 DIAGNOSIS — M545 Low back pain, unspecified: Secondary | ICD-10-CM

## 2017-10-05 DIAGNOSIS — Z87440 Personal history of urinary (tract) infections: Secondary | ICD-10-CM | POA: Diagnosis not present

## 2017-10-05 DIAGNOSIS — R6889 Other general symptoms and signs: Secondary | ICD-10-CM | POA: Diagnosis not present

## 2017-10-05 MED ORDER — SULFAMETHOXAZOLE-TRIMETHOPRIM 800-160 MG PO TABS: 1.0000 | ORAL_TABLET | Freq: Two times a day (BID) | ORAL | 0 refills | Status: DC

## 2017-10-05 NOTE — Patient Instructions (Signed)

## 2017-10-05 NOTE — Progress Notes (Signed)
   Subjective:    Patient ID: Audrey Manning, female    DOB: December 16, 1932, 84 y.o.   MRN: [id number]  Chief Complaint  Patient presents with  . Back Pain   Pt presents to the office today with recurrent back pain.  Back Pain  This is a new problem. The current episode started 1 to 4 weeks ago. The problem occurs intermittently. The problem has been gradually improving since onset. The pain is present in the lumbar spine (Bilateral flank). The quality of the pain is described as aching. The pain does not radiate. The pain is at a severity of 1/10. The pain is mild. Associated symptoms include weakness. Pertinent negatives include no bladder incontinence, bowel incontinence, leg pain, numbness or tingling. She has tried muscle relaxant (cipro) for the symptoms. The treatment provided moderate relief.      Review of Systems  Gastrointestinal: Negative for bowel incontinence.  Genitourinary: Negative for bladder incontinence.  Musculoskeletal: Positive for back pain.  Neurological: Positive for weakness. Negative for tingling and numbness.  All other systems reviewed and are negative.      Objective:   Physical Exam  Constitutional: She is oriented to person, place, and time. She appears well-developed and well-nourished. No distress.  HENT:  Head: Normocephalic and atraumatic.  Right Ear: External ear normal.  Left Ear: External ear normal.  Mouth/Throat: Oropharynx is clear and moist.  Eyes: Pupils are equal, round, and reactive to light.  Neck: Normal range of motion. Neck supple. No thyromegaly present.  Cardiovascular: Normal rate, regular rhythm, normal heart sounds and intact distal pulses.  No murmur heard. Pulmonary/Chest: Effort normal and breath sounds normal. No respiratory distress. She has no wheezes.  Abdominal: Soft. Bowel sounds are normal. She exhibits no distension. There is no tenderness.  Musculoskeletal: Normal range of motion. She exhibits no edema or tenderness.   Negative flank pain, full ROM of lower back   Neurological: She is alert and oriented to person, place, and time. She has normal reflexes. No cranial nerve deficit.  Skin: Skin is warm and dry.  Psychiatric: She has a normal mood and affect. Her behavior is normal. Judgment and thought content normal.  Vitals reviewed.     BP 135/71   Pulse 68   Temp (!) 97.4 F (36.3 C) (Oral)   Ht 5\' 3"  (1.6 m)   Wt 167 lb (75.8 kg)   BMI 29.58 kg/m      Assessment & Plan:  Audrey Manning comes in today with chief complaint of Back Pain   Diagnosis and orders addressed:  1. Acute low back pain without sciatica, unspecified back pain laterality - Urinalysis, Complete - Urine Culture  2. Hx: UTI (urinary tract infection) - Urine Culture  3. Acute cystitis with hematuria Force fluids RTO prn Culture pending - sulfamethoxazole-trimethoprim (BACTRIM DS) 800-160 MG tablet; Take 1 tablet by mouth 2 (two) times daily.  Dispense: 14 tablet; Refill: 0   Evelina Dun, FNP

## 2017-10-06 LAB — MICROSCOPIC EXAMINATION: RENAL EPITHEL UA: NONE SEEN /HPF

## 2017-10-06 LAB — URINALYSIS, COMPLETE
BILIRUBIN UA: NEGATIVE
GLUCOSE, UA: NEGATIVE
Ketones, UA: NEGATIVE
NITRITE UA: NEGATIVE
Protein, UA: NEGATIVE
Specific Gravity, UA: 1.01 (ref 1.005–1.030)
Urobilinogen, Ur: 0.2 mg/dL (ref 0.2–1.0)
pH, UA: 5 (ref 5.0–7.5)

## 2017-10-06 LAB — URINE CULTURE

## 2017-11-18 ENCOUNTER — Ambulatory Visit (INDEPENDENT_AMBULATORY_CARE_PROVIDER_SITE_OTHER): Payer: Medicare HMO | Admitting: Nurse Practitioner

## 2017-11-18 ENCOUNTER — Encounter: Payer: Self-pay | Admitting: Nurse Practitioner

## 2017-11-18 VITALS — BP 116/58 | HR 68 | Temp 97.9°F | Ht 63.0 in | Wt 161.2 lb

## 2017-11-18 DIAGNOSIS — N183 Chronic kidney disease, stage 3 unspecified: Secondary | ICD-10-CM

## 2017-11-18 DIAGNOSIS — R69 Illness, unspecified: Secondary | ICD-10-CM | POA: Diagnosis not present

## 2017-11-18 DIAGNOSIS — E782 Mixed hyperlipidemia: Secondary | ICD-10-CM

## 2017-11-18 DIAGNOSIS — I10 Essential (primary) hypertension: Secondary | ICD-10-CM

## 2017-11-18 DIAGNOSIS — M810 Age-related osteoporosis without current pathological fracture: Secondary | ICD-10-CM

## 2017-11-18 DIAGNOSIS — Z23 Encounter for immunization: Secondary | ICD-10-CM

## 2017-11-18 DIAGNOSIS — E038 Other specified hypothyroidism: Secondary | ICD-10-CM | POA: Diagnosis not present

## 2017-11-18 DIAGNOSIS — Z6828 Body mass index (BMI) 28.0-28.9, adult: Secondary | ICD-10-CM | POA: Diagnosis not present

## 2017-11-18 DIAGNOSIS — F411 Generalized anxiety disorder: Secondary | ICD-10-CM

## 2017-11-18 MED ORDER — METOPROLOL TARTRATE 50 MG PO TABS: 50.0000 mg | ORAL_TABLET | Freq: Two times a day (BID) | ORAL | 1 refills | Status: DC

## 2017-11-18 MED ORDER — CITALOPRAM HYDROBROMIDE 20 MG PO TABS: 20.0000 mg | ORAL_TABLET | Freq: Every day | ORAL | 5 refills | Status: DC

## 2017-11-18 MED ORDER — FUROSEMIDE 20 MG PO TABS: 20.0000 mg | ORAL_TABLET | Freq: Every day | ORAL | 1 refills | Status: DC

## 2017-11-18 MED ORDER — AMLODIPINE BESYLATE 10 MG PO TABS: 10.0000 mg | ORAL_TABLET | Freq: Every day | ORAL | 1 refills | Status: DC

## 2017-11-18 MED ORDER — ALENDRONATE SODIUM 70 MG PO TABS: 70.0000 mg | ORAL_TABLET | ORAL | 1 refills | Status: DC

## 2017-11-18 MED ORDER — CALCIUM CARBONATE-VITAMIN D 500-125 MG-UNIT PO TABS: 500.0000 mg | ORAL_TABLET | Freq: Every day | ORAL | 0 refills | Status: DC

## 2017-11-18 MED ORDER — VITAMIN D3 25 MCG (1000 UT) PO CAPS: 1.0000 | ORAL_CAPSULE | Freq: Every day | ORAL | 1 refills | Status: DC

## 2017-11-18 MED ORDER — SIMVASTATIN 20 MG PO TABS: 20.0000 mg | ORAL_TABLET | Freq: Every day | ORAL | 1 refills | Status: DC

## 2017-11-18 MED ORDER — LEVOTHYROXINE SODIUM 50 MCG PO TABS: ORAL_TABLET | ORAL | 1 refills | Status: DC

## 2017-11-18 NOTE — Progress Notes (Signed)
Subjective:    Patient ID: Audrey Manning, female    DOB: 03-02-1932, 84 y.o.   MRN: [id number]   Chief Complaint: Medical management of chronic issues  HPI:  1. Essential hypertension  -does check BP at home, average 110s/60s -no C/O of CP/SOB/HA -does not watch salt intake  BP Readings from Last 3 Encounters:  11/18/17 (!) 116/58  10/05/17 135/71  09/20/17 140/65    2. Other specified hypothyroidism  -no issues she is aware of   3. Age-related osteoporosis without current pathological fracture  -last Dexascan on 11/30/16 tscore-->-3.5 -on Fosamax, VitD+calcium suppl. -does no weight-bearing exercises  4. CKD (chronic kidney disease) stage 3, GFR 30-59 ml/min (HCC)  -last Creat 1.39, GFR 41, BUN 21 -trending labs  5. Mixed hyperlipidemia  -does not watch diet  6. BMI 28.0-28.9,adult  -does not exercise    Outpatient Encounter Medications as of 11/18/2017  Medication Sig  . alendronate (FOSAMAX) 70 MG tablet Take 1 tablet (70 mg total) by mouth every 7 (seven) days. Take with a full glass of water on an empty stomach.  Marland Kitchen amLODipine (NORVASC) 10 MG tablet Take 1 tablet (10 mg total) by mouth daily.  . Calcium Carbonate-Vitamin D (CALCIUM 500 + D) 500-125 MG-UNIT TABS Take 500 mg by mouth daily.  . Cholecalciferol (VITAMIN D3) 1000 units CAPS Take 1 capsule by mouth daily.  . furosemide (LASIX) 20 MG tablet Take 1 tablet (20 mg total) by mouth daily.  Marland Kitchen levothyroxine (SYNTHROID, LEVOTHROID) 50 MCG tablet TAKE ONE TABLET BY MOUTH ONCE DAILY BEFORE BREAKFAST  . meclizine (ANTIVERT) 25 MG tablet Take 1 tablet (25 mg total) by mouth 3 (three) times daily as needed for dizziness. (Patient not taking: Reported on 10/05/2017)  . metoprolol tartrate (LOPRESSOR) 50 MG tablet Take 1 tablet (50 mg total) by mouth 2 (two) times daily.  . simvastatin (ZOCOR) 20 MG tablet Take 1 tablet (20 mg total) by mouth at bedtime.  . sulfamethoxazole-trimethoprim (BACTRIM DS) 800-160 MG tablet Take  1 tablet by mouth 2 (two) times daily.  Marland Kitchen triamcinolone cream (KENALOG) 0.1 % APPLY TWICE DAILY   No facility-administered encounter medications on file as of 11/18/2017.       New complaints: Gets really nervous sometimes.  Started about 3-4 weeks, gets shaky, no palpitations, but jittery feeling. Has not taken anything for it, has stayed the same, happens about once a day.  Interfering with life, sleeping & appetite OK.     GAD 7 : Generalized Anxiety Score 11/18/2017  Nervous, Anxious, on Edge 3  Control/stop worrying 0  Worry too much - different things 0  Trouble relaxing 0  Restless 3  Easily annoyed or irritable 0  Afraid - awful might happen 0  Total GAD 7 Score 6  Anxiety Difficulty Not difficult at all   Social history: Lives with husband and daughter   Review of Systems  Constitutional: Negative for activity change, appetite change, fatigue and fever.  HENT: Negative for congestion, ear pain, rhinorrhea, sinus pressure, sinus pain and sore throat.   Eyes: Negative for pain, redness and visual disturbance.  Respiratory: Negative for cough, chest tightness, shortness of breath and wheezing.   Cardiovascular: Negative for chest pain, palpitations and leg swelling.  Gastrointestinal: Negative for abdominal pain, constipation, diarrhea, nausea and vomiting.  Endocrine: Negative for cold intolerance, heat intolerance, polydipsia, polyphagia and polyuria.  Genitourinary: Negative for difficulty urinating, dysuria and urgency.  Musculoskeletal: Negative for arthralgias, gait problem, joint swelling and myalgias.  Skin: Negative for rash and wound.  Allergic/Immunologic: Negative for immunocompromised state.  Neurological: Negative for dizziness, tremors, weakness and numbness.  Hematological: Does not bruise/bleed easily.  Psychiatric/Behavioral: Negative for behavioral problems, confusion, decreased concentration, sleep disturbance and suicidal ideas. The patient is  nervous/anxious.        Objective:   Physical Exam  Constitutional: She is oriented to person, place, and time. She appears well-developed and well-nourished.  HENT:  Head: Normocephalic and atraumatic.  Right Ear: External ear normal.  Left Ear: External ear normal.  Nose: Nose normal.  Mouth/Throat: Oropharynx is clear and moist. No oropharyngeal exudate.  Eyes: Pupils are equal, round, and reactive to light. Conjunctivae and EOM are normal.  Neck: Normal range of motion. Neck supple. No thyromegaly present.  Cardiovascular: Normal rate, regular rhythm, normal heart sounds and intact distal pulses.  Pulmonary/Chest: Effort normal and breath sounds normal.  Abdominal: Soft. Bowel sounds are normal.  Musculoskeletal: Normal range of motion.       Right ankle: She exhibits swelling.  Neurological: She is alert and oriented to person, place, and time. She displays normal reflexes. No cranial nerve deficit or sensory deficit.  Skin: Skin is warm and dry.  Psychiatric: She has a normal mood and affect. Her behavior is normal. Judgment and thought content normal.  Nursing note and vitals reviewed.  BP (!) 116/58   Pulse 68   Temp 97.9 F (36.6 C) (Oral)   Ht 5' 3"  (1.6 m)   Wt 161 lb 4 oz (73.1 kg)   BMI 28.56 kg/m      Assessment & Plan:  [name] comes in today with chief complaint of Medical Management of Chronic Issues   Diagnosis and orders addressed:  1. Essential hypertension - CMP14+EGFR - metoprolol tartrate (LOPRESSOR) 50 MG tablet; Take 1 tablet (50 mg total) by mouth 2 (two) times daily.  Dispense: 180 tablet; Refill: 1 - furosemide (LASIX) 20 MG tablet; Take 1 tablet (20 mg total) by mouth daily.  Dispense: 90 tablet; Refill: 1 -low salt diet -compression stockings if legs swell  2. Other specified hypothyroidism - Thyroid Panel With TSH - levothyroxine (SYNTHROID, LEVOTHROID) 50 MCG tablet; TAKE ONE TABLET BY MOUTH ONCE DAILY BEFORE BREAKFAST  Dispense:  90 tablet; Refill: 1  3. Age-related osteoporosis without current pathological fracture - alendronate (FOSAMAX) 70 MG tablet; Take 1 tablet (70 mg total) by mouth every 7 (seven) days. Take with a full glass of water on an empty stomach.  Dispense: 12 tablet; Refill: 1 -discussed Provia, patient will think about it but declines at this time 4. CKD (chronic kidney disease) stage 3, GFR 30-59 ml/min (HCC) -CMET+GFR  5. Mixed hyperlipidemia - Lipid panel - simvastatin (ZOCOR) 20 MG tablet; Take 1 tablet (20 mg total) by mouth at bedtime.  Dispense: 90 tablet; Refill: 1 -low fat diet 6. BMI 28.0-28.9,adult -diet and exercise counseling  7. Generalized anxiety disorder -stress managment - citalopram (CELEXA) 20 MG tablet; Take 1 tablet (20 mg total) by mouth daily.  Dispense: 30 tablet; Refill: 5 -will also check TSH  Labs pending Health Maintenance reviewed Diet and exercise encouraged  Follow up plan: 3 months   [name] Done, FNP

## 2017-11-18 NOTE — Patient Instructions (Addendum)
Fall Prevention in the Home Falls can cause injuries. They can happen to people of all ages. There are many things you can do to make your home safe and to help prevent falls. What can I do on the outside of my home?  Regularly fix the edges of walkways and driveways and fix any cracks.  Remove anything that might make you trip as you walk through a door, such as a raised step or threshold.  Trim any bushes or trees on the path to your home.  Use bright outdoor lighting.  Clear any walking paths of anything that might make someone trip, such as rocks or tools.  Regularly check to see if handrails are loose or broken. Make sure that both sides of any steps have handrails.  Any raised decks and porches should have guardrails on the edges.  Have any leaves, snow, or ice cleared regularly.  Use sand or salt on walking paths during winter.  Clean up any spills in your garage right away. This includes oil or grease spills. What can I do in the bathroom?  Use night lights.  Install grab bars by the toilet and in the tub and shower. Do not use towel bars as grab bars.  Use non-skid mats or decals in the tub or shower.  If you need to sit down in the shower, use a plastic, non-slip stool.  Keep the floor dry. Clean up any water that spills on the floor as soon as it happens.  Remove soap buildup in the tub or shower regularly.  Attach bath mats securely with double-sided non-slip rug tape.  Do not have throw rugs and other things on the floor that can make you trip. What can I do in the bedroom?  Use night lights.  Make sure that you have a light by your bed that is easy to reach.  Do not use any sheets or blankets that are too big for your bed. They should not hang down onto the floor.  Have a firm chair that has side arms. You can use this for support while you get dressed.  Do not have throw rugs and other things on the floor that can make you trip. What can I do in the  kitchen?  Clean up any spills right away.  Avoid walking on wet floors.  Keep items that you use a lot in easy-to-reach places.  If you need to reach something above you, use a strong step stool that has a grab bar.  Keep electrical cords out of the way.  Do not use floor polish or wax that makes floors slippery. If you must use wax, use non-skid floor wax.  Do not have throw rugs and other things on the floor that can make you trip. What can I do with my stairs?  Do not leave any items on the stairs.  Make sure that there are handrails on both sides of the stairs and use them. Fix handrails that are broken or loose. Make sure that handrails are as long as the stairways.  Check any carpeting to make sure that it is firmly attached to the stairs. Fix any carpet that is loose or worn.  Avoid having throw rugs at the top or bottom of the stairs. If you do have throw rugs, attach them to the floor with carpet tape.  Make sure that you have a light switch at the top of the stairs and the bottom of the stairs. If you do   not have them, ask someone to add them for you. What else can I do to help prevent falls?  Wear shoes that: ? Do not have high heels. ? Have rubber bottoms. ? Are comfortable and fit you well. ? Are closed at the toe. Do not wear sandals.  If you use a stepladder: ? Make sure that it is fully opened. Do not climb a closed stepladder. ? Make sure that both sides of the stepladder are locked into place. ? Ask someone to hold it for you, if possible.  Clearly mark and make sure that you can see: ? Any grab bars or handrails. ? First and last steps. ? Where the edge of each step is.  Use tools that help you move around (mobility aids) if they are needed. These include: ? Canes. ? Walkers. ? Scooters. ? Crutches.  Turn on the lights when you go into a dark area. Replace any light bulbs as soon as they burn out.  Set up your furniture so you have a clear path.  Avoid moving your furniture around.  If any of your floors are uneven, fix them.  If there are any pets around you, be aware of where they are.  Review your medicines with your doctor. Some medicines can make you feel dizzy. This can increase your chance of falling. Ask your doctor what other things that you can do to help prevent falls. This information is not intended to replace advice given to you by your health care provider. Make sure you discuss any questions you have with your health care provider. Document Released: 12/06/2008 Document Revised: 07/18/2015 Document Reviewed: 03/16/2014 Elsevier Interactive Patient Education  2018 [place] After being diagnosed with an anxiety disorder, you may be relieved to know why you have felt or behaved a certain way. It is natural to also feel overwhelmed about the treatment ahead and what it will mean for your life. With care and support, you can manage this condition and recover from it. How to cope with anxiety Dealing with stress Stress is your body's reaction to life changes and events, both good and bad. Stress can last just a few hours or it can be ongoing. Stress can play a major role in anxiety, so it is important to learn both how to cope with stress and how to think about it differently. Talk with your health care provider or a counselor to learn more about stress reduction. He or she may suggest some stress reduction techniques, such as:  Music therapy. This can include creating or listening to music that you enjoy and that inspires you.  Mindfulness-based meditation. This involves being aware of your normal breaths, rather than trying to control your breathing. It can be done while sitting or walking.  Centering prayer. This is a kind of meditation that involves focusing on a word, phrase, or sacred image that is meaningful to you and that brings you peace.  Deep breathing. To do this, expand your stomach  and inhale slowly through your nose. Hold your breath for 3-5 seconds. Then exhale slowly, allowing your stomach muscles to relax.  Self-talk. This is a skill where you identify thought patterns that lead to anxiety reactions and correct those thoughts.  Muscle relaxation. This involves tensing muscles then relaxing them.  Choose a stress reduction technique that fits your lifestyle and personality. Stress reduction techniques take time and practice. Set aside 5-15 minutes a day to do them. Therapists can offer  training in these techniques. The training may be covered by some insurance plans. Other things you can do to manage stress include:  Keeping a stress diary. This can help you learn what triggers your stress and ways to control your response.  Thinking about how you respond to certain situations. You may not be able to control everything, but you can control your reaction.  Making time for activities that help you relax, and not feeling guilty about spending your time in this way.  Therapy combined with coping and stress-reduction skills provides the best chance for successful treatment. Medicines Medicines can help ease symptoms. Medicines for anxiety include:  Anti-anxiety drugs.  Antidepressants.  Beta-blockers.  Medicines may be used as the main treatment for anxiety disorder, along with therapy, or if other treatments are not working. Medicines should be prescribed by a health care provider. Relationships Relationships can play a big part in helping you recover. Try to spend more time connecting with trusted friends and family members. Consider going to couples counseling, taking family education classes, or going to family therapy. Therapy can help you and others better understand the condition. How to recognize changes in your condition Everyone has a different response to treatment for anxiety. Recovery from anxiety happens when symptoms decrease and stop interfering with  your daily activities at home or work. This may mean that you will start to:  Have better concentration and focus.  Sleep better.  Be less irritable.  Have more energy.  Have improved memory.  It is important to recognize when your condition is getting worse. Contact your health care provider if your symptoms interfere with home or work and you do not feel like your condition is improving. Where to find help and support: You can get help and support from these sources:  Self-help groups.  Online and [employer].  A trusted spiritual leader.  Couples counseling.  Family education classes.  Family therapy.  Follow these instructions at home:  Eat a healthy diet that includes plenty of vegetables, fruits, whole grains, low-fat dairy products, and lean protein. Do not eat a lot of foods that are high in solid fats, added sugars, or salt.  Exercise. Most adults should do the following: ? Exercise for at least 150 minutes each week. The exercise should increase your heart rate and make you sweat (moderate-intensity exercise). ? Strengthening exercises at least twice a week.  Cut down on caffeine, tobacco, alcohol, and other potentially harmful substances.  Get the right amount and quality of sleep. Most adults need 7-9 hours of sleep each night.  Make choices that simplify your life.  Take over-the-counter and prescription medicines only as told by your health care provider.  Avoid caffeine, alcohol, and certain over-the-counter cold medicines. These may make you feel worse. Ask your pharmacist which medicines to avoid.  Keep all follow-up visits as told by your health care provider. This is important. Questions to ask your health care provider  Would I benefit from therapy?  How often should I follow up with a health care provider?  How long do I need to take medicine?  Are there any long-term side effects of my medicine?  Are there any alternatives to  taking medicine? Contact a health care provider if:  You have a hard time staying focused or finishing daily tasks.  You spend many hours a day feeling worried about everyday life.  You become exhausted by worry.  You start to have headaches, feel tense, or have nausea.  You urinate more than normal.  You have diarrhea. Get help right away if:  You have a racing heart and shortness of breath.  You have thoughts of hurting yourself or others. If you ever feel like you may hurt yourself or others, or have thoughts about taking your own life, get help right away. You can go to your nearest emergency department or call:  Your local emergency services (911 in the [place]  A suicide crisis helpline, such as the [facility] at 984-838-9357. This is open 24-hours a day.  Summary  Taking steps to deal with stress can help calm you.  Medicines cannot cure anxiety disorders, but they can help ease symptoms.  Family, friends, and partners can play a big part in helping you recover from an anxiety disorder. This information is not intended to replace advice given to you by your health care provider. Make sure you discuss any questions you have with your health care provider. Document Released: 02/04/2016 Document Revised: 02/04/2016 Document Reviewed: 02/04/2016 Elsevier Interactive Patient Education  [employer].

## 2017-11-18 NOTE — Telephone Encounter (Signed)
Patient is undecided about starting Prolia. She and her kids are hesitant but she will consider it. She spoke with Shelah Lewandowsky today about taking a holiday from Fosamax and the benefits of starting Prolia. Discussed cost with patient and provided [employer] information. She will let us know if she decides to start the injections.

## 2017-11-19 LAB — CMP14+EGFR
A/G RATIO: 1.8 (ref 1.2–2.2)
ALT: 12 IU/L (ref 0–32)
AST: 23 IU/L (ref 0–40)
Albumin: 4.4 g/dL (ref 3.5–4.7)
Alkaline Phosphatase: 58 IU/L (ref 39–117)
BILIRUBIN TOTAL: 0.6 mg/dL (ref 0.0–1.2)
BUN/Creatinine Ratio: 16 (ref 12–28)
BUN: 22 mg/dL (ref 8–27)
CALCIUM: 9.5 mg/dL (ref 8.7–10.3)
CHLORIDE: 103 mmol/L (ref 96–106)
CO2: 23 mmol/L (ref 20–29)
Creatinine, Ser: 1.36 mg/dL — ABNORMAL HIGH (ref 0.57–1.00)
GFR calc Af Amer: 42 mL/min/{1.73_m2} — ABNORMAL LOW (ref >59)
GFR calc non Af Amer: 36 mL/min/{1.73_m2} — ABNORMAL LOW (ref >59)
Globulin, Total: 2.5 g/dL (ref 1.5–4.5)
Glucose: 100 mg/dL — ABNORMAL HIGH (ref 65–99)
Potassium: 3.9 mmol/L (ref 3.5–5.2)
Sodium: 143 mmol/L (ref 134–144)
Total Protein: 6.9 g/dL (ref 6.0–8.5)

## 2017-11-19 LAB — LIPID PANEL
CHOL/HDL RATIO: 2 ratio (ref 0.0–4.4)
Cholesterol, Total: 132 mg/dL (ref 100–199)
HDL: 67 mg/dL (ref >39)
LDL Calculated: 46 mg/dL (ref 0–99)
Triglycerides: 97 mg/dL (ref 0–149)
VLDL Cholesterol Cal: 19 mg/dL (ref 5–40)

## 2017-11-19 LAB — THYROID PANEL WITH TSH
Free Thyroxine Index: 2.7 (ref 1.2–4.9)
T3 UPTAKE RATIO: 30 % (ref 24–39)
T4 TOTAL: 9.1 ug/dL (ref 4.5–12.0)
TSH: 1.93 u[IU]/mL (ref 0.450–4.500)

## 2017-11-22 MED ORDER — AMLODIPINE BESYLATE 10 MG PO TABS: 10.0000 mg | ORAL_TABLET | Freq: Every day | ORAL | 1 refills | Status: DC

## 2017-11-22 MED ORDER — LEVOTHYROXINE SODIUM 50 MCG PO TABS: ORAL_TABLET | ORAL | 1 refills | Status: DC

## 2017-11-22 NOTE — Addendum Note (Signed)
Addended by: Antonietta Barcelona D on: 11/22/2017 11:12 AM   Modules accepted: Orders

## 2017-11-25 ENCOUNTER — Telehealth: Payer: Self-pay | Admitting: Nurse Practitioner

## 2017-11-26 MED ORDER — VITAMIN D3 25 MCG (1000 UT) PO CAPS: 1.0000 | ORAL_CAPSULE | Freq: Every day | ORAL | 5 refills | Status: DC

## 2018-01-04 ENCOUNTER — Other Ambulatory Visit: Payer: Self-pay | Admitting: *Deleted

## 2018-01-04 MED ORDER — TRIAMCINOLONE ACETONIDE 0.1 % EX CREA: TOPICAL_CREAM | Freq: Two times a day (BID) | CUTANEOUS | 3 refills | Status: DC

## 2018-02-24 ENCOUNTER — Ambulatory Visit (INDEPENDENT_AMBULATORY_CARE_PROVIDER_SITE_OTHER): Payer: Medicare HMO | Admitting: Nurse Practitioner

## 2018-02-24 ENCOUNTER — Encounter: Payer: Self-pay | Admitting: Nurse Practitioner

## 2018-02-24 VITALS — BP 131/77 | HR 63 | Temp 96.7°F | Ht 63.0 in | Wt 157.0 lb

## 2018-02-24 DIAGNOSIS — F411 Generalized anxiety disorder: Secondary | ICD-10-CM

## 2018-02-24 DIAGNOSIS — I10 Essential (primary) hypertension: Secondary | ICD-10-CM

## 2018-02-24 DIAGNOSIS — N183 Chronic kidney disease, stage 3 unspecified: Secondary | ICD-10-CM

## 2018-02-24 DIAGNOSIS — M810 Age-related osteoporosis without current pathological fracture: Secondary | ICD-10-CM | POA: Diagnosis not present

## 2018-02-24 DIAGNOSIS — Z6827 Body mass index (BMI) 27.0-27.9, adult: Secondary | ICD-10-CM | POA: Diagnosis not present

## 2018-02-24 DIAGNOSIS — E038 Other specified hypothyroidism: Secondary | ICD-10-CM

## 2018-02-24 DIAGNOSIS — R69 Illness, unspecified: Secondary | ICD-10-CM | POA: Diagnosis not present

## 2018-02-24 DIAGNOSIS — E782 Mixed hyperlipidemia: Secondary | ICD-10-CM

## 2018-02-24 MED ORDER — AMLODIPINE BESYLATE 10 MG PO TABS: 10.0000 mg | ORAL_TABLET | Freq: Every day | ORAL | 1 refills | Status: DC

## 2018-02-24 MED ORDER — LEVOTHYROXINE SODIUM 50 MCG PO TABS: ORAL_TABLET | ORAL | 1 refills | Status: DC

## 2018-02-24 MED ORDER — CITALOPRAM HYDROBROMIDE 20 MG PO TABS: 20.0000 mg | ORAL_TABLET | Freq: Every day | ORAL | 5 refills | Status: DC

## 2018-02-24 MED ORDER — METOPROLOL TARTRATE 50 MG PO TABS: 50.0000 mg | ORAL_TABLET | Freq: Two times a day (BID) | ORAL | 1 refills | Status: DC

## 2018-02-24 MED ORDER — FUROSEMIDE 20 MG PO TABS: 20.0000 mg | ORAL_TABLET | Freq: Every day | ORAL | 1 refills | Status: DC

## 2018-02-24 MED ORDER — SIMVASTATIN 20 MG PO TABS: 20.0000 mg | ORAL_TABLET | Freq: Every day | ORAL | 1 refills | Status: DC

## 2018-02-24 NOTE — Patient Instructions (Signed)
Fall Prevention in the Home, Adult  Falls can cause injuries. They can happen to people of all ages. There are many things you can do to make your home safe and to help prevent falls. Ask for help when making these changes, if needed.  What actions can I take to prevent falls?  General Instructions  · Use good lighting in all rooms. Replace any light bulbs that burn out.  · Turn on the lights when you go into a dark area. Use night-lights.  · Keep items that you use often in easy-to-reach places. Lower the shelves around your home if necessary.  · Set up your furniture so you have a clear path. Avoid moving your furniture around.  · Do not have throw rugs and other things on the floor that can make you trip.  · Avoid walking on wet floors.  · If any of your floors are uneven, fix them.  · Add color or contrast paint or tape to clearly mark and help you see:  ? Any grab bars or handrails.  ? First and last steps of stairways.  ? Where the edge of each step is.  · If you use a stepladder:  ? Make sure that it is fully opened. Do not climb a closed stepladder.  ? Make sure that both sides of the stepladder are locked into place.  ? Ask someone to hold the stepladder for you while you use it.  · If there are any pets around you, be aware of where they are.  What can I do in the bathroom?         · Keep the floor dry. Clean up any water that spills onto the floor as soon as it happens.  · Remove soap buildup in the tub or shower regularly.  · Use non-skid mats or decals on the floor of the tub or shower.  · Attach bath mats securely with double-sided, non-slip rug tape.  · If you need to sit down in the shower, use a plastic, non-slip stool.  · Install grab bars by the toilet and in the tub and shower. Do not use towel bars as grab bars.  What can I do in the bedroom?  · Make sure that you have a light by your bed that is easy to reach.  · Do not use any sheets or blankets that are too big for your bed. They should  not hang down onto the floor.  · Have a firm chair that has side arms. You can use this for support while you get dressed.  What can I do in the kitchen?  · Clean up any spills right away.  · If you need to reach something above you, use a strong step stool that has a grab bar.  · Keep electrical cords out of the way.  · Do not use floor polish or wax that makes floors slippery. If you must use wax, use non-skid floor wax.  What can I do with my stairs?  · Do not leave any items on the stairs.  · Make sure that you have a light switch at the top of the stairs and the bottom of the stairs. If you do not have them, ask someone to add them for you.  · Make sure that there are handrails on both sides of the stairs, and use them. Fix handrails that are broken or loose. Make sure that handrails are as long as the stairways.  ·   Install non-slip stair treads on all stairs in your home.  · Avoid having throw rugs at the top or bottom of the stairs. If you do have throw rugs, attach them to the floor with carpet tape.  · Choose a carpet that does not hide the edge of the steps on the stairway.  · Check any carpeting to make sure that it is firmly attached to the stairs. Fix any carpet that is loose or worn.  What can I do on the outside of my home?  · Use bright outdoor lighting.  · Regularly fix the edges of walkways and driveways and fix any cracks.  · Remove anything that might make you trip as you walk through a door, such as a raised step or threshold.  · Trim any bushes or trees on the path to your home.  · Regularly check to see if handrails are loose or broken. Make sure that both sides of any steps have handrails.  · Install guardrails along the edges of any raised decks and porches.  · Clear walking paths of anything that might make someone trip, such as tools or rocks.  · Have any leaves, snow, or ice cleared regularly.  · Use sand or salt on walking paths during winter.  · Clean up any spills in your garage right  away. This includes grease or oil spills.  What other actions can I take?  · Wear shoes that:  ? Have a low heel. Do not wear high heels.  ? Have rubber bottoms.  ? Are comfortable and fit you well.  ? Are closed at the toe. Do not wear open-toe sandals.  · Use tools that help you move around (mobility aids) if they are needed. These include:  ? Canes.  ? Walkers.  ? Scooters.  ? Crutches.  · Review your medicines with your doctor. Some medicines can make you feel dizzy. This can increase your chance of falling.  Ask your doctor what other things you can do to help prevent falls.  Where to find more information  · Centers for Disease Control and Prevention, STEAD[employer]: https://cdc.gov  · National Institute on Aging: https://go4life.nia.nih.gov  Contact a doctor if:  · You are afraid of falling at home.  · You feel weak, drowsy, or dizzy at home.  · You fall at home.  Summary  · There are many simple things that you can do to make your home safe and to help prevent falls.  · Ways to make your home safe include removing tripping hazards and installing grab bars in the bathroom.  · Ask for help when making these changes in your home.  This information is not intended to replace advice given to you by your health care provider. Make sure you discuss any questions you have with your health care provider.  Document Released: 12/06/2008 Document Revised: 09/24/2016 Document Reviewed: 09/24/2016  Elsevier Interactive Patient Education © 2019 Elsevier Inc[employer]

## 2018-02-24 NOTE — Progress Notes (Signed)
Subjective:    Patient ID: Audrey Manning, female    DOB: [birth year], 85 y.o.   MRN: [id number]   Chief Complaint: Medical Management of Chronic Issues   HPI:  1. Essential hypertension  Mo c/o chest pain, sob or headaches. Does not check blood pressure at home. BP Readings from Last 3 Encounters:  02/24/18 131/77  11/18/17 (!) 116/58  10/05/17 135/71     2. Other specified hypothyroidism  No problems that sh es aware of.  3. Age-related osteoporosis without current pathological fracture  Last dexscan was done 11/30/16 with t score of -3.5. she does very little weight bearing exercises. Is not on fosamax for th elast 3 months. She has been on tem for years. She refuses prolia.  4. CKD (chronic kidney disease) stage 3, GFR 30-59 ml/min (HCC)  Will be checking labs today  5. Mixed hyperlipidemia  Does not watch diet very closely  6. BMI 28.0-28.9,adult  Weight is down 4 lbs since last visit    Outpatient Encounter Medications as of 02/24/2018  Medication Sig  . alendronate (FOSAMAX) 70 MG tablet Take 1 tablet (70 mg total) by mouth every 7 (seven) days. Take with a full glass of water on an empty stomach.  Marland Kitchen amLODipine (NORVASC) 10 MG tablet Take 1 tablet (10 mg total) by mouth daily.  . Calcium Carbonate-Vitamin D (CALCIUM 500 + D) 500-125 MG-UNIT TABS Take 500 mg by mouth daily.  . Cholecalciferol (VITAMIN D3) 1000 units CAPS Take 1 capsule (1,000 Units total) by mouth daily.  . citalopram (CELEXA) 20 MG tablet Take 1 tablet (20 mg total) by mouth daily.  . furosemide (LASIX) 20 MG tablet Take 1 tablet (20 mg total) by mouth daily.  Marland Kitchen levothyroxine (SYNTHROID, LEVOTHROID) 50 MCG tablet TAKE ONE TABLET BY MOUTH ONCE DAILY BEFORE BREAKFAST  . meclizine (ANTIVERT) 25 MG tablet Take 1 tablet (25 mg total) by mouth 3 (three) times daily as needed for dizziness.  . metoprolol tartrate (LOPRESSOR) 50 MG tablet Take 1 tablet (50 mg total) by mouth 2 (two) times daily.  . simvastatin  (ZOCOR) 20 MG tablet Take 1 tablet (20 mg total) by mouth at bedtime.  . triamcinolone cream (KENALOG) 0.1 % Apply topically 2 (two) times daily.       New complaints: None today  Social history: Lives with husband   Review of Systems  Constitutional: Negative for activity change and appetite change.  HENT: Negative.   Eyes: Negative for pain.  Respiratory: Negative for shortness of breath.   Cardiovascular: Negative for chest pain, palpitations and leg swelling.  Gastrointestinal: Negative for abdominal pain.  Endocrine: Negative for polydipsia.  Genitourinary: Negative.   Skin: Negative for rash.  Neurological: Negative for dizziness, weakness and headaches.  Hematological: Does not bruise/bleed easily.  Psychiatric/Behavioral: Negative.   All other systems reviewed and are negative.      Objective:   Physical Exam Vitals signs and nursing note reviewed.  Constitutional:      General: She is not in acute distress.    Appearance: Normal appearance. She is well-developed.  HENT:     Head: Normocephalic.     Nose: Nose normal.  Eyes:     Pupils: Pupils are equal, round, and reactive to light.  Neck:     Musculoskeletal: Normal range of motion and neck supple.     Vascular: No carotid bruit or JVD.  Cardiovascular:     Rate and Rhythm: Normal rate and regular rhythm.  Heart sounds: Normal heart sounds.  Pulmonary:     Effort: Pulmonary effort is normal. No respiratory distress.     Breath sounds: Normal breath sounds. No wheezing or rales.  Chest:     Chest wall: No tenderness.  Abdominal:     General: Bowel sounds are normal. There is no distension or abdominal bruit.     Palpations: Abdomen is soft. There is no hepatomegaly, splenomegaly, mass or pulsatile mass.     Tenderness: There is no abdominal tenderness.  Musculoskeletal: Normal range of motion.     Comments: Gait slow and steady  Lymphadenopathy:     Cervical: No cervical adenopathy.  Skin:     General: Skin is warm and dry.  Neurological:     Mental Status: She is alert and oriented to person, place, and time.     Deep Tendon Reflexes: Reflexes are normal and symmetric.  Psychiatric:        Behavior: Behavior normal.        Thought Content: Thought content normal.        Judgment: Judgment normal.      BP 131/77   Pulse 63   Temp (!) 96.7 F (35.9 C) (Oral)   Ht 5' 3"  (1.6 m)   Wt 157 lb (71.2 kg)   BMI 27.81 kg/m       Assessment & Plan:  Audrey Manning comes in today with chief complaint of Medical Management of Chronic Issues   Diagnosis and orders addressed:  1. Essential hypertension Low sodium diet - metoprolol tartrate (LOPRESSOR) 50 MG tablet; Take 1 tablet (50 mg total) by mouth 2 (two) times daily.  Dispense: 180 tablet; Refill: 1 - furosemide (LASIX) 20 MG tablet; Take 1 tablet (20 mg total) by mouth daily.  Dispense: 90 tablet; Refill: 1 - amLODipine (NORVASC) 10 MG tablet; Take 1 tablet (10 mg total) by mouth daily.  Dispense: 90 tablet; Refill: 1 - CMP14+EGFR  2. Other specified hypothyroidism - levothyroxine (SYNTHROID, LEVOTHROID) 50 MCG tablet; TAKE ONE TABLET BY MOUTH ONCE DAILY BEFORE BREAKFAST  Dispense: 90 tablet; Refill: 1  3. Age-related osteoporosis without current pathological fracture Weight bearing exercises encouraged discussed prolia and patient still refusing  4. CKD (chronic kidney disease) stage 3, GFR 30-59 ml/min (HCC) Labs pending  5. Mixed hyperlipidemia Low fat diet - simvastatin (ZOCOR) 20 MG tablet; Take 1 tablet (20 mg total) by mouth at bedtime.  Dispense: 90 tablet; Refill: 1 - Lipid panel  6. BMI 27.0-27.9,adult Discussed diet and exercise for person with BMI >25 Will recheck weight in 3-6 months  7. Generalized anxiety disorder stress management - citalopram (CELEXA) 20 MG tablet; Take 1 tablet (20 mg total) by mouth daily.  Dispense: 30 tablet; Refill: 5   Labs pending Health Maintenance reviewed Diet  and exercise encouraged  Follow up plan: 3 months   [name] Done, FNP

## 2018-02-25 LAB — CMP14+EGFR
ALK PHOS: 68 IU/L (ref 39–117)
ALT: 11 IU/L (ref 0–32)
AST: 27 IU/L (ref 0–40)
Albumin/Globulin Ratio: 1.7 (ref 1.2–2.2)
Albumin: 4.5 g/dL (ref 3.5–4.7)
BUN/Creatinine Ratio: 20 (ref 12–28)
BUN: 29 mg/dL — ABNORMAL HIGH (ref 8–27)
Bilirubin Total: 0.6 mg/dL (ref 0.0–1.2)
CO2: 22 mmol/L (ref 20–29)
Calcium: 9.6 mg/dL (ref 8.7–10.3)
Chloride: 100 mmol/L (ref 96–106)
Creatinine, Ser: 1.42 mg/dL — ABNORMAL HIGH (ref 0.57–1.00)
GFR calc Af Amer: 39 mL/min/{1.73_m2} — ABNORMAL LOW (ref >59)
GFR calc non Af Amer: 34 mL/min/{1.73_m2} — ABNORMAL LOW (ref >59)
Globulin, Total: 2.6 g/dL (ref 1.5–4.5)
Glucose: 102 mg/dL — ABNORMAL HIGH (ref 65–99)
Potassium: 3.9 mmol/L (ref 3.5–5.2)
Sodium: 139 mmol/L (ref 134–144)
Total Protein: 7.1 g/dL (ref 6.0–8.5)

## 2018-02-25 LAB — LIPID PANEL
Chol/HDL Ratio: 2.2 ratio (ref 0.0–4.4)
Cholesterol, Total: 138 mg/dL (ref 100–199)
HDL: 64 mg/dL (ref >39)
LDL Calculated: 49 mg/dL (ref 0–99)
TRIGLYCERIDES: 124 mg/dL (ref 0–149)
VLDL Cholesterol Cal: 25 mg/dL (ref 5–40)

## 2018-03-07 ENCOUNTER — Ambulatory Visit: Payer: Self-pay | Admitting: Licensed Clinical Social Worker

## 2018-03-07 DIAGNOSIS — F411 Generalized anxiety disorder: Secondary | ICD-10-CM

## 2018-03-07 NOTE — Patient Instructions (Signed)
Licensed Clinical Social Worker Visit Information  Ms. Dripps was given information about Chronic Care Management services today including:  1. CCM service includes personalized support from designated clinical staff supervised by her physician, including individualized plan of care and coordination with other care providers 2. 24/7 contact phone numbers for assistance for urgent and routine care needs. 3. Service will only be billed when office clinical staff spend 20 minutes or more in a month to coordinate care. 4. Only one practitioner may furnish and bill the service in a calendar month. 5. The patient may stop CCM services at any time (effective at the end of the month) by phone call to the office staff. 6. The patient will be responsible for cost sharing (co-pay) of up to 20% of the service fee (after annual deductible is met).   Patient did not agree to services and wishes to consider information provided before deciding about enrollment in CCM services.  The patient verbalized understanding of instructions provided today and declined a print copy of patient instruction materials.    Plan:  .  LCSW will reach out to patient by phone to follow up regarding patient decision about enrollment in CCM services.   Norva Riffle.Sihaam Chrobak MSW, LCSW Licensed Clinical Social Worker [facility] [phone]

## 2018-03-07 NOTE — Chronic Care Management (AMB) (Addendum)
  Chronic Care Management    Clinical Social Work Introduction Note  03/07/2018 Name: Audrey Manning MRN: [id number] DOB: Oct 06, 1932  LCSW called Audrey Manning who is a 85 y.o. year old female who sees [name], FNP for primary care. I called Audrey Manning today to provide information about clinical social work services available for assessment of psychosocial needs and supportt.   Patient and/or legal guardian verbally consented to speak with LCSW about presenting concerns.  Audrey Manning was given information about Chronic Care Management services today including:  1. CCM service includes personalized support from designated clinical staff supervised by her physician, including individualized plan of care and coordination with other care providers 2. 24/7 contact phone numbers for assistance for urgent and routine care needs. 3. Service will only be billed when office clinical staff spend 20 minutes or more in a month to coordinate care. 4. Only one practitioner may furnish and bill the service in a calendar month. 5. The patient may stop CCM services at any time (effective at the end of the month) by phone call to the office staff. 6. The patient will be responsible for cost sharing (co-pay) of up to 20% of the service fee (after annual deductible is met).  Patient did not agree to services and wishes to consider information provided before deciding about enrollment in CCM services.  Plan:  LCSW will reach out to patient by phone to follow up regarding patient decision about enrollment in CCM service  Audrey Manning MSW, LCSW Licensed Clinical Social Worker [facility] [phone]  I agree with chronic care management plan- Audrey Hassell Done, FNP

## 2018-03-14 DIAGNOSIS — H524 Presbyopia: Secondary | ICD-10-CM | POA: Diagnosis not present

## 2018-03-14 DIAGNOSIS — H52229 Regular astigmatism, unspecified eye: Secondary | ICD-10-CM | POA: Diagnosis not present

## 2018-03-14 DIAGNOSIS — H5213 Myopia, bilateral: Secondary | ICD-10-CM | POA: Diagnosis not present

## 2018-03-14 DIAGNOSIS — H2510 Age-related nuclear cataract, unspecified eye: Secondary | ICD-10-CM | POA: Diagnosis not present

## 2018-03-14 DIAGNOSIS — H5203 Hypermetropia, bilateral: Secondary | ICD-10-CM | POA: Diagnosis not present

## 2018-03-15 ENCOUNTER — Other Ambulatory Visit: Payer: Self-pay | Admitting: Physician Assistant

## 2018-03-22 ENCOUNTER — Ambulatory Visit: Payer: Self-pay | Admitting: Licensed Clinical Social Worker

## 2018-03-22 DIAGNOSIS — I10 Essential (primary) hypertension: Secondary | ICD-10-CM

## 2018-03-22 DIAGNOSIS — F411 Generalized anxiety disorder: Secondary | ICD-10-CM

## 2018-03-22 DIAGNOSIS — N183 Chronic kidney disease, stage 3 unspecified: Secondary | ICD-10-CM

## 2018-03-22 NOTE — Chronic Care Management (AMB) (Signed)
  Chronic Care Management    Clinical Social Work General Follow Up Note  03/22/2018 Name: Marcie Shearon MRN: [id number] DOB: Oct 29, 1932  Referred by: PCP, [name] FNP for psychosocial assessment  Review of patient status, including review of consultants reports, relevant laboratory and other test results, and collaboration with appropriate care team members and the patient's provider was performed as part of comprehensive patient evaluation and provision of chronic care management services.    Last CCM Appointment: 03/22/2018  Depression screen PHQ 2/9 02/24/2018  Decreased Interest 0  Down, Depressed, Hopeless 0  PHQ - 2 Score 0     GAD 7 : Generalized Anxiety Score 11/18/2017  Nervous, Anxious, on Edge 3  Control/stop worrying 0  Worry too much - different things 0  Trouble relaxing 0  Restless 3  Easily annoyed or irritable 0  Afraid - awful might happen 0  Total GAD 7 Score 6  Anxiety Difficulty Not difficult at all    Ms. Bordelon was given information about Chronic Care Management services today including:  1. CCM service includes personalized support from designated clinical staff supervised by her physician, including individualized plan of care and coordination with other care providers 2. 24/7 contact phone numbers for assistance for urgent and routine care needs. 3. Service will only be billed when office clinical staff spend 20 minutes or more in a month to coordinate care. 4. Only one practitioner may furnish and bill the service in a calendar month. 5. The patient may stop CCM services at any time (effective at the end of the month) by phone call to the office staff. 6. The patient will be responsible for cost sharing (co-pay) of up to 20% of the service fee (after annual deductible is met).  Patient did not agree to services and does not wish to consider at this time.   Follow Up Plan: Client to attend medical appointments with [name] FNP  as scheduled.    Norva Riffle.Reyne Falconi MSW, LCSW Licensed Clinical Social Worker [facility] [phone]

## 2018-03-22 NOTE — Patient Instructions (Signed)
Licensed Clinical Social Worker Visit Information  Ms. Granja was given information about Chronic Care Management services today including:  1. CCM service includes personalized support from designated clinical staff supervised by her physician, including individualized plan of care and coordination with other care providers 2. 24/7 contact phone numbers for assistance for urgent and routine care needs. 3. Service will only be billed when office clinical staff spend 20 minutes or more in a month to coordinate care. 4. Only one practitioner may furnish and bill the service in a calendar month. 5. The patient may stop CCM services at any time (effective at the end of the month) by phone call to the office staff. 6. The patient will be responsible for cost sharing (co-pay) of up to 20% of the service fee (after annual deductible is met).  Patient did not agree to services and does not wish to consider at this time.   Materials Provided: No  The patient verbalized understanding of instructions provided today and declined a print copy of patient instruction materials.   Follow Up Plan: Client to attend medical appointments with [name] FNP as scheduled  Norva Riffle.Velicia Dejager MSW, LCSW Licensed Clinical Social Worker [facility] [phone]

## 2018-04-02 DIAGNOSIS — Z0100 Encounter for examination of eyes and vision without abnormal findings: Secondary | ICD-10-CM | POA: Diagnosis not present

## 2018-05-26 ENCOUNTER — Other Ambulatory Visit: Payer: Self-pay

## 2018-05-26 ENCOUNTER — Encounter: Payer: Self-pay | Admitting: Nurse Practitioner

## 2018-05-26 ENCOUNTER — Ambulatory Visit (INDEPENDENT_AMBULATORY_CARE_PROVIDER_SITE_OTHER): Payer: Medicare HMO | Admitting: Nurse Practitioner

## 2018-05-26 VITALS — BP 128/58 | HR 61 | Temp 98.0°F | Ht 63.0 in | Wt 155.0 lb

## 2018-05-26 DIAGNOSIS — J309 Allergic rhinitis, unspecified: Secondary | ICD-10-CM | POA: Diagnosis not present

## 2018-05-26 DIAGNOSIS — M810 Age-related osteoporosis without current pathological fracture: Secondary | ICD-10-CM

## 2018-05-26 DIAGNOSIS — E782 Mixed hyperlipidemia: Secondary | ICD-10-CM

## 2018-05-26 DIAGNOSIS — R69 Illness, unspecified: Secondary | ICD-10-CM | POA: Diagnosis not present

## 2018-05-26 DIAGNOSIS — N183 Chronic kidney disease, stage 3 unspecified: Secondary | ICD-10-CM

## 2018-05-26 DIAGNOSIS — I10 Essential (primary) hypertension: Secondary | ICD-10-CM | POA: Diagnosis not present

## 2018-05-26 DIAGNOSIS — E038 Other specified hypothyroidism: Secondary | ICD-10-CM | POA: Diagnosis not present

## 2018-05-26 DIAGNOSIS — F411 Generalized anxiety disorder: Secondary | ICD-10-CM

## 2018-05-26 DIAGNOSIS — Z6828 Body mass index (BMI) 28.0-28.9, adult: Secondary | ICD-10-CM

## 2018-05-26 MED ORDER — LEVOTHYROXINE SODIUM 50 MCG PO TABS: ORAL_TABLET | ORAL | 1 refills | Status: DC

## 2018-05-26 MED ORDER — CITALOPRAM HYDROBROMIDE 20 MG PO TABS: 20.0000 mg | ORAL_TABLET | Freq: Every day | ORAL | 5 refills | Status: DC

## 2018-05-26 MED ORDER — ALENDRONATE SODIUM 70 MG PO TABS: 70.0000 mg | ORAL_TABLET | ORAL | 1 refills | Status: DC

## 2018-05-26 MED ORDER — AMLODIPINE BESYLATE 10 MG PO TABS: 10.0000 mg | ORAL_TABLET | Freq: Every day | ORAL | 1 refills | Status: DC

## 2018-05-26 MED ORDER — METOPROLOL TARTRATE 50 MG PO TABS: 50.0000 mg | ORAL_TABLET | Freq: Two times a day (BID) | ORAL | 1 refills | Status: DC

## 2018-05-26 MED ORDER — SIMVASTATIN 20 MG PO TABS: 20.0000 mg | ORAL_TABLET | Freq: Every day | ORAL | 1 refills | Status: DC

## 2018-05-26 MED ORDER — FUROSEMIDE 20 MG PO TABS: 20.0000 mg | ORAL_TABLET | Freq: Every day | ORAL | 1 refills | Status: DC

## 2018-05-26 NOTE — Progress Notes (Signed)
Patient ID: Audrey Manning, female   DOB: 1932-12-22, 85 y.o.   MRN: [id number]    Virtual Visit via telephone Note  I connected with Audrey Manning on 05/26/18 at home by telephone and verified that I am speaking with the correct person using two identifiers. Audrey Manning is currently located at home and her husband is currently with her during visit. The provider, [name] Done, FNP is located in their office at time of visit.  I discussed the limitations, risks, security and privacy concerns of performing an evaluation and management service by telephone and the availability of in person appointments. I also discussed with the patient that there may be a patient responsible charge related to this service. The patient expressed understanding and agreed to proceed.   History and Present Illness:   Chief Complaint: Medical Management of Chronic Issues   HPI:  1. Essential hypertension  No c/o chest pain, sob or headache. Does not check blood pressure at home.  2. Other specified hypothyroidism  No problems that aware of.  3. Age-related osteoporosis without current pathological fracture  Last dexascan was done 12/10/16 with tscore of -3.5. she does stay active but does no designated exercise.  4. CKD (chronic kidney disease) stage 3, GFR 30-59 ml/min (HCC)  Last creatine was 1.42.  5. Mixed hyperlipidemia  Does not really watch diet  6. Allergic rhinitis, unspecified seasonality, unspecified trigger  Doing ok as long as she does not stay outside  7. BMI 28.0-28.9,adult  No weight changes    Outpatient Encounter Medications as of 05/26/2018  Medication Sig  . alendronate (FOSAMAX) 70 MG tablet Take 1 tablet (70 mg total) by mouth every 7 (seven) days. Take with a full glass of water on an empty stomach.  Marland Kitchen amLODipine (NORVASC) 10 MG tablet Take 1 tablet (10 mg total) by mouth daily.  . Calcium Carbonate-Vitamin D (CALCIUM 500 + D) 500-125 MG-UNIT TABS Take 500 mg by mouth  daily.  . Cholecalciferol (VITAMIN D3) 1000 units CAPS Take 1 capsule (1,000 Units total) by mouth daily.  . citalopram (CELEXA) 20 MG tablet Take 1 tablet (20 mg total) by mouth daily.  . furosemide (LASIX) 20 MG tablet Take 1 tablet (20 mg total) by mouth daily.  Marland Kitchen levothyroxine (SYNTHROID, LEVOTHROID) 50 MCG tablet TAKE ONE TABLET BY MOUTH ONCE DAILY BEFORE BREAKFAST  . meclizine (ANTIVERT) 25 MG tablet Take 1 tablet (25 mg total) by mouth 3 (three) times daily as needed for dizziness.  . metoprolol tartrate (LOPRESSOR) 50 MG tablet Take 1 tablet (50 mg total) by mouth 2 (two) times daily.  . simvastatin (ZOCOR) 20 MG tablet Take 1 tablet (20 mg total) by mouth at bedtime.  . triamcinolone cream (KENALOG) 0.1 % Apply topically 2 (two) times daily.      New complaints: None today  Social history: Lives with  Husband  Review of Systems  Constitutional: Negative for diaphoresis and weight loss.  Eyes: Negative for blurred vision, double vision and pain.  Respiratory: Negative for shortness of breath.   Cardiovascular: Negative for chest pain, palpitations, orthopnea and leg swelling.  Gastrointestinal: Negative for abdominal pain.  Skin: Negative for rash.  Neurological: Negative for dizziness, sensory change, loss of consciousness, weakness and headaches.  Endo/Heme/Allergies: Negative for polydipsia. Does not bruise/bleed easily.  Psychiatric/Behavioral: Negative for memory loss. The patient does not have insomnia.   All other systems reviewed and are negative.     Observations/Objective: Alert and oriented- answers all questions appropriately BP Marland Kitchen)  128/58   Pulse 61   Temp 98 F (36.7 C) (Oral)   Ht 5\' 3"  (1.6 m)   Wt 155 lb (70.3 kg)   BMI 27.46 kg/m  taken by patient and reported No SOB witnessed Self reported mild lower ext edema.   Assessment and Plan: Audrey Manning calls in today with chief complaint of Medical Management of Chronic Issues   Diagnosis and  orders addressed:  1. Essential hypertension Low sodium diet - metoprolol tartrate (LOPRESSOR) 50 MG tablet; Take 1 tablet (50 mg total) by mouth 2 (two) times daily.  Dispense: 180 tablet; Refill: 1 - furosemide (LASIX) 20 MG tablet; Take 1 tablet (20 mg total) by mouth daily.  Dispense: 90 tablet; Refill: 1 - amLODipine (NORVASC) 10 MG tablet; Take 1 tablet (10 mg total) by mouth daily.  Dispense: 90 tablet; Refill: 1  2. Other specified hypothyroidism - levothyroxine (SYNTHROID, LEVOTHROID) 50 MCG tablet; TAKE ONE TABLET BY MOUTH ONCE DAILY BEFORE BREAKFAST  Dispense: 90 tablet; Refill: 1  3. Age-related osteoporosis without current pathological fracture Weight bearing exercise encouraged - alendronate (FOSAMAX) 70 MG tablet; Take 1 tablet (70 mg total) by mouth every 7 (seven) days. Take with a full glass of water on an empty stomach.  Dispense: 12 tablet; Refill: 1  4. CKD (chronic kidney disease) stage 3, GFR 30-59 ml/min (HCC) Will do labs when no longer quarantined for covid 19  5. Mixed hyperlipidemia Low fat diet encouraged - simvastatin (ZOCOR) 20 MG tablet; Take 1 tablet (20 mg total) by mouth at bedtime.  Dispense: 90 tablet; Refill: 1  6. Allergic rhinitis, unspecified seasonality, unspecified trigger  7. BMI 28.0-28.9,adult Discussed diet and exercise for person with BMI >25 Will recheck weight in 3-6 months  8. Generalized anxiety disorder Stress management - citalopram (CELEXA) 20 MG tablet; Take 1 tablet (20 mg total) by mouth daily.  Dispense: 30 tablet; Refill: 5   Previous labs results reviewed Health Maintenance reviewed Diet and exercise encouraged   Follow Up Instructions: 3 months    I discussed the assessment and treatment plan with the patient. The patient was provided an opportunity to ask questions and all were answered. The patient agreed with the plan and demonstrated an understanding of the instructions.   The patient was advised to call  back or seek an in-person evaluation if the symptoms worsen or if the condition fails to improve as anticipated.  The above assessment and management plan was discussed with the patient. The patient verbalized understanding of and has agreed to the management plan. Patient is aware to call the clinic if symptoms persist or worsen. Patient is aware when to return to the clinic for a follow-up visit. Patient educated on when it is appropriate to go to the emergency department.    I provided 12 minutes of non-face-to-face time during this encounter.    [name] Done, FNP

## 2018-11-22 ENCOUNTER — Ambulatory Visit (INDEPENDENT_AMBULATORY_CARE_PROVIDER_SITE_OTHER): Payer: Medicare HMO | Admitting: Nurse Practitioner

## 2018-11-22 ENCOUNTER — Encounter: Payer: Self-pay | Admitting: Nurse Practitioner

## 2018-11-22 DIAGNOSIS — Z6828 Body mass index (BMI) 28.0-28.9, adult: Secondary | ICD-10-CM | POA: Diagnosis not present

## 2018-11-22 DIAGNOSIS — E782 Mixed hyperlipidemia: Secondary | ICD-10-CM | POA: Diagnosis not present

## 2018-11-22 DIAGNOSIS — E039 Hypothyroidism, unspecified: Secondary | ICD-10-CM

## 2018-11-22 DIAGNOSIS — N183 Chronic kidney disease, stage 3 unspecified: Secondary | ICD-10-CM

## 2018-11-22 DIAGNOSIS — M810 Age-related osteoporosis without current pathological fracture: Secondary | ICD-10-CM

## 2018-11-22 DIAGNOSIS — I10 Essential (primary) hypertension: Secondary | ICD-10-CM | POA: Diagnosis not present

## 2018-11-22 DIAGNOSIS — E038 Other specified hypothyroidism: Secondary | ICD-10-CM

## 2018-11-22 DIAGNOSIS — F411 Generalized anxiety disorder: Secondary | ICD-10-CM

## 2018-11-22 DIAGNOSIS — R69 Illness, unspecified: Secondary | ICD-10-CM | POA: Diagnosis not present

## 2018-11-22 MED ORDER — FUROSEMIDE 20 MG PO TABS: 20.0000 mg | ORAL_TABLET | Freq: Every day | ORAL | 1 refills | Status: DC

## 2018-11-22 MED ORDER — ALENDRONATE SODIUM 70 MG PO TABS: 70.0000 mg | ORAL_TABLET | ORAL | 1 refills | Status: DC

## 2018-11-22 MED ORDER — SIMVASTATIN 20 MG PO TABS: 20.0000 mg | ORAL_TABLET | Freq: Every day | ORAL | 1 refills | Status: DC

## 2018-11-22 MED ORDER — METOPROLOL TARTRATE 50 MG PO TABS: 50.0000 mg | ORAL_TABLET | Freq: Two times a day (BID) | ORAL | 1 refills | Status: DC

## 2018-11-22 MED ORDER — AMLODIPINE BESYLATE 10 MG PO TABS: 10.0000 mg | ORAL_TABLET | Freq: Every day | ORAL | 1 refills | Status: DC

## 2018-11-22 MED ORDER — LEVOTHYROXINE SODIUM 50 MCG PO TABS: ORAL_TABLET | ORAL | 1 refills | Status: DC

## 2018-11-22 MED ORDER — CITALOPRAM HYDROBROMIDE 20 MG PO TABS: 20.0000 mg | ORAL_TABLET | Freq: Every day | ORAL | 1 refills | Status: DC

## 2018-11-22 NOTE — Progress Notes (Signed)
Virtual Visit via telephone Note Due to COVID-19 pandemic this visit was conducted virtually. This visit type was conducted due to national recommendations for restrictions regarding the COVID-19 Pandemic (e.g. social distancing, sheltering in place) in an effort to limit this patient's exposure and mitigate transmission in our community. All issues noted in this document were discussed and addressed.  A physical exam was not performed with this format.  I connected with Audrey Manning on 11/22/18 at 8:45 by telephone and verified that I am speaking with the correct person using two identifiers. Audrey Manning is currently located at home  and he rhusband is currently with her during visit. The provider, [name] Done, FNP is located in their office at time of visit.  I discussed the limitations, risks, security and privacy concerns of performing an evaluation and management service by telephone and the availability of in person appointments. I also discussed with the patient that there may be a patient responsible charge related to this service. The patient expressed understanding and agreed to proceed.   History and Present Illness:   Chief Complaint: Medical Management of Chronic Issues    HPI:  1. Essential hypertension No c/o chest pain, sob or headache. Does not check blood pressure at home. BP Readings from Last 3 Encounters:  05/26/18 (!) 128/58  02/24/18 131/77  11/18/17 (!) 116/58     2. Mixed hyperlipidemia Does not watch diet and does no exercise. Lab Results  Component Value Date   CHOL 138 02/24/2018   HDL 64 02/24/2018   LDLCALC 49 02/24/2018   TRIG 124 02/24/2018   CHOLHDL 2.2 02/24/2018     3. Acquired hypothyroidism No problem that she is aware of. Lab Results  Component Value Date   TSH 1.930 11/18/2017     4. CKD (chronic kidney disease) stage 3, GFR 30-59 ml/min (HCC) Lab Results  Component Value Date   CREATININE 1.42 (H) 02/24/2018      5. Age-related osteoporosis without current pathological fracture Last dexascan was in 2018 with t score -3.5. sh ehas been on fasamax for about 3 years after taking a break for over a year  6. BMI 28.0-28.9,adult No recent weight changes Wt Readings from Last 3 Encounters:  05/26/18 155 lb (70.3 kg)  02/24/18 157 lb (71.2 kg)  11/18/17 161 lb 4 oz (73.1 kg)   BMI Readings from Last 3 Encounters:  05/26/18 27.46 kg/m  02/24/18 27.81 kg/m  11/18/17 28.56 kg/m      Outpatient Encounter Medications as of 11/22/2018  Medication Sig  . alendronate (FOSAMAX) 70 MG tablet Take 1 tablet (70 mg total) by mouth every 7 (seven) days. Take with a full glass of water on an empty stomach.  Marland Kitchen amLODipine (NORVASC) 10 MG tablet Take 1 tablet (10 mg total) by mouth daily.  . Calcium Carbonate-Vitamin D (CALCIUM 500 + D) 500-125 MG-UNIT TABS Take 500 mg by mouth daily.  . Cholecalciferol (VITAMIN D3) 1000 units CAPS Take 1 capsule (1,000 Units total) by mouth daily.  . citalopram (CELEXA) 20 MG tablet Take 1 tablet (20 mg total) by mouth daily.  . furosemide (LASIX) 20 MG tablet Take 1 tablet (20 mg total) by mouth daily.  Marland Kitchen levothyroxine (SYNTHROID, LEVOTHROID) 50 MCG tablet TAKE ONE TABLET BY MOUTH ONCE DAILY BEFORE BREAKFAST  . meclizine (ANTIVERT) 25 MG tablet Take 1 tablet (25 mg total) by mouth 3 (three) times daily as needed for dizziness.  . metoprolol tartrate (LOPRESSOR) 50 MG tablet Take 1 tablet (  50 mg total) by mouth 2 (two) times daily.  . simvastatin (ZOCOR) 20 MG tablet Take 1 tablet (20 mg total) by mouth at bedtime.  . triamcinolone cream (KENALOG) 0.1 % Apply topically 2 (two) times daily.     Past Surgical History:  Procedure Laterality Date  . APPENDECTOMY    . TONSILLECTOMY      Family History  Problem Relation Age of Onset  . Heart disease Father     New complaints: None today  Social history: Lives with her husband  Controlled substance contract: n/a     Review of Systems  Constitutional: Negative for diaphoresis and weight loss.  Eyes: Negative for blurred vision, double vision and pain.  Respiratory: Negative for shortness of breath.   Cardiovascular: Negative for chest pain, palpitations, orthopnea and leg swelling.  Gastrointestinal: Negative for abdominal pain.  Musculoskeletal: Positive for back pain (chronic).  Skin: Negative for rash.  Neurological: Negative for dizziness, sensory change, loss of consciousness, weakness and headaches.  Endo/Heme/Allergies: Negative for polydipsia. Does not bruise/bleed easily.  Psychiatric/Behavioral: Negative for memory loss. The patient does not have insomnia.   All other systems reviewed and are negative.    Observations/Objective: Alert and oriented- answers all questions appropriately No distress    Assessment and Plan: Audrey Manning comes in today with chief complaint of Medical Management of Chronic Issues   Diagnosis and orders addressed:  1. Essential hypertension Low salt diet - metoprolol tartrate (LOPRESSOR) 50 MG tablet; Take 1 tablet (50 mg total) by mouth 2 (two) times daily.  Dispense: 180 tablet; Refill: 1 - furosemide (LASIX) 20 MG tablet; Take 1 tablet (20 mg total) by mouth daily.  Dispense: 90 tablet; Refill: 1 - amLODipine (NORVASC) 10 MG tablet; Take 1 tablet (10 mg total) by mouth daily.  Dispense: 90 tablet; Refill: 1  2. Mixed hyperlipidemia Low fat diet - simvastatin (ZOCOR) 20 MG tablet; Take 1 tablet (20 mg total) by mouth at bedtime.  Dispense: 90 tablet; Refill: 1  3. Acquired hypothyroidism Continue meds - levothyroxine (SYNTHROID) 50 MCG tablet; TAKE ONE TABLET BY MOUTH ONCE DAILY BEFORE BREAKFAST  Dispense: 90 tablet; Refill: 1  4. CKD (chronic kidney disease) stage 3, GFR 30-59 ml/min (HCC) Will order labs when she come sin wit her husband for his visit  5. Age-related osteoporosis without current pathological fracture Weight bearing exercises  encouraged - alendronate (FOSAMAX) 70 MG tablet; Take 1 tablet (70 mg total) by mouth every 7 (seven) days. Take with a full glass of water on an empty stomach.  Dispense: 12 tablet; Refill: 1  6. BMI 28.0-28.9,adult Discussed diet and exercise for person with BMI >25 Will recheck weight in 3-6 months  7. Generalized anxiety disorder Stress management - citalopram (CELEXA) 20 MG tablet; Take 1 tablet (20 mg total) by mouth daily.  Dispense: 90 tablet; Refill: 1    Labs pending Health Maintenance reviewed Diet and exercise encouraged  Follow Up Instructions: 3 months    I discussed the assessment and treatment plan with the patient. The patient was provided an opportunity to ask questions and all were answered. The patient agreed with the plan and demonstrated an understanding of the instructions.   The patient was advised to call back or seek an in-person evaluation if the symptoms worsen or if the condition fails to improve as anticipated.  The above assessment and management plan was discussed with the patient. The patient verbalized understanding of and has agreed to the management plan. Patient is  aware to call the clinic if symptoms persist or worsen. Patient is aware when to return to the clinic for a follow-up visit. Patient educated on when it is appropriate to go to the emergency department.   Time call ended:  9:00  I provided 15 minutes of non-face-to-face time during this encounter.    [name] Done, FNP

## 2018-11-28 ENCOUNTER — Other Ambulatory Visit: Payer: Self-pay

## 2018-11-29 ENCOUNTER — Other Ambulatory Visit: Payer: Self-pay | Admitting: Nurse Practitioner

## 2018-11-29 ENCOUNTER — Ambulatory Visit (INDEPENDENT_AMBULATORY_CARE_PROVIDER_SITE_OTHER): Payer: Medicare HMO

## 2018-11-29 DIAGNOSIS — E782 Mixed hyperlipidemia: Secondary | ICD-10-CM

## 2018-11-29 DIAGNOSIS — I10 Essential (primary) hypertension: Secondary | ICD-10-CM | POA: Diagnosis not present

## 2018-11-29 DIAGNOSIS — E039 Hypothyroidism, unspecified: Secondary | ICD-10-CM | POA: Diagnosis not present

## 2018-11-29 DIAGNOSIS — Z23 Encounter for immunization: Secondary | ICD-10-CM

## 2018-11-30 LAB — CMP14+EGFR
ALT: 11 IU/L (ref 0–32)
AST: 25 IU/L (ref 0–40)
Albumin/Globulin Ratio: 1.6 (ref 1.2–2.2)
Albumin: 4.4 g/dL (ref 3.6–4.6)
Alkaline Phosphatase: 71 IU/L (ref 39–117)
BUN/Creatinine Ratio: 20 (ref 12–28)
BUN: 26 mg/dL (ref 8–27)
Bilirubin Total: 0.5 mg/dL (ref 0.0–1.2)
CO2: 21 mmol/L (ref 20–29)
Calcium: 9.4 mg/dL (ref 8.7–10.3)
Chloride: 104 mmol/L (ref 96–106)
Creatinine, Ser: 1.29 mg/dL — ABNORMAL HIGH (ref 0.57–1.00)
GFR calc Af Amer: 44 mL/min/{1.73_m2} — ABNORMAL LOW (ref >59)
GFR calc non Af Amer: 38 mL/min/{1.73_m2} — ABNORMAL LOW (ref >59)
Globulin, Total: 2.8 g/dL (ref 1.5–4.5)
Glucose: 106 mg/dL — ABNORMAL HIGH (ref 65–99)
Potassium: 4.1 mmol/L (ref 3.5–5.2)
Sodium: 138 mmol/L (ref 134–144)
Total Protein: 7.2 g/dL (ref 6.0–8.5)

## 2018-11-30 LAB — LIPID PANEL
Chol/HDL Ratio: 1.8 ratio (ref 0.0–4.4)
Cholesterol, Total: 125 mg/dL (ref 100–199)
HDL: 69 mg/dL (ref >39)
LDL Chol Calc (NIH): 40 mg/dL (ref 0–99)
Triglycerides: 82 mg/dL (ref 0–149)
VLDL Cholesterol Cal: 16 mg/dL (ref 5–40)

## 2018-11-30 LAB — THYROID PANEL WITH TSH
Free Thyroxine Index: 2.8 (ref 1.2–4.9)
T3 Uptake Ratio: 30 % (ref 24–39)
T4, Total: 9.2 ug/dL (ref 4.5–12.0)
TSH: 1.38 u[IU]/mL (ref 0.450–4.500)

## 2018-12-28 ENCOUNTER — Ambulatory Visit (INDEPENDENT_AMBULATORY_CARE_PROVIDER_SITE_OTHER): Payer: Medicare HMO | Admitting: *Deleted

## 2018-12-28 DIAGNOSIS — Z0000 Encounter for general adult medical examination without abnormal findings: Secondary | ICD-10-CM | POA: Diagnosis not present
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
## 2019-03-03 ENCOUNTER — Ambulatory Visit: Payer: Self-pay | Admitting: Nurse Practitioner

## 2019-03-23 ENCOUNTER — Ambulatory Visit (INDEPENDENT_AMBULATORY_CARE_PROVIDER_SITE_OTHER): Payer: Medicare HMO | Admitting: Nurse Practitioner

## 2019-03-23 ENCOUNTER — Encounter: Payer: Self-pay | Admitting: Nurse Practitioner

## 2019-03-23 DIAGNOSIS — N1831 Chronic kidney disease, stage 3a: Secondary | ICD-10-CM

## 2019-03-23 DIAGNOSIS — E782 Mixed hyperlipidemia: Secondary | ICD-10-CM | POA: Diagnosis not present

## 2019-03-23 DIAGNOSIS — Z6828 Body mass index (BMI) 28.0-28.9, adult: Secondary | ICD-10-CM | POA: Diagnosis not present

## 2019-03-23 DIAGNOSIS — M810 Age-related osteoporosis without current pathological fracture: Secondary | ICD-10-CM | POA: Diagnosis not present

## 2019-03-23 DIAGNOSIS — I10 Essential (primary) hypertension: Secondary | ICD-10-CM | POA: Diagnosis not present

## 2019-03-23 DIAGNOSIS — E039 Hypothyroidism, unspecified: Secondary | ICD-10-CM

## 2019-03-23 MED ORDER — FUROSEMIDE 20 MG PO TABS: 20.0000 mg | ORAL_TABLET | Freq: Every day | ORAL | 1 refills | Status: DC

## 2019-03-23 MED ORDER — AMLODIPINE BESYLATE 10 MG PO TABS: 10.0000 mg | ORAL_TABLET | Freq: Every day | ORAL | 1 refills | Status: DC

## 2019-03-23 MED ORDER — SIMVASTATIN 20 MG PO TABS: 20.0000 mg | ORAL_TABLET | Freq: Every day | ORAL | 1 refills | Status: DC

## 2019-03-23 MED ORDER — METOPROLOL TARTRATE 50 MG PO TABS: 50.0000 mg | ORAL_TABLET | Freq: Two times a day (BID) | ORAL | 1 refills | Status: DC

## 2019-03-23 MED ORDER — LEVOTHYROXINE SODIUM 50 MCG PO TABS: ORAL_TABLET | ORAL | 1 refills | Status: DC

## 2019-03-23 NOTE — Progress Notes (Signed)
Patient ID: Audrey Manning, female   DOB: 1932/09/21, 86 y.o.   MRN: [id number]   Virtual Visit via telephone Note Due to COVID-19 pandemic this visit was conducted virtually. This visit type was conducted due to national recommendations for restrictions regarding the COVID-19 Pandemic (e.g. social distancing, sheltering in place) in an effort to limit this patient's exposure and mitigate transmission in our community. All issues noted in this document were discussed and addressed.  A physical exam was not performed with this format.  I connected with [name] on 03/23/19 at 9:10 by telephone and verified that I am speaking with the correct person using two identifiers. [name] is currently located at home and her husband is currently with her during visit. The provider, [name] Done, FNP is located in their office at time of visit.  I discussed the limitations, risks, security and privacy concerns of performing an evaluation and management service by telephone and the availability of in person appointments. I also discussed with the patient that there may be a patient responsible charge related to this service. The patient expressed understanding and agreed to proceed.   History and Present Illness:   Chief Complaint: Medical Management of Chronic Issues    HPI:  1. Essential hypertension No c/o chest pain, sob or headache. Does not check blood pressure at home. BP Readings from Last 3 Encounters:  05/26/18 (!) 128/58  02/24/18 131/77  11/18/17 (!) 116/58     2. Mixed hyperlipidemia Does not watch diet and does very little exercise. Lab Results  Component Value Date   CHOL 125 11/29/2018   HDL 69 11/29/2018   LDLCALC 40 11/29/2018   TRIG 82 11/29/2018   CHOLHDL 1.8 11/29/2018     3. Acquired hypothyroidism No problem that she is aware. Lab Results  Component Value Date   TSH 1.380 11/29/2018    4. Stage 3a chronic kidney disease No problems  voiding Lab Results  Component Value Date   CREATININE 1.29 (H) 11/29/2018     5. Age-related osteoporosis without current pathological fracture Last dexacan was in 2015. She refuses to have any more dexascans.  She does very little weight bearing exercise.  6. BMI 28.0-28.9,adult Weight is down 6lbs Wt Readings from Last 3 Encounters:  05/26/18 155 lb (70.3 kg)  02/24/18 157 lb (71.2 kg)  11/18/17 161 lb 4 oz (73.1 kg)      Outpatient Encounter Medications as of 03/23/2019  Medication Sig  . alendronate (FOSAMAX) 70 MG tablet Take 1 tablet (70 mg total) by mouth every 7 (seven) days. Take with a full glass of water on an empty stomach.  Marland Kitchen amLODipine (NORVASC) 10 MG tablet Take 1 tablet (10 mg total) by mouth daily.  . Calcium Carbonate-Vitamin D (CALCIUM 500 + D) 500-125 MG-UNIT TABS Take 500 mg by mouth daily.  . Cholecalciferol (VITAMIN D3) 1000 units CAPS Take 1 capsule (1,000 Units total) by mouth daily.  . furosemide (LASIX) 20 MG tablet Take 1 tablet (20 mg total) by mouth daily.  Marland Kitchen levothyroxine (SYNTHROID) 50 MCG tablet TAKE ONE TABLET BY MOUTH ONCE DAILY BEFORE BREAKFAST  . metoprolol tartrate (LOPRESSOR) 50 MG tablet Take 1 tablet (50 mg total) by mouth 2 (two) times daily.  . simvastatin (ZOCOR) 20 MG tablet Take 1 tablet (20 mg total) by mouth at bedtime.  . triamcinolone cream (KENALOG) 0.1 % Apply topically 2 (two) times daily.    Past Surgical History:  Procedure Laterality Date  . APPENDECTOMY    .  TONSILLECTOMY      Family History  Problem Relation Age of Onset  . Heart disease Father     New complaints: None today  Social history: Lives with her husband  Controlled substance contract: n/a    Review of Systems  Constitutional: Negative for diaphoresis and weight loss.  Eyes: Negative for blurred vision, double vision and pain.  Respiratory: Negative for shortness of breath.   Cardiovascular: Negative for chest pain, palpitations, orthopnea  and leg swelling.  Gastrointestinal: Negative for abdominal pain.  Skin: Negative for rash.  Neurological: Negative for dizziness, sensory change, loss of consciousness, weakness and headaches.  Endo/Heme/Allergies: Negative for polydipsia. Does not bruise/bleed easily.  Psychiatric/Behavioral: Negative for memory loss. The patient does not have insomnia.   All other systems reviewed and are negative.    Observations/Objective: Alert and oriented- answers all questions appropriately No distress Bp- 125/88 p 58- patient reported   Assessment and Plan: [name] comes in today with chief complaint of Medical Management of Chronic Issues   Diagnosis and orders addressed:  1. Essential hypertension Low sodium diet - metoprolol tartrate (LOPRESSOR) 50 MG tablet; Take 1 tablet (50 mg total) by mouth 2 (two) times daily.  Dispense: 180 tablet; Refill: 1 - furosemide (LASIX) 20 MG tablet; Take 1 tablet (20 mg total) by mouth daily.  Dispense: 90 tablet; Refill: 1 - amLODipine (NORVASC) 10 MG tablet; Take 1 tablet (10 mg total) by mouth daily.  Dispense: 90 tablet; Refill: 1  2. Mixed hyperlipidemia Low fat diet - simvastatin (ZOCOR) 20 MG tablet; Take 1 tablet (20 mg total) by mouth at bedtime.  Dispense: 90 tablet; Refill: 1  3. Acquired hypothyroidism - levothyroxine (SYNTHROID) 50 MCG tablet; TAKE ONE TABLET BY MOUTH ONCE DAILY BEFORE BREAKFAST  Dispense: 90 tablet; Refill: 1  4. Stage 3a chronic kidney disease Labs pending  5. Age-related osteoporosis without current pathological fracture Add vitamin d and calcium supplement to meds  6. BMI 28.0-28.9,adult Discussed diet and exercise for person with BMI >25 Will recheck weight in 3-6 months   Labs pending Health Maintenance reviewed Diet and exercise encouraged  Follow up plan: 3 months      I discussed the assessment and treatment plan with the patient. The patient was provided an opportunity to ask questions  and all were answered. The patient agreed with the plan and demonstrated an understanding of the instructions.   The patient was advised to call back or seek an in-person evaluation if the symptoms worsen or if the condition fails to improve as anticipated.  The above assessment and management plan was discussed with the patient. The patient verbalized understanding of and has agreed to the management plan. Patient is aware to call the clinic if symptoms persist or worsen. Patient is aware when to return to the clinic for a follow-up visit. Patient educated on when it is appropriate to go to the emergency department.   Time call ended:  9:25  I provided 15 minutes of non-face-to-face time during this encounter.    [name] Done, FNP

## 2019-04-12 ENCOUNTER — Other Ambulatory Visit: Payer: Self-pay | Admitting: Nurse Practitioner

## 2019-05-10 ENCOUNTER — Telehealth: Payer: Self-pay | Admitting: Family Medicine

## 2019-05-10 MED ORDER — MELOXICAM 15 MG PO TABS: 15.0000 mg | ORAL_TABLET | Freq: Every day | ORAL | 0 refills | Status: DC

## 2019-05-10 NOTE — Telephone Encounter (Signed)
Due to kidney function- cannot take naprosyn but can do mobic daily- will send in rx.

## 2019-05-19 ENCOUNTER — Ambulatory Visit (INDEPENDENT_AMBULATORY_CARE_PROVIDER_SITE_OTHER): Payer: Medicare HMO | Admitting: Nurse Practitioner

## 2019-05-19 ENCOUNTER — Other Ambulatory Visit: Payer: Self-pay

## 2019-05-19 ENCOUNTER — Encounter: Payer: Self-pay | Admitting: Nurse Practitioner

## 2019-05-19 VITALS — BP 152/66 | HR 65 | Temp 98.0°F | Resp 20 | Ht 63.0 in | Wt 153.0 lb

## 2019-05-19 DIAGNOSIS — M810 Age-related osteoporosis without current pathological fracture: Secondary | ICD-10-CM | POA: Diagnosis not present

## 2019-05-19 DIAGNOSIS — E782 Mixed hyperlipidemia: Secondary | ICD-10-CM | POA: Diagnosis not present

## 2019-05-19 DIAGNOSIS — I10 Essential (primary) hypertension: Secondary | ICD-10-CM

## 2019-05-19 DIAGNOSIS — N1831 Chronic kidney disease, stage 3a: Secondary | ICD-10-CM | POA: Diagnosis not present

## 2019-05-19 DIAGNOSIS — E039 Hypothyroidism, unspecified: Secondary | ICD-10-CM | POA: Diagnosis not present

## 2019-05-19 DIAGNOSIS — Z6828 Body mass index (BMI) 28.0-28.9, adult: Secondary | ICD-10-CM | POA: Diagnosis not present

## 2019-05-19 MED ORDER — SIMVASTATIN 20 MG PO TABS: 20.0000 mg | ORAL_TABLET | Freq: Every day | ORAL | 1 refills | Status: DC

## 2019-05-19 MED ORDER — LEVOTHYROXINE SODIUM 50 MCG PO TABS: ORAL_TABLET | ORAL | 1 refills | Status: DC

## 2019-05-19 MED ORDER — AMLODIPINE BESYLATE 10 MG PO TABS: 10.0000 mg | ORAL_TABLET | Freq: Every day | ORAL | 1 refills | Status: DC

## 2019-05-19 MED ORDER — FUROSEMIDE 20 MG PO TABS: 20.0000 mg | ORAL_TABLET | Freq: Every day | ORAL | 1 refills | Status: DC

## 2019-05-19 MED ORDER — METOPROLOL TARTRATE 50 MG PO TABS: 50.0000 mg | ORAL_TABLET | Freq: Two times a day (BID) | ORAL | 1 refills | Status: DC

## 2019-05-19 NOTE — Progress Notes (Signed)
Subjective:    Patient ID: Audrey Manning, female    DOB: 25-Apr-1932, 86 y.o.   MRN: [id number]   Chief Complaint: Medical Management of Chronic Issues    HPI:  1. Essential hypertension No c/o chest pain, sob or headache. doesnot check blood pressure at home. BP Readings from Last 3 Encounters:  05/19/19 (!) 152/66  05/26/18 (!) 128/58  02/24/18 131/77     2. Acquired hypothyroidism No problems that aware of  3. Mixed hyperlipidemia Tries to watch diet and does very little exercise. Lab Results  Component Value Date   CHOL 125 11/29/2018   HDL 69 11/29/2018   LDLCALC 40 11/29/2018   TRIG 82 11/29/2018   CHOLHDL 1.8 11/29/2018    4. Stage 3a chronic kidney disease No problems voiding Lab Results  Component Value Date   CREATININE 1.29 (H) 11/29/2018     5. Age-related osteoporosis without current pathological fracture Does no weight bearing exercises. She has aged out of dexascans.  6. BMI 28.0-28.9,adult No weight changes Wt Readings from Last 3 Encounters:  05/19/19 153 lb (69.4 kg)  05/26/18 155 lb (70.3 kg)  02/24/18 157 lb (71.2 kg)   BMI Readings from Last 3 Encounters:  05/19/19 27.10 kg/m  05/26/18 27.46 kg/m  02/24/18 27.81 kg/m       Outpatient Encounter Medications as of 05/19/2019  Medication Sig  . amLODipine (NORVASC) 10 MG tablet Take 1 tablet (10 mg total) by mouth daily.  . Calcium Carbonate-Vitamin D (CALCIUM 500 + D) 500-125 MG-UNIT TABS Take 500 mg by mouth daily.  . Cholecalciferol (VITAMIN D3) 1000 units CAPS Take 1 capsule (1,000 Units total) by mouth daily.  . furosemide (LASIX) 20 MG tablet Take 1 tablet (20 mg total) by mouth daily.  Marland Kitchen levothyroxine (SYNTHROID) 50 MCG tablet TAKE ONE TABLET BY MOUTH ONCE DAILY BEFORE BREAKFAST  . meloxicam (MOBIC) 15 MG tablet Take 1 tablet (15 mg total) by mouth daily.  . metoprolol tartrate (LOPRESSOR) 50 MG tablet Take 1 tablet (50 mg total) by mouth 2 (two) times daily.  .  simvastatin (ZOCOR) 20 MG tablet Take 1 tablet (20 mg total) by mouth at bedtime.  . triamcinolone cream (KENALOG) 0.1 % APPLY TO AFFECTED AREA TWICE DAILY     Past Surgical History:  Procedure Laterality Date  . APPENDECTOMY    . TONSILLECTOMY      Family History  Problem Relation Age of Onset  . Heart disease Father     New complaints: None today  Social history: Lives with husband  Controlled substance contract: n/a    Review of Systems  Constitutional: Negative for diaphoresis.  Eyes: Negative for pain.  Respiratory: Negative for shortness of breath.   Cardiovascular: Negative for chest pain, palpitations and leg swelling.  Gastrointestinal: Negative for abdominal pain.  Endocrine: Negative for polydipsia.  Skin: Negative for rash.  Neurological: Negative for dizziness, weakness and headaches.  Hematological: Does not bruise/bleed easily.  All other systems reviewed and are negative.      Objective:   Physical Exam Vitals and nursing note reviewed.  Constitutional:      General: She is not in acute distress.    Appearance: Normal appearance. She is well-developed.  HENT:     Head: Normocephalic.     Nose: Nose normal.  Eyes:     Pupils: Pupils are equal, round, and reactive to light.  Neck:     Vascular: No carotid bruit or JVD.  Cardiovascular:  Rate and Rhythm: Normal rate and regular rhythm.     Heart sounds: Normal heart sounds.  Pulmonary:     Effort: Pulmonary effort is normal. No respiratory distress.     Breath sounds: Normal breath sounds. No wheezing or rales.  Chest:     Chest wall: No tenderness.  Abdominal:     General: Bowel sounds are normal. There is no distension or abdominal bruit.     Palpations: Abdomen is soft. There is no hepatomegaly, splenomegaly, mass or pulsatile mass.     Tenderness: There is no abdominal tenderness.  Musculoskeletal:        General: Normal range of motion.     Cervical back: Normal range of motion  and neck supple.  Lymphadenopathy:     Cervical: No cervical adenopathy.  Skin:    General: Skin is warm and dry.  Neurological:     Mental Status: She is alert and oriented to person, place, and time.     Deep Tendon Reflexes: Reflexes are normal and symmetric.  Psychiatric:        Behavior: Behavior normal.        Thought Content: Thought content normal.        Judgment: Judgment normal.    BP (!) 152/66   Pulse 65   Temp 98 F (36.7 C) (Temporal)   Resp 20   Ht 5' 3"  (1.6 m)   Wt 153 lb (69.4 kg)   SpO2 97%   BMI 27.10 kg/m         Assessment & Plan:  Rehmat Murtagh comes in today with chief complaint of Medical Management of Chronic Issues   Diagnosis and orders addressed:  1. Essential hypertension Low sodium diet - metoprolol tartrate (LOPRESSOR) 50 MG tablet; Take 1 tablet (50 mg total) by mouth 2 (two) times daily.  Dispense: 180 tablet; Refill: 1 - furosemide (LASIX) 20 MG tablet; Take 1 tablet (20 mg total) by mouth daily.  Dispense: 90 tablet; Refill: 1 - amLODipine (NORVASC) 10 MG tablet; Take 1 tablet (10 mg total) by mouth daily.  Dispense: 90 tablet; Refill: 1 - CBC with Differential/Platelet - CMP14+EGFR  2. Acquired hypothyroidism  - levothyroxine (SYNTHROID) 50 MCG tablet; TAKE ONE TABLET BY MOUTH ONCE DAILY BEFORE BREAKFAST  Dispense: 90 tablet; Refill: 1 - Thyroid Panel With TSH  3. Mixed hyperlipidemia Low fat diet - simvastatin (ZOCOR) 20 MG tablet; Take 1 tablet (20 mg total) by mouth at bedtime.  Dispense: 90 tablet; Refill: 1 - Lipid panel  4. Stage 3a chronic kidney disease Labs pending  5. Age-related osteoporosis without current pathological fracture Weight bearing exercises encouraged  6. BMI 28.0-28.9,adult Discussed diet and exercise for person with BMI >25 Will recheck weight in 3-6 months    Labs pending Health Maintenance reviewed Diet and exercise encouraged  Follow up plan: 3 months   [name] Done,  FNP

## 2019-05-19 NOTE — Patient Instructions (Signed)

## 2019-05-20 LAB — CBC WITH DIFFERENTIAL/PLATELET
Basophils Absolute: 0 10*3/uL (ref 0.0–0.2)
Basos: 1 %
EOS (ABSOLUTE): 0.1 10*3/uL (ref 0.0–0.4)
Eos: 2 %
Hematocrit: 37.3 % (ref 34.0–46.6)
Hemoglobin: 12.2 g/dL (ref 11.1–15.9)
Immature Grans (Abs): 0 10*3/uL (ref 0.0–0.1)
Immature Granulocytes: 0 %
Lymphocytes Absolute: 1.9 10*3/uL (ref 0.7–3.1)
Lymphs: 36 %
MCH: 32 pg (ref 26.6–33.0)
MCHC: 32.7 g/dL (ref 31.5–35.7)
MCV: 98 fL — ABNORMAL HIGH (ref 79–97)
Monocytes Absolute: 0.7 10*3/uL (ref 0.1–0.9)
Monocytes: 12 %
Neutrophils Absolute: 2.7 10*3/uL (ref 1.4–7.0)
Neutrophils: 49 %
Platelets: 157 10*3/uL (ref 150–450)
RBC: 3.81 x10E6/uL (ref 3.77–5.28)
RDW: 12.5 % (ref 11.7–15.4)
WBC: 5.4 10*3/uL (ref 3.4–10.8)

## 2019-05-20 LAB — LIPID PANEL
Chol/HDL Ratio: 2.1 ratio (ref 0.0–4.4)
Cholesterol, Total: 144 mg/dL (ref 100–199)
HDL: 70 mg/dL (ref >39)
LDL Chol Calc (NIH): 58 mg/dL (ref 0–99)
Triglycerides: 88 mg/dL (ref 0–149)
VLDL Cholesterol Cal: 16 mg/dL (ref 5–40)

## 2019-05-20 LAB — CMP14+EGFR
ALT: 11 IU/L (ref 0–32)
AST: 25 IU/L (ref 0–40)
Albumin/Globulin Ratio: 1.4 (ref 1.2–2.2)
Albumin: 4.2 g/dL (ref 3.6–4.6)
Alkaline Phosphatase: 82 IU/L (ref 39–117)
BUN/Creatinine Ratio: 19 (ref 12–28)
BUN: 27 mg/dL (ref 8–27)
Bilirubin Total: 0.5 mg/dL (ref 0.0–1.2)
CO2: 22 mmol/L (ref 20–29)
Calcium: 9.2 mg/dL (ref 8.7–10.3)
Chloride: 100 mmol/L (ref 96–106)
Creatinine, Ser: 1.4 mg/dL — ABNORMAL HIGH (ref 0.57–1.00)
GFR calc Af Amer: 40 mL/min/{1.73_m2} — ABNORMAL LOW (ref >59)
GFR calc non Af Amer: 35 mL/min/{1.73_m2} — ABNORMAL LOW (ref >59)
Globulin, Total: 3 g/dL (ref 1.5–4.5)
Glucose: 95 mg/dL (ref 65–99)
Potassium: 4.7 mmol/L (ref 3.5–5.2)
Sodium: 137 mmol/L (ref 134–144)
Total Protein: 7.2 g/dL (ref 6.0–8.5)

## 2019-05-20 LAB — THYROID PANEL WITH TSH
Free Thyroxine Index: 2.5 (ref 1.2–4.9)
T3 Uptake Ratio: 30 % (ref 24–39)
T4, Total: 8.3 ug/dL (ref 4.5–12.0)
TSH: 1.39 u[IU]/mL (ref 0.450–4.500)

## 2019-06-27 ENCOUNTER — Ambulatory Visit: Payer: Self-pay | Admitting: Nurse Practitioner

## 2019-07-27 ENCOUNTER — Telehealth: Payer: Self-pay | Admitting: Nurse Practitioner

## 2019-07-28 ENCOUNTER — Other Ambulatory Visit: Payer: Self-pay

## 2019-07-28 ENCOUNTER — Ambulatory Visit (INDEPENDENT_AMBULATORY_CARE_PROVIDER_SITE_OTHER): Payer: Medicare HMO | Admitting: Nurse Practitioner

## 2019-07-28 ENCOUNTER — Encounter: Payer: Self-pay | Admitting: Nurse Practitioner

## 2019-07-28 VITALS — BP 129/61 | HR 65 | Temp 97.2°F | Ht 63.0 in | Wt 157.1 lb

## 2019-07-28 DIAGNOSIS — S80861A Insect bite (nonvenomous), right lower leg, initial encounter: Secondary | ICD-10-CM

## 2019-07-28 DIAGNOSIS — W57XXXA Bitten or stung by nonvenomous insect and other nonvenomous arthropods, initial encounter: Secondary | ICD-10-CM | POA: Diagnosis not present

## 2019-07-28 MED ORDER — DOXYCYCLINE HYCLATE 100 MG PO TABS: 100.0000 mg | ORAL_TABLET | Freq: Two times a day (BID) | ORAL | 0 refills | Status: DC

## 2019-07-28 MED ORDER — TRIAMCINOLONE ACETONIDE 0.1 % EX CREA: TOPICAL_CREAM | CUTANEOUS | 0 refills | Status: DC

## 2019-07-28 NOTE — Progress Notes (Signed)
Subjective:    Patient ID: Audrey Manning, female    DOB: 06-07-1932, 86 y.o.   MRN: [id number]   Chief Complaint: Insect Bite (pt here today c/o what she thinks is a tick bite on her lower right leg-she never saw the tick but has a spot that is red and itches and leg is swollen)   HPI Patient come sin c/o tick bite on right lower leg. Had a hard time pulling it off. Was there at least 24 hours if not longer. Denies any fatigue or headache.   Review of Systems  Constitutional: Negative for diaphoresis.  Eyes: Negative for pain.  Respiratory: Negative for shortness of breath.   Cardiovascular: Negative for chest pain, palpitations and leg swelling.  Gastrointestinal: Negative for abdominal pain.  Endocrine: Negative for polydipsia.  Skin: Negative for rash.  Neurological: Negative for dizziness, weakness and headaches.  Hematological: Does not bruise/bleed easily.  All other systems reviewed and are negative.      Objective:   Physical Exam Vitals and nursing note reviewed.  Constitutional:      General: She is not in acute distress.    Appearance: Normal appearance. She is well-developed.  Neck:     Vascular: No carotid bruit or JVD.  Cardiovascular:     Rate and Rhythm: Normal rate and regular rhythm.     Heart sounds: Normal heart sounds.  Pulmonary:     Effort: Pulmonary effort is normal. No respiratory distress.     Breath sounds: Normal breath sounds. No wheezing or rales.  Chest:     Chest wall: No tenderness.  Abdominal:     General: There is no distension or abdominal bruit.     Palpations: There is no hepatomegaly, splenomegaly, mass or pulsatile mass.     Tenderness: There is no abdominal tenderness.  Musculoskeletal:        General: Normal range of motion.     Cervical back: Normal range of motion and neck supple.     Right lower leg: Edema (2+) present.  Lymphadenopathy:     Cervical: No cervical adenopathy.  Skin:    General: Skin is warm and dry.       Comments: 2cm annular lesion on right lower leg where tick was removed.  Neurological:     Mental Status: She is alert and oriented to person, place, and time.     Deep Tendon Reflexes: Reflexes are normal and symmetric.  Psychiatric:        Behavior: Behavior normal.        Thought Content: Thought content normal.        Judgment: Judgment normal.       BP 129/61    Pulse 65    Temp (!) 97.2 F (36.2 C) (Temporal)    Ht 5\' 3"  (1.6 m)    Wt 157 lb 2 oz (71.3 kg)    BMI 27.83 kg/m         Assessment & Plan:  Shonette Dunleavy in today with chief complaint of Insect Bite (pt here today c/o what she thinks is a tick bite on her lower right leg-she never saw the tick but has a spot that is red and itches and leg is swollen)   1. Tick bite of right lower leg, initial encounter Do not pick or scratch at area Meds ordered this encounter  Medications   triamcinolone cream (KENALOG) 0.1 %    Sig: APPLY TO AFFECTED AREA TWICE DAILY    Dispense:  30 g    Refill:  0    Order Specific Question:   Supervising Provider    Answer:   Caryl Pina A [[id number]]   doxycycline (VIBRA-TABS) 100 MG tablet    Sig: Take 1 tablet (100 mg total) by mouth 2 (two) times daily. 1 po bid    Dispense:  28 tablet    Refill:  0    Order Specific Question:   Supervising Provider    Answer:   Caryl Pina A [[id number]]       The above assessment and management plan was discussed with the patient. The patient verbalized understanding of and has agreed to the management plan. Patient is aware to call the clinic if symptoms persist or worsen. Patient is aware when to return to the clinic for a follow-up visit. Patient educated on when it is appropriate to go to the emergency department.   [name] Done, FNP

## 2019-07-28 NOTE — Patient Instructions (Signed)

## 2019-08-18 ENCOUNTER — Other Ambulatory Visit: Payer: Self-pay | Admitting: Nurse Practitioner

## 2019-08-18 DIAGNOSIS — E039 Hypothyroidism, unspecified: Secondary | ICD-10-CM

## 2019-08-21 NOTE — Telephone Encounter (Signed)
90 day supply with 1 refill was sent in 05/19/19-should have a refill at the pharmacy.

## 2019-08-24 ENCOUNTER — Encounter: Payer: Self-pay | Admitting: Nurse Practitioner

## 2019-08-24 ENCOUNTER — Ambulatory Visit (INDEPENDENT_AMBULATORY_CARE_PROVIDER_SITE_OTHER): Payer: Medicare HMO | Admitting: Nurse Practitioner

## 2019-08-24 ENCOUNTER — Other Ambulatory Visit: Payer: Self-pay

## 2019-08-24 VITALS — BP 142/71 | HR 69 | Temp 97.6°F | Resp 20 | Ht 63.0 in | Wt 156.0 lb

## 2019-08-24 DIAGNOSIS — Z6828 Body mass index (BMI) 28.0-28.9, adult: Secondary | ICD-10-CM | POA: Diagnosis not present

## 2019-08-24 DIAGNOSIS — M810 Age-related osteoporosis without current pathological fracture: Secondary | ICD-10-CM

## 2019-08-24 DIAGNOSIS — N1831 Chronic kidney disease, stage 3a: Secondary | ICD-10-CM

## 2019-08-24 DIAGNOSIS — L03115 Cellulitis of right lower limb: Secondary | ICD-10-CM

## 2019-08-24 DIAGNOSIS — E782 Mixed hyperlipidemia: Secondary | ICD-10-CM | POA: Diagnosis not present

## 2019-08-24 DIAGNOSIS — E039 Hypothyroidism, unspecified: Secondary | ICD-10-CM | POA: Diagnosis not present

## 2019-08-24 DIAGNOSIS — I10 Essential (primary) hypertension: Secondary | ICD-10-CM | POA: Diagnosis not present

## 2019-08-24 MED ORDER — FUROSEMIDE 20 MG PO TABS: 20.0000 mg | ORAL_TABLET | Freq: Every day | ORAL | 1 refills | Status: DC

## 2019-08-24 MED ORDER — LEVOTHYROXINE SODIUM 50 MCG PO TABS: ORAL_TABLET | ORAL | 1 refills | Status: DC

## 2019-08-24 MED ORDER — AMLODIPINE BESYLATE 10 MG PO TABS: 10.0000 mg | ORAL_TABLET | Freq: Every day | ORAL | 1 refills | Status: DC

## 2019-08-24 MED ORDER — SIMVASTATIN 20 MG PO TABS: 20.0000 mg | ORAL_TABLET | Freq: Every day | ORAL | 1 refills | Status: DC

## 2019-08-24 MED ORDER — CEPHALEXIN 500 MG PO CAPS: 500.0000 mg | ORAL_CAPSULE | Freq: Three times a day (TID) | ORAL | 0 refills | Status: DC

## 2019-08-24 MED ORDER — METOPROLOL TARTRATE 50 MG PO TABS: 50.0000 mg | ORAL_TABLET | Freq: Two times a day (BID) | ORAL | 1 refills | Status: DC

## 2019-08-24 NOTE — Progress Notes (Signed)
Subjective:    Patient ID: Nataya Bastedo, female    DOB: 25-Jun-1932, 86 y.o.   MRN: [id number]   Chief Complaint: medical management of chronic issues     HPI:  1. Essential hypertension No c/o chest pain, sob or headache. Does not check blood pressure at home.  BP Readings from Last 3 Encounters:  07/28/19 129/61  05/19/19 (!) 152/66  05/26/18 (!) 128/58     2. Mixed hyperlipidemia Does not really watch diet and does very little exercise. Lab Results  Component Value Date   CHOL 144 05/19/2019   HDL 70 05/19/2019   LDLCALC 58 05/19/2019   TRIG 88 05/19/2019   CHOLHDL 2.1 05/19/2019     3. Acquired hypothyroidism No issues that she is aware of. She has been out of meds for about 1 week.  4. Stage 3a chronic kidney disease No problems voiding. Lab Results  Component Value Date   CREATININE 1.40 (H) 05/19/2019   BUN 27 05/19/2019   NA 137 05/19/2019   K 4.7 05/19/2019   CL 100 05/19/2019   CO2 22 05/19/2019      5. Age-related osteoporosis without current pathological fracture last dexascan was in 2018 with t score of -3.5. does no weight bearing exercises and is on no meds. Refuses to take any meds.  6. BMI 28.0-28.9,adult No recent weight changes Wt Readings from Last 3 Encounters:  08/24/19 156 lb (70.8 kg)  07/28/19 157 lb 2 oz (71.3 kg)  05/19/19 153 lb (69.4 kg)  ] BMI Readings from Last 3 Encounters:  08/24/19 27.63 kg/m  07/28/19 27.83 kg/m  05/19/19 27.10 kg/m      Outpatient Encounter Medications as of 08/24/2019  Medication Sig  . amLODipine (NORVASC) 10 MG tablet Take 1 tablet (10 mg total) by mouth daily.  . Calcium Carbonate-Vitamin D (CALCIUM 500 + D) 500-125 MG-UNIT TABS Take 500 mg by mouth daily.  . Cholecalciferol (VITAMIN D3) 1000 units CAPS Take 1 capsule (1,000 Units total) by mouth daily.  Marland Kitchen doxycycline (VIBRA-TABS) 100 MG tablet Take 1 tablet (100 mg total) by mouth 2 (two) times daily. 1 po bid  . furosemide (LASIX) 20  MG tablet Take 1 tablet (20 mg total) by mouth daily.  Marland Kitchen levothyroxine (SYNTHROID) 50 MCG tablet TAKE ONE TABLET BY MOUTH ONCE DAILY BEFORE BREAKFAST  . meloxicam (MOBIC) 15 MG tablet Take 1 tablet (15 mg total) by mouth daily.  . metoprolol tartrate (LOPRESSOR) 50 MG tablet Take 1 tablet (50 mg total) by mouth 2 (two) times daily.  . simvastatin (ZOCOR) 20 MG tablet Take 1 tablet (20 mg total) by mouth at bedtime.  . triamcinolone cream (KENALOG) 0.1 % APPLY TO AFFECTED AREA TWICE DAILY     Past Surgical History:  Procedure Laterality Date  . APPENDECTOMY    . TONSILLECTOMY      Family History  Problem Relation Age of Onset  . Heart disease Father     New complaints: Had bug bite on right lower leg and is now red and hot to touch.  Social history: Lives with her husband  Controlled substance contract: n/a     Review of Systems  Constitutional: Negative for diaphoresis.  Eyes: Negative for pain.  Respiratory: Negative for shortness of breath.   Cardiovascular: Negative for chest pain, palpitations and leg swelling.  Gastrointestinal: Negative for abdominal pain.  Endocrine: Negative for polydipsia.  Skin: Negative for rash.       Erythema of right lower leg  Neurological: Negative for dizziness, weakness and headaches.  Hematological: Does not bruise/bleed easily.  All other systems reviewed and are negative.      Objective:   Physical Exam Vitals and nursing note reviewed.  Constitutional:      General: She is not in acute distress.    Appearance: Normal appearance. She is well-developed.  HENT:     Head: Normocephalic.     Nose: Nose normal.  Eyes:     Pupils: Pupils are equal, round, and reactive to light.  Neck:     Vascular: No carotid bruit or JVD.  Cardiovascular:     Rate and Rhythm: Normal rate and regular rhythm.     Heart sounds: Normal heart sounds.  Pulmonary:     Effort: Pulmonary effort is normal. No respiratory distress.     Breath  sounds: Normal breath sounds. No wheezing or rales.  Chest:     Chest wall: No tenderness.  Abdominal:     General: Bowel sounds are normal. There is no distension or abdominal bruit.     Palpations: Abdomen is soft. There is no hepatomegaly, splenomegaly, mass or pulsatile mass.     Tenderness: There is no abdominal tenderness.  Musculoskeletal:        General: Normal range of motion.     Cervical back: Normal range of motion and neck supple.  Lymphadenopathy:     Cervical: No cervical adenopathy.  Skin:    General: Skin is warm and dry.     Comments: Entire right shin erythematous and hot to touch  Neurological:     Mental Status: She is alert and oriented to person, place, and time.     Deep Tendon Reflexes: Reflexes are normal and symmetric.  Psychiatric:        Behavior: Behavior normal.        Thought Content: Thought content normal.        Judgment: Judgment normal.    BP (!) 142/71   Pulse 69   Temp 97.6 F (36.4 C) (Temporal)   Resp 20   Ht 5\' 3"  (1.6 m)   Wt 156 lb (70.8 kg)   SpO2 98%   BMI 27.63 kg/m         Assessment & Plan:  Ellisa Devivo comes in today with chief complaint of Medical Management of Chronic Issues   Diagnosis and orders addressed:  1. Essential hypertension lowsodium diet - metoprolol tartrate (LOPRESSOR) 50 MG tablet; Take 1 tablet (50 mg total) by mouth 2 (two) times daily.  Dispense: 180 tablet; Refill: 1 - furosemide (LASIX) 20 MG tablet; Take 1 tablet (20 mg total) by mouth daily.  Dispense: 90 tablet; Refill: 1 - amLODipine (NORVASC) 10 MG tablet; Take 1 tablet (10 mg total) by mouth daily.  Dispense: 90 tablet; Refill: 1  2. Mixed hyperlipidemia Low fat diet - simvastatin (ZOCOR) 20 MG tablet; Take 1 tablet (20 mg total) by mouth at bedtime.  Dispense: 90 tablet; Refill: 1  3. Acquired hypothyroidism - levothyroxine (SYNTHROID) 50 MCG tablet; TAKE ONE TABLET BY MOUTH ONCE DAILY BEFORE BREAKFAST  Dispense: 90 tablet; Refill:  1  4. Stage 3a chronic kidney disease  5. Age-related osteoporosis without current pathological fracture Weight bearing exercises  6. BMI 28.0-28.9,adult Discussed diet and exercise for person with BMI >25 Will recheck weight in 3-6 months  7. Cellulitis of right lower extremity Watch for worsening infection - cephALEXin (KEFLEX) 500 MG capsule; Take 1 capsule (500 mg total) by mouth 3 (  three) times daily.  Dispense: 30 capsule; Refill: 0   Labs pending Health Maintenance reviewed Diet and exercise encouraged  Follow up plan: 3 months   [name] Done, FNP

## 2019-08-24 NOTE — Patient Instructions (Signed)

## 2019-08-25 LAB — CBC WITH DIFFERENTIAL/PLATELET
Basophils Absolute: 0.1 10*3/uL (ref 0.0–0.2)
Basos: 1 %
EOS (ABSOLUTE): 0.2 10*3/uL (ref 0.0–0.4)
Eos: 3 %
Hematocrit: 36.1 % (ref 34.0–46.6)
Hemoglobin: 12.1 g/dL (ref 11.1–15.9)
Immature Grans (Abs): 0 10*3/uL (ref 0.0–0.1)
Immature Granulocytes: 0 %
Lymphocytes Absolute: 2 10*3/uL (ref 0.7–3.1)
Lymphs: 30 %
MCH: 32.3 pg (ref 26.6–33.0)
MCHC: 33.5 g/dL (ref 31.5–35.7)
MCV: 96 fL (ref 79–97)
Monocytes Absolute: 0.7 10*3/uL (ref 0.1–0.9)
Monocytes: 11 %
Neutrophils Absolute: 3.7 10*3/uL (ref 1.4–7.0)
Neutrophils: 55 %
Platelets: 162 10*3/uL (ref 150–450)
RBC: 3.75 x10E6/uL — ABNORMAL LOW (ref 3.77–5.28)
RDW: 12.8 % (ref 11.7–15.4)
WBC: 6.7 10*3/uL (ref 3.4–10.8)

## 2019-08-25 LAB — CMP14+EGFR
ALT: 10 IU/L (ref 0–32)
AST: 21 IU/L (ref 0–40)
Albumin/Globulin Ratio: 1.7 (ref 1.2–2.2)
Albumin: 4.5 g/dL (ref 3.6–4.6)
Alkaline Phosphatase: 71 IU/L (ref 48–121)
BUN/Creatinine Ratio: 19 (ref 12–28)
BUN: 23 mg/dL (ref 8–27)
Bilirubin Total: 0.5 mg/dL (ref 0.0–1.2)
CO2: 25 mmol/L (ref 20–29)
Calcium: 9.6 mg/dL (ref 8.7–10.3)
Chloride: 101 mmol/L (ref 96–106)
Creatinine, Ser: 1.19 mg/dL — ABNORMAL HIGH (ref 0.57–1.00)
GFR calc Af Amer: 48 mL/min/{1.73_m2} — ABNORMAL LOW (ref >59)
GFR calc non Af Amer: 42 mL/min/{1.73_m2} — ABNORMAL LOW (ref >59)
Globulin, Total: 2.7 g/dL (ref 1.5–4.5)
Glucose: 90 mg/dL (ref 65–99)
Potassium: 4.4 mmol/L (ref 3.5–5.2)
Sodium: 139 mmol/L (ref 134–144)
Total Protein: 7.2 g/dL (ref 6.0–8.5)

## 2019-08-25 LAB — LIPID PANEL
Chol/HDL Ratio: 2.2 ratio (ref 0.0–4.4)
Cholesterol, Total: 142 mg/dL (ref 100–199)
HDL: 66 mg/dL (ref >39)
LDL Chol Calc (NIH): 56 mg/dL (ref 0–99)
Triglycerides: 110 mg/dL (ref 0–149)
VLDL Cholesterol Cal: 20 mg/dL (ref 5–40)

## 2019-08-25 LAB — THYROID PANEL WITH TSH
Free Thyroxine Index: 1.9 (ref 1.2–4.9)
T3 Uptake Ratio: 27 % (ref 24–39)
T4, Total: 7 ug/dL (ref 4.5–12.0)
TSH: 5.06 u[IU]/mL — ABNORMAL HIGH (ref 0.450–4.500)

## 2019-08-29 MED ORDER — LEVOTHYROXINE SODIUM 75 MCG PO TABS
75.0000 ug | ORAL_TABLET | Freq: Every day | ORAL | 3 refills | Status: DC
Start: 2019-08-29 — End: 2019-11-27

## 2019-08-29 NOTE — Addendum Note (Signed)
Addended by: [name] on: 08/29/2019 04:25 PM   Modules accepted: Orders

## 2019-11-20 IMAGING — DX DG CHEST 2V
2 series · 2 of 2 positions shown · non-contrast
Comparison: 08/13/2014

CLINICAL DATA: Essential hypertension.

EXAM:
CHEST  2 VIEW

[chest pa]
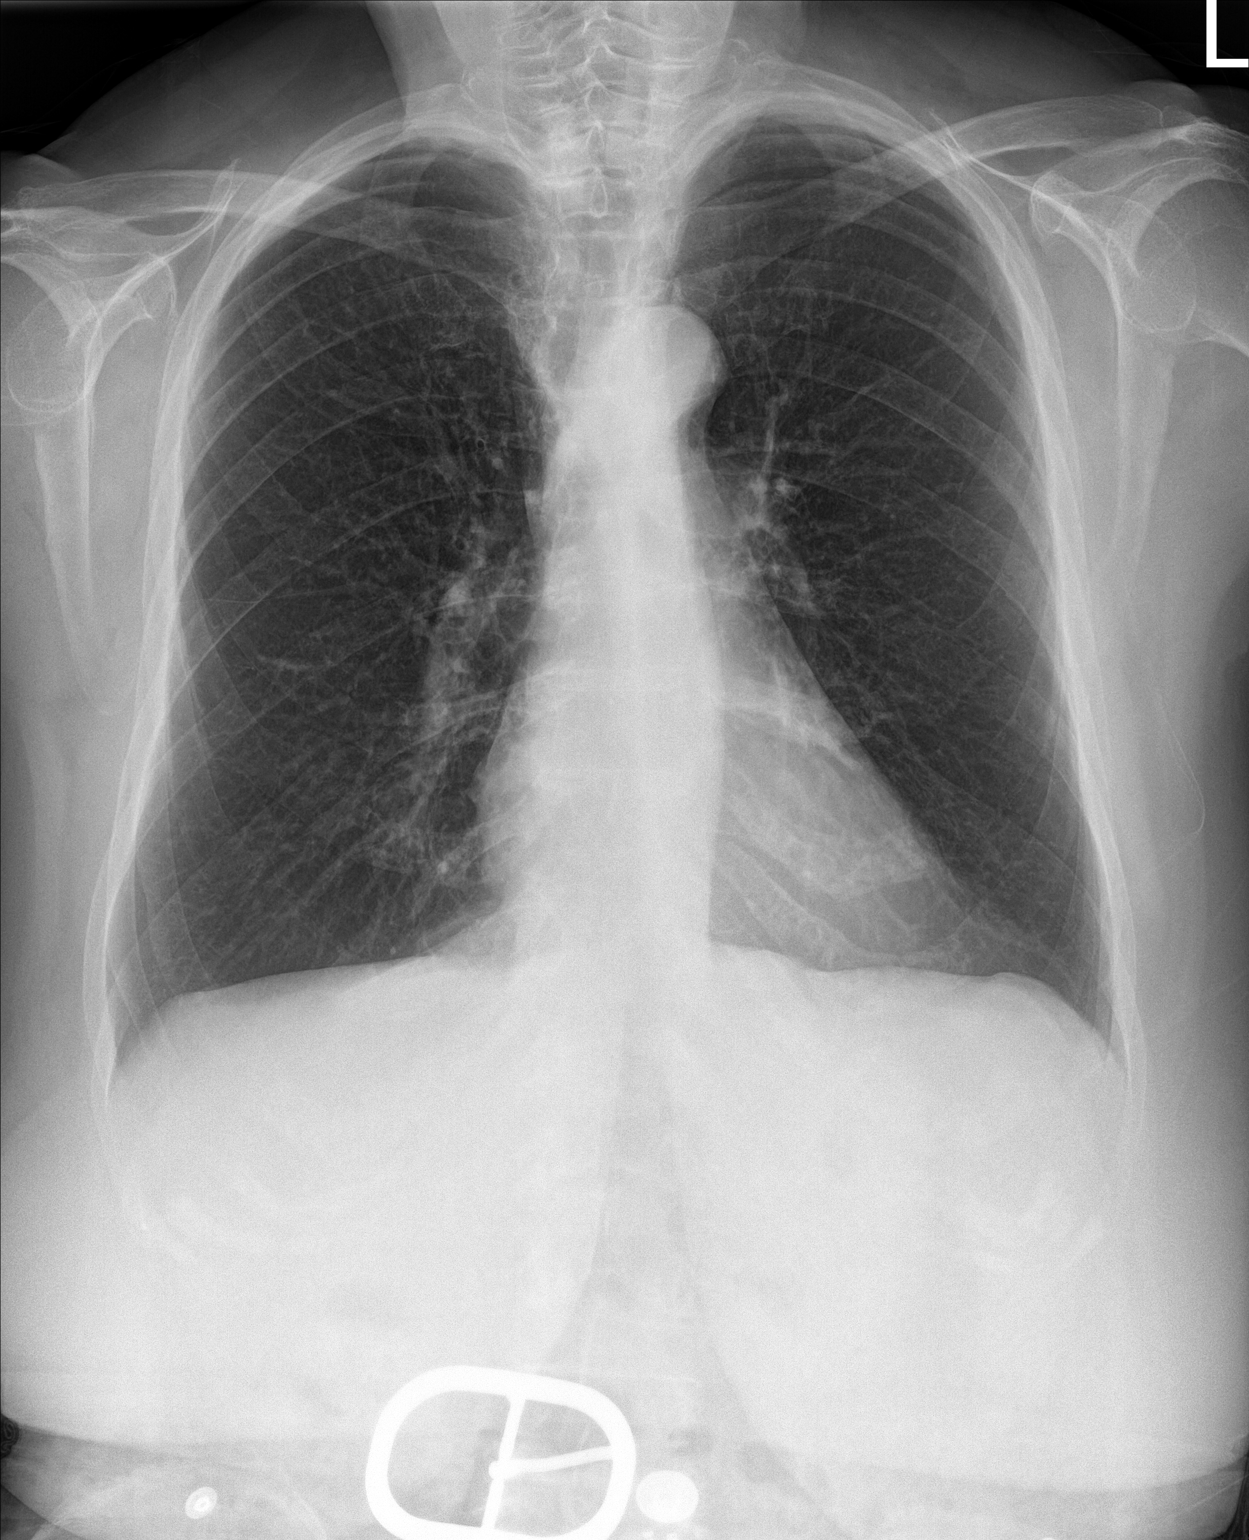

[chest lat]
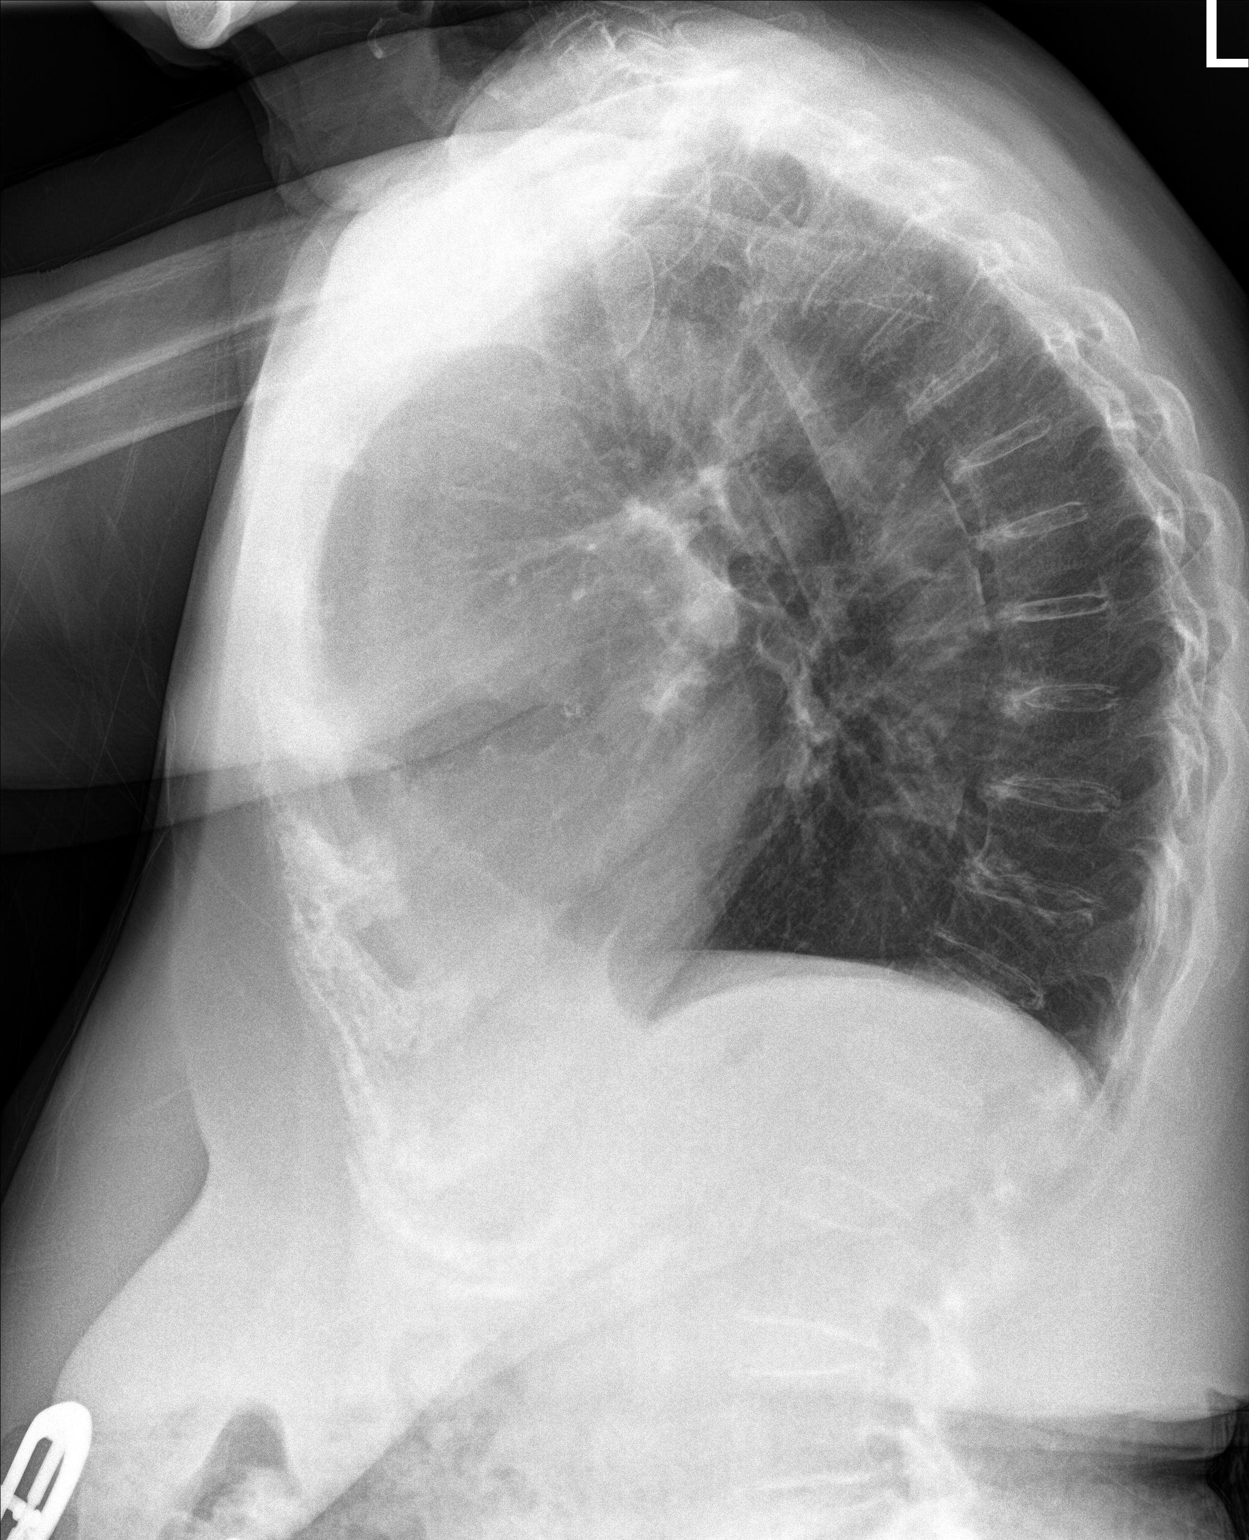

[2 of 2 positions shown; findings below may reference images not displayed]

FINDINGS: Cardiomediastinal silhouette is normal. Mediastinal contours appear
intact. Tortuosity and calcific atherosclerotic disease of the
thoracic aorta.

There is no evidence of focal airspace consolidation, pleural
effusion or pneumothorax. Chronic hyperinflation of the lungs

Stable mild chronic compression fracture of 1 of the lower thoracic
vertebral bodies, with associated kyphosis. Soft tissues are grossly
normal.
IMPRESSION: No active cardiopulmonary disease.

Chronic hyperinflation of the lungs.

Chronic mild compression fracture of 1 of the lower thoracic
vertebral bodies.

## 2019-11-25 ENCOUNTER — Other Ambulatory Visit: Payer: Self-pay | Admitting: Nurse Practitioner

## 2019-11-25 DIAGNOSIS — E039 Hypothyroidism, unspecified: Secondary | ICD-10-CM

## 2019-11-27 ENCOUNTER — Encounter: Payer: Self-pay | Admitting: Nurse Practitioner

## 2019-11-27 ENCOUNTER — Other Ambulatory Visit: Payer: Self-pay

## 2019-11-27 ENCOUNTER — Ambulatory Visit (INDEPENDENT_AMBULATORY_CARE_PROVIDER_SITE_OTHER): Payer: Medicare HMO | Admitting: Nurse Practitioner

## 2019-11-27 VITALS — BP 144/66 | HR 64 | Temp 97.2°F | Resp 20 | Ht 63.0 in | Wt 154.0 lb

## 2019-11-27 DIAGNOSIS — E039 Hypothyroidism, unspecified: Secondary | ICD-10-CM

## 2019-11-27 DIAGNOSIS — I10 Essential (primary) hypertension: Secondary | ICD-10-CM

## 2019-11-27 DIAGNOSIS — Z23 Encounter for immunization: Secondary | ICD-10-CM

## 2019-11-27 DIAGNOSIS — N1831 Chronic kidney disease, stage 3a: Secondary | ICD-10-CM

## 2019-11-27 DIAGNOSIS — E782 Mixed hyperlipidemia: Secondary | ICD-10-CM | POA: Diagnosis not present

## 2019-11-27 DIAGNOSIS — Z6828 Body mass index (BMI) 28.0-28.9, adult: Secondary | ICD-10-CM | POA: Diagnosis not present

## 2019-11-27 MED ORDER — SIMVASTATIN 20 MG PO TABS: 20.0000 mg | ORAL_TABLET | Freq: Every day | ORAL | 1 refills | Status: DC

## 2019-11-27 MED ORDER — METOPROLOL TARTRATE 50 MG PO TABS: 50.0000 mg | ORAL_TABLET | Freq: Two times a day (BID) | ORAL | 1 refills | Status: DC

## 2019-11-27 MED ORDER — FUROSEMIDE 20 MG PO TABS: 20.0000 mg | ORAL_TABLET | Freq: Every day | ORAL | 1 refills | Status: DC

## 2019-11-27 MED ORDER — LEVOTHYROXINE SODIUM 75 MCG PO TABS: 75.0000 ug | ORAL_TABLET | Freq: Every day | ORAL | 3 refills | Status: DC

## 2019-11-27 MED ORDER — AMLODIPINE BESYLATE 10 MG PO TABS: 10.0000 mg | ORAL_TABLET | Freq: Every day | ORAL | 1 refills | Status: DC

## 2019-11-27 NOTE — Patient Instructions (Signed)

## 2019-11-27 NOTE — Progress Notes (Signed)
Subjective:    Patient ID: Audrey Manning, female    DOB: September 13, 1932, 86 y.o.   MRN: [id number]   Chief Complaint: Medical Management of Chronic Issues    HPI:  1. Primary hypertension No c/o chest pain, sob or headache. doesnotcheck blood pressure at home. BP Readings from Last 3 Encounters:  11/27/19 (!) 144/66  08/24/19 (!) 142/71  07/28/19 129/61     2. Acquired hypothyroidism No problems that she is aware of.  3. Mixed hyperlipidemia Does not really watch diet and does little to no exercise. Lab Results  Component Value Date   CHOL 142 08/24/2019   HDL 66 08/24/2019   LDLCALC 56 08/24/2019   TRIG 110 08/24/2019   CHOLHDL 2.2 08/24/2019     4. Stage 3a chronic kidney disease (HCC) No problems voiding Lab Results  Component Value Date   CREATININE 1.19 (H) 08/24/2019     5. BMI 28.0-28.9,adult No recent weight changes Wt Readings from Last 3 Encounters:  11/27/19 154 lb (69.9 kg)  08/24/19 156 lb (70.8 kg)  07/28/19 157 lb 2 oz (71.3 kg)   BMI Readings from Last 3 Encounters:  11/27/19 27.28 kg/m  08/24/19 27.63 kg/m  07/28/19 27.83 kg/m       Outpatient Encounter Medications as of 11/27/2019  Medication Sig  . amLODipine (NORVASC) 10 MG tablet Take 1 tablet (10 mg total) by mouth daily.  . Calcium Carbonate-Vitamin D (CALCIUM 500 + D) 500-125 MG-UNIT TABS Take 500 mg by mouth daily.  . Cholecalciferol (VITAMIN D3) 1000 units CAPS Take 1 capsule (1,000 Units total) by mouth daily.  . furosemide (LASIX) 20 MG tablet Take 1 tablet (20 mg total) by mouth daily.  Marland Kitchen levothyroxine (SYNTHROID) 75 MCG tablet Take 1 tablet (75 mcg total) by mouth daily.  . meloxicam (MOBIC) 15 MG tablet Take 1 tablet (15 mg total) by mouth daily.  . metoprolol tartrate (LOPRESSOR) 50 MG tablet Take 1 tablet (50 mg total) by mouth 2 (two) times daily.  . simvastatin (ZOCOR) 20 MG tablet Take 1 tablet (20 mg total) by mouth at bedtime.  . triamcinolone cream (KENALOG)  0.1 % APPLY TO AFFECTED AREA TWICE DAILY     Past Surgical History:  Procedure Laterality Date  . APPENDECTOMY    . TONSILLECTOMY      Family History  Problem Relation Age of Onset  . Heart disease Father     New complaints: None today  Social history: Lives by herself since her husband passed away. Her daughter does stay with her most nights.  Controlled substance contract: n/a    Review of Systems  Constitutional: Negative for diaphoresis.  Eyes: Negative for pain.  Respiratory: Negative for shortness of breath.   Cardiovascular: Negative for chest pain, palpitations and leg swelling.  Gastrointestinal: Negative for abdominal pain.  Endocrine: Negative for polydipsia.  Skin: Negative for rash.  Neurological: Negative for dizziness, weakness and headaches.  Hematological: Does not bruise/bleed easily.  All other systems reviewed and are negative.      Objective:   Physical Exam Vitals and nursing note reviewed.  Constitutional:      General: She is not in acute distress.    Appearance: Normal appearance. She is well-developed.  HENT:     Head: Normocephalic.     Nose: Nose normal.  Eyes:     Pupils: Pupils are equal, round, and reactive to light.  Neck:     Vascular: No carotid bruit or JVD.  Cardiovascular:  Rate and Rhythm: Normal rate and regular rhythm.     Heart sounds: Normal heart sounds.  Pulmonary:     Effort: Pulmonary effort is normal. No respiratory distress.     Breath sounds: Normal breath sounds. No wheezing or rales.  Chest:     Chest wall: No tenderness.  Abdominal:     General: Bowel sounds are normal. There is no distension or abdominal bruit.     Palpations: Abdomen is soft. There is no hepatomegaly, splenomegaly, mass or pulsatile mass.     Tenderness: There is no abdominal tenderness.  Musculoskeletal:        General: Normal range of motion.     Cervical back: Normal range of motion and neck supple.  Lymphadenopathy:      Cervical: No cervical adenopathy.  Skin:    General: Skin is warm and dry.  Neurological:     Mental Status: She is alert and oriented to person, place, and time.     Deep Tendon Reflexes: Reflexes are normal and symmetric.  Psychiatric:        Behavior: Behavior normal.        Thought Content: Thought content normal.        Judgment: Judgment normal.     BP (!) 144/66   Pulse 64   Temp (!) 97.2 F (36.2 C) (Temporal)   Resp 20   Ht 5\' 3"  (1.6 m)   Wt 154 lb (69.9 kg)   SpO2 97%   BMI 27.28 kg/m        Assessment & Plan:  Audrey Manning comes in today with chief complaint of Medical Management of Chronic Issues   Diagnosis and orders addressed:  1. Essential hypertension Low sodium diet - metoprolol tartrate (LOPRESSOR) 50 MG tablet; Take 1 tablet (50 mg total) by mouth 2 (two) times daily.  Dispense: 180 tablet; Refill: 1 - furosemide (LASIX) 20 MG tablet; Take 1 tablet (20 mg total) by mouth daily.  Dispense: 90 tablet; Refill: 1 - amLODipine (NORVASC) 10 MG tablet; Take 1 tablet (10 mg total) by mouth daily.  Dispense: 90 tablet; Refill: 1   2. Acquired hypothyroidism - levothyroxine (SYNTHROID) 75 MCG tablet; Take 1 tablet (75 mcg total) by mouth daily.  Dispense: 90 tablet; Refill: 3  3. Mixed hyperlipidemia Low fat diet - simvastatin (ZOCOR) 20 MG tablet; Take 1 tablet (20 mg total) by mouth at bedtime.  Dispense: 90 tablet; Refill: 1  4. Stage 3a chronic kidney disease ([place]) Labs pending  5. BMI 28.0-28.9,adult Discussed diet and exercise for person with BMI >25 Will recheck weight in 3-6 months   Labs pending Health Maintenance reviewed Diet and exercise encouraged  Follow up plan: 3 months   [name] Done, FNP

## 2019-11-28 LAB — CMP14+EGFR
ALT: 10 IU/L (ref 0–32)
AST: 24 IU/L (ref 0–40)
Albumin/Globulin Ratio: 1.7 (ref 1.2–2.2)
Albumin: 4.6 g/dL (ref 3.6–4.6)
Alkaline Phosphatase: 79 IU/L (ref 44–121)
BUN/Creatinine Ratio: 21 (ref 12–28)
BUN: 28 mg/dL — ABNORMAL HIGH (ref 8–27)
Bilirubin Total: 0.6 mg/dL (ref 0.0–1.2)
CO2: 23 mmol/L (ref 20–29)
Calcium: 9.2 mg/dL (ref 8.7–10.3)
Chloride: 102 mmol/L (ref 96–106)
Creatinine, Ser: 1.36 mg/dL — ABNORMAL HIGH (ref 0.57–1.00)
GFR calc Af Amer: 41 mL/min/{1.73_m2} — ABNORMAL LOW (ref >59)
GFR calc non Af Amer: 36 mL/min/{1.73_m2} — ABNORMAL LOW (ref >59)
Globulin, Total: 2.7 g/dL (ref 1.5–4.5)
Glucose: 96 mg/dL (ref 65–99)
Potassium: 4.2 mmol/L (ref 3.5–5.2)
Sodium: 141 mmol/L (ref 134–144)
Total Protein: 7.3 g/dL (ref 6.0–8.5)

## 2019-11-28 LAB — CBC WITH DIFFERENTIAL/PLATELET
Basophils Absolute: 0 10*3/uL (ref 0.0–0.2)
Basos: 1 %
EOS (ABSOLUTE): 0.1 10*3/uL (ref 0.0–0.4)
Eos: 1 %
Hematocrit: 35.7 % (ref 34.0–46.6)
Hemoglobin: 11.9 g/dL (ref 11.1–15.9)
Immature Grans (Abs): 0 10*3/uL (ref 0.0–0.1)
Immature Granulocytes: 0 %
Lymphocytes Absolute: 1.7 10*3/uL (ref 0.7–3.1)
Lymphs: 34 %
MCH: 32.3 pg (ref 26.6–33.0)
MCHC: 33.3 g/dL (ref 31.5–35.7)
MCV: 97 fL (ref 79–97)
Monocytes Absolute: 0.6 10*3/uL (ref 0.1–0.9)
Monocytes: 11 %
Neutrophils Absolute: 2.8 10*3/uL (ref 1.4–7.0)
Neutrophils: 53 %
Platelets: 170 10*3/uL (ref 150–450)
RBC: 3.68 x10E6/uL — ABNORMAL LOW (ref 3.77–5.28)
RDW: 11.9 % (ref 11.7–15.4)
WBC: 5.2 10*3/uL (ref 3.4–10.8)

## 2019-11-28 LAB — LIPID PANEL
Chol/HDL Ratio: 2.1 ratio (ref 0.0–4.4)
Cholesterol, Total: 146 mg/dL (ref 100–199)
HDL: 69 mg/dL (ref >39)
LDL Chol Calc (NIH): 64 mg/dL (ref 0–99)
Triglycerides: 63 mg/dL (ref 0–149)
VLDL Cholesterol Cal: 13 mg/dL (ref 5–40)

## 2019-11-28 LAB — THYROID PANEL WITH TSH
Free Thyroxine Index: 2.8 (ref 1.2–4.9)
T3 Uptake Ratio: 29 % (ref 24–39)
T4, Total: 9.6 ug/dL (ref 4.5–12.0)
TSH: 0.59 u[IU]/mL (ref 0.450–4.500)

## 2020-01-31 ENCOUNTER — Ambulatory Visit (INDEPENDENT_AMBULATORY_CARE_PROVIDER_SITE_OTHER): Payer: Medicare HMO | Admitting: *Deleted

## 2020-01-31 DIAGNOSIS — Z0000 Encounter for general adult medical examination without abnormal findings: Secondary | ICD-10-CM | POA: Diagnosis not present
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
## 2020-01-31 NOTE — Patient Instructions (Signed)
  [place] Maintenance Summary and Written Plan of Care  Ms. Dupee ,  Thank you for allowing me to perform your Medicare Annual Wellness Visit and for your ongoing commitment to your health.   Health Maintenance & Immunization History Health Maintenance  Topic Date Due  . COVID-19 Vaccine (1) Never done  . TETANUS/TDAP  11/23/2020  . INFLUENZA VACCINE  Completed  . DEXA SCAN  Completed  . PNA vac Low Risk Adult  Completed  . MAMMOGRAM  Discontinued   Immunization History  Administered Date(s) Administered  . Fluad Quad(high Dose 65+) 11/29/2018, 11/27/2019  . Influenza Whole 12/12/2009  . Influenza, High Dose Seasonal PF 11/25/2015, 11/30/2016, 11/18/2017  . Influenza,inj,Quad PF,6+ Mos 01/03/2013, 01/13/2014, 11/23/2014  . Pneumococcal Conjugate-13 06/14/2014  . Pneumococcal Polysaccharide-23 12/13/2007  . Td 11/24/2010  . Tdap 11/24/2010    These are the patient goals that we discussed: Goals Addressed            This Visit's Progress   . awv       01/31/2020 AWV Goal: Fall Prevention  . Over the next year, patient will decrease their risk for falls by: o Using assistive devices, such as a cane or walker, as needed o Identifying fall risks within their home and correcting them by: - Removing throw rugs - Adding handrails to stairs or ramps - Removing clutter and keeping a clear pathway throughout the home - Increasing light, especially at night - Adding shower handles/bars - Raising toilet seat o Identifying potential personal risk factors for falls: - Medication side effects - Incontinence/urgency - Vestibular dysfunction - Hearing loss - Musculoskeletal disorders - Neurological disorders - Orthostatic hypotension          This is a list of Health Maintenance Items that are overdue or due now: Health Maintenance Due  Topic Date Due  . COVID-19 Vaccine (1) Never done     Orders/Referrals Placed Today: No orders of  the defined types were placed in this encounter.  (Contact our referral department at [phone] if you have not spoken with someone about your referral appointment within the next 5 days)    Follow-up Plan . Follow-up with [name], FNP as planned . We will discuss shingles vaccine at your next visit . AVS printed and mailed to the patient.

## 2020-02-16 ENCOUNTER — Other Ambulatory Visit: Payer: Self-pay | Admitting: Nurse Practitioner

## 2020-02-16 DIAGNOSIS — I10 Essential (primary) hypertension: Secondary | ICD-10-CM

## 2020-02-27 ENCOUNTER — Encounter: Payer: Self-pay | Admitting: Nurse Practitioner

## 2020-02-27 ENCOUNTER — Other Ambulatory Visit: Payer: Self-pay

## 2020-02-27 ENCOUNTER — Ambulatory Visit (INDEPENDENT_AMBULATORY_CARE_PROVIDER_SITE_OTHER): Payer: Medicare HMO | Admitting: Nurse Practitioner

## 2020-02-27 VITALS — BP 138/80 | HR 65 | Temp 97.3°F | Resp 20 | Ht 63.0 in | Wt 157.0 lb

## 2020-02-27 DIAGNOSIS — E782 Mixed hyperlipidemia: Secondary | ICD-10-CM | POA: Diagnosis not present

## 2020-02-27 DIAGNOSIS — I10 Essential (primary) hypertension: Secondary | ICD-10-CM | POA: Diagnosis not present

## 2020-02-27 DIAGNOSIS — N1831 Chronic kidney disease, stage 3a: Secondary | ICD-10-CM

## 2020-02-27 DIAGNOSIS — M810 Age-related osteoporosis without current pathological fracture: Secondary | ICD-10-CM | POA: Diagnosis not present

## 2020-02-27 DIAGNOSIS — Z6828 Body mass index (BMI) 28.0-28.9, adult: Secondary | ICD-10-CM

## 2020-02-27 DIAGNOSIS — E039 Hypothyroidism, unspecified: Secondary | ICD-10-CM | POA: Diagnosis not present

## 2020-02-27 MED ORDER — METOPROLOL TARTRATE 50 MG PO TABS: 50.0000 mg | ORAL_TABLET | Freq: Two times a day (BID) | ORAL | 1 refills | Status: DC

## 2020-02-27 MED ORDER — FUROSEMIDE 20 MG PO TABS: 20.0000 mg | ORAL_TABLET | Freq: Every day | ORAL | 1 refills | Status: DC

## 2020-02-27 MED ORDER — AMLODIPINE BESYLATE 10 MG PO TABS: 10.0000 mg | ORAL_TABLET | Freq: Every day | ORAL | 1 refills | Status: DC

## 2020-02-27 MED ORDER — LEVOTHYROXINE SODIUM 75 MCG PO TABS: 75.0000 ug | ORAL_TABLET | Freq: Every day | ORAL | 1 refills | Status: DC

## 2020-02-27 MED ORDER — SIMVASTATIN 20 MG PO TABS: 20.0000 mg | ORAL_TABLET | Freq: Every day | ORAL | 1 refills | Status: DC

## 2020-02-27 NOTE — Progress Notes (Signed)
Subjective:    Patient ID: Audrey Manning, female    DOB: 04/22/1932, 87 y.o.   MRN: [id number]   Chief Complaint: Medical Management of Chronic Issues    HPI:  1. Primary hypertension No c/o chest pain, sob, or headache. She does  check blood pressure at home and it usually runs in 102'H systolic at home. BP Readings from Last 3 Encounters:  02/27/20 (!) 152/71  11/27/19 (!) 144/66  08/24/19 (!) 142/71     2. Mixed hyperlipidemia Does not watch diet and does very little exercise. Lab Results  Component Value Date   CHOL 146 11/27/2019   HDL 69 11/27/2019   LDLCALC 64 11/27/2019   TRIG 63 11/27/2019   CHOLHDL 2.1 11/27/2019     3. Acquired hypothyroidism Is having issues that aware of. Lab Results  Component Value Date   TSH 0.590 11/27/2019     4. Age-related osteoporosis without current pathological fracture She does very little weight bearing exercises. Last dexascan was done 11/30/16. Will repeat today  5. Stage 3a chronic kidney disease (HCC) No problems voiding Lab Results  Component Value Date   CREATININE 1.36 (H) 11/27/2019     6. BMI 28.0-28.9,adult No recent weight changes Wt Readings from Last 3 Encounters:  02/27/20 157 lb (71.2 kg)  11/27/19 154 lb (69.9 kg)  08/24/19 156 lb (70.8 kg)   BMI Readings from Last 3 Encounters:  02/27/20 27.81 kg/m  11/27/19 27.28 kg/m  08/24/19 27.63 kg/m       Outpatient Encounter Medications as of 02/27/2020  Medication Sig  . amLODipine (NORVASC) 10 MG tablet Take 1 tablet (10 mg total) by mouth daily.  . Cholecalciferol (VITAMIN D3) 1000 units CAPS Take 1 capsule (1,000 Units total) by mouth daily.  . furosemide (LASIX) 20 MG tablet Take 1 tablet (20 mg total) by mouth daily.  Marland Kitchen levothyroxine (SYNTHROID) 75 MCG tablet Take 1 tablet (75 mcg total) by mouth daily.  . metoprolol tartrate (LOPRESSOR) 50 MG tablet Take 1 tablet by mouth twice daily  . simvastatin (ZOCOR) 20 MG tablet Take 1 tablet  (20 mg total) by mouth at bedtime.  . triamcinolone cream (KENALOG) 0.1 % APPLY TO AFFECTED AREA TWICE DAILY     Past Surgical History:  Procedure Laterality Date  . APPENDECTOMY    . TONSILLECTOMY      Family History  Problem Relation Age of Onset  . Heart disease Father     New complaints: None today  Social history: Her daughter still lives with her.  Controlled substance contract: n/a    Review of Systems  Constitutional: Negative for diaphoresis.  Eyes: Negative for pain.  Respiratory: Negative for shortness of breath.   Cardiovascular: Negative for chest pain, palpitations and leg swelling.  Gastrointestinal: Negative for abdominal pain.  Endocrine: Negative for polydipsia.  Skin: Negative for rash.  Neurological: Negative for dizziness, weakness and headaches.  Hematological: Does not bruise/bleed easily.  All other systems reviewed and are negative.      Objective:   Physical Exam Vitals and nursing note reviewed.  Constitutional:      General: She is not in acute distress.    Appearance: Normal appearance. She is well-developed and well-nourished.  HENT:     Head: Normocephalic.     Nose: Nose normal.     Mouth/Throat:     Mouth: Oropharynx is clear and moist.  Eyes:     Extraocular Movements: EOM normal.     Pupils: Pupils are equal,  round, and reactive to light.  Neck:     Vascular: No carotid bruit or JVD.  Cardiovascular:     Rate and Rhythm: Normal rate and regular rhythm.     Pulses: Intact distal pulses.     Heart sounds: Normal heart sounds.  Pulmonary:     Effort: Pulmonary effort is normal. No respiratory distress.     Breath sounds: Normal breath sounds. No wheezing or rales.  Chest:     Chest wall: No tenderness.  Abdominal:     General: Bowel sounds are normal. There is no distension or abdominal bruit. Aorta is normal.     Palpations: Abdomen is soft. There is no hepatomegaly, splenomegaly, mass or pulsatile mass.      Tenderness: There is no abdominal tenderness.  Musculoskeletal:        General: No edema. Normal range of motion.     Cervical back: Normal range of motion and neck supple.  Lymphadenopathy:     Cervical: No cervical adenopathy.  Skin:    General: Skin is warm and dry.  Neurological:     Mental Status: She is alert and oriented to person, place, and time.     Deep Tendon Reflexes: Reflexes are normal and symmetric.  Psychiatric:        Mood and Affect: Mood and affect normal.        Behavior: Behavior normal.        Thought Content: Thought content normal.        Judgment: Judgment normal.    BP 138/80   Pulse 65   Temp (!) 97.3 F (36.3 C) (Temporal)   Resp 20   Ht 5' 3"  (1.6 m)   Wt 157 lb (71.2 kg)   SpO2 98%   BMI 27.81 kg/m        Assessment & Plan:  Audrey Manning comes in today with chief complaint of Medical Management of Chronic Issues   Diagnosis and orders addressed:  1. Primary hypertension Low sodium diet - CBC with Differential/Platelet - CMP14+EGFR - metoprolol tartrate (LOPRESSOR) 50 MG tablet; Take 1 tablet (50 mg total) by mouth 2 (two) times daily.  Dispense: 180 tablet; Refill: 1 - furosemide (LASIX) 20 MG tablet; Take 1 tablet (20 mg total) by mouth daily.  Dispense: 90 tablet; Refill: 1 - amLODipine (NORVASC) 10 MG tablet; Take 1 tablet (10 mg total) by mouth daily.  Dispense: 90 tablet; Refill: 1  2. Mixed hyperlipidemia Low fat diet - simvastatin (ZOCOR) 20 MG tablet; Take 1 tablet (20 mg total) by mouth at bedtime.  Dispense: 90 tablet; Refill: 1 - Lipid panel  3. Acquired hypothyroidism - levothyroxine (SYNTHROID) 75 MCG tablet; Take 1 tablet (75 mcg total) by mouth daily.  Dispense: 90 tablet; Refill: 1 - Thyroid Panel With TSH  4. Age-related osteoporosis without current pathological fracture dexascan repeated today  5. Stage 3a chronic kidney disease ([place]) Labs pending  6. BMI 28.0-28.9,adult Discussed diet and exercise for  person with BMI >25 Will recheck weight in 3-6 months    Labs pending Health Maintenance reviewed Diet and exercise encouraged  Follow up plan: 6 months   [name] Done, FNP

## 2020-02-27 NOTE — Patient Instructions (Signed)
Exercising to Stay Healthy To become healthy and stay healthy, it is recommended that you do moderate-intensity and vigorous-intensity exercise. You can tell that you are exercising at a moderate intensity if your heart starts beating faster and you start breathing faster but can still hold a conversation. You can tell that you are exercising at a vigorous intensity if you are breathing much harder and faster and cannot hold a conversation while exercising. Exercising regularly is important. It has many health benefits, such as:  Improving overall fitness, flexibility, and endurance.  Increasing bone density.  Helping with weight control.  Decreasing body fat.  Increasing muscle strength.  Reducing stress and tension.  Improving overall health. How often should I exercise? Choose an activity that you enjoy, and set realistic goals. Your health care provider can help you make an activity plan that works for you. Exercise regularly as told by your health care provider. This may include:  Doing strength training two times a week, such as: ? Lifting weights. ? Using resistance bands. ? Push-ups. ? Sit-ups. ? Yoga.  Doing a certain intensity of exercise for a given amount of time. Choose from these options: ? A total of 150 minutes of moderate-intensity exercise every week. ? A total of 75 minutes of vigorous-intensity exercise every week. ? A mix of moderate-intensity and vigorous-intensity exercise every week. Children, pregnant women, people who have not exercised regularly, people who are overweight, and older adults may need to talk with a health care provider about what activities are safe to do. If you have a medical condition, be sure to talk with your health care provider before you start a new exercise program. What are some exercise ideas? Moderate-intensity exercise ideas include:  Walking 1 mile (1.6 km) in about 15  minutes.  Biking.  Hiking.  Golfing.  Dancing.  Water aerobics. Vigorous-intensity exercise ideas include:  Walking 4.5 miles (7.2 km) or more in about 1 hour.  Jogging or running 5 miles (8 km) in about 1 hour.  Biking 10 miles (16.1 km) or more in about 1 hour.  Lap swimming.  Roller-skating or in-line skating.  Cross-country skiing.  Vigorous competitive sports, such as football, basketball, and soccer.  Jumping rope.  Aerobic dancing. What are some everyday activities that can help me to get exercise?  Yard work, such as: ? Pushing a lawn mower. ? Raking and bagging leaves.  Washing your car.  Pushing a stroller.  Shoveling snow.  Gardening.  Washing windows or floors. How can I be more active in my day-to-day activities?  Use stairs instead of an elevator.  Take a walk during your lunch break.  If you drive, park your car farther away from your work or school.  If you take public transportation, get off one stop early and walk the rest of the way.  Stand up or walk around during all of your indoor phone calls.  Get up, stretch, and walk around every 30 minutes throughout the day.  Enjoy exercise with a friend. Support to continue exercising will help you keep a regular routine of activity. What guidelines can I follow while exercising?  Before you start a new exercise program, talk with your health care provider.  Do not exercise so much that you hurt yourself, feel dizzy, or get very short of breath.  Wear comfortable clothes and wear shoes with good support.  Drink plenty of water while you exercise to prevent dehydration or heat stroke.  Work out until your breathing   and your heartbeat get faster. Where to find more information  [place] Department of Health and Human Services: www.hhs.gov  Centers for Disease Control and Prevention (CDC): www.cdc.gov Summary  Exercising regularly is important. It will improve your overall fitness,  flexibility, and endurance.  Regular exercise also will improve your overall health. It can help you control your weight, reduce stress, and improve your bone density.  Do not exercise so much that you hurt yourself, feel dizzy, or get very short of breath.  Before you start a new exercise program, talk with your health care provider. This information is not intended to replace advice given to you by your health care provider. Make sure you discuss any questions you have with your health care provider. Document Revised: 01/22/2017 Document Reviewed: 12/31/2016 Elsevier Patient Education  2020 Elsevier Inc.  

## 2020-02-28 LAB — CMP14+EGFR
ALT: 13 IU/L (ref 0–32)
AST: 26 IU/L (ref 0–40)
Albumin/Globulin Ratio: 1.7 (ref 1.2–2.2)
Albumin: 4.5 g/dL (ref 3.6–4.6)
Alkaline Phosphatase: 88 IU/L (ref 44–121)
BUN/Creatinine Ratio: 19 (ref 12–28)
BUN: 27 mg/dL (ref 8–27)
Bilirubin Total: 0.4 mg/dL (ref 0.0–1.2)
CO2: 26 mmol/L (ref 20–29)
Calcium: 9.2 mg/dL (ref 8.7–10.3)
Chloride: 100 mmol/L (ref 96–106)
Creatinine, Ser: 1.44 mg/dL — ABNORMAL HIGH (ref 0.57–1.00)
GFR calc Af Amer: 38 mL/min/{1.73_m2} — ABNORMAL LOW (ref >59)
GFR calc non Af Amer: 33 mL/min/{1.73_m2} — ABNORMAL LOW (ref >59)
Globulin, Total: 2.7 g/dL (ref 1.5–4.5)
Glucose: 96 mg/dL (ref 65–99)
Potassium: 4.6 mmol/L (ref 3.5–5.2)
Sodium: 136 mmol/L (ref 134–144)
Total Protein: 7.2 g/dL (ref 6.0–8.5)

## 2020-02-28 LAB — LIPID PANEL
Chol/HDL Ratio: 2.2 ratio (ref 0.0–4.4)
Cholesterol, Total: 137 mg/dL (ref 100–199)
HDL: 61 mg/dL (ref >39)
LDL Chol Calc (NIH): 46 mg/dL (ref 0–99)
Triglycerides: 183 mg/dL — ABNORMAL HIGH (ref 0–149)
VLDL Cholesterol Cal: 30 mg/dL (ref 5–40)

## 2020-02-28 LAB — CBC WITH DIFFERENTIAL/PLATELET
Basophils Absolute: 0.1 10*3/uL (ref 0.0–0.2)
Basos: 1 %
EOS (ABSOLUTE): 0.1 10*3/uL (ref 0.0–0.4)
Eos: 3 %
Hematocrit: 37.1 % (ref 34.0–46.6)
Hemoglobin: 12.5 g/dL (ref 11.1–15.9)
Immature Grans (Abs): 0 10*3/uL (ref 0.0–0.1)
Immature Granulocytes: 0 %
Lymphocytes Absolute: 1.7 10*3/uL (ref 0.7–3.1)
Lymphs: 35 %
MCH: 32.6 pg (ref 26.6–33.0)
MCHC: 33.7 g/dL (ref 31.5–35.7)
MCV: 97 fL (ref 79–97)
Monocytes Absolute: 0.6 10*3/uL (ref 0.1–0.9)
Monocytes: 12 %
Neutrophils Absolute: 2.4 10*3/uL (ref 1.4–7.0)
Neutrophils: 49 %
Platelets: 157 10*3/uL (ref 150–450)
RBC: 3.84 x10E6/uL (ref 3.77–5.28)
RDW: 12.5 % (ref 11.7–15.4)
WBC: 4.9 10*3/uL (ref 3.4–10.8)

## 2020-02-28 LAB — THYROID PANEL WITH TSH
Free Thyroxine Index: 2.7 (ref 1.2–4.9)
T3 Uptake Ratio: 31 % (ref 24–39)
T4, Total: 8.6 ug/dL (ref 4.5–12.0)
TSH: 0.511 u[IU]/mL (ref 0.450–4.500)

## 2020-03-20 ENCOUNTER — Ambulatory Visit: Payer: Medicare HMO | Admitting: Family Medicine

## 2020-03-20 ENCOUNTER — Ambulatory Visit (INDEPENDENT_AMBULATORY_CARE_PROVIDER_SITE_OTHER): Payer: Medicare HMO | Admitting: Family Medicine

## 2020-03-20 DIAGNOSIS — J069 Acute upper respiratory infection, unspecified: Secondary | ICD-10-CM | POA: Diagnosis not present

## 2020-03-20 MED ORDER — BENZONATATE 100 MG PO CAPS: 100.0000 mg | ORAL_CAPSULE | Freq: Three times a day (TID) | ORAL | 0 refills | Status: DC | PRN
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
## 2020-03-22 ENCOUNTER — Telehealth: Payer: Self-pay

## 2020-03-22 ENCOUNTER — Other Ambulatory Visit: Payer: Medicare HMO

## 2020-03-22 DIAGNOSIS — J069 Acute upper respiratory infection, unspecified: Secondary | ICD-10-CM | POA: Diagnosis not present

## 2020-03-22 LAB — VERITOR FLU A/B WAIVED
Influenza A: NEGATIVE
Influenza B: NEGATIVE

## 2020-03-22 NOTE — Telephone Encounter (Signed)
Daughter aware.

## 2020-03-22 NOTE — Telephone Encounter (Signed)
Aware of negative flu results

## 2020-03-22 NOTE — Telephone Encounter (Signed)
Please have her come in for viral testing as I ordered.  Is she experiencing fever, pus from her nose, brown or bloody cough?  Trying to understand need for antibiotic

## 2020-03-24 DIAGNOSIS — R0602 Shortness of breath: Secondary | ICD-10-CM | POA: Diagnosis not present

## 2020-03-24 DIAGNOSIS — J189 Pneumonia, unspecified organism: Secondary | ICD-10-CM | POA: Diagnosis not present

## 2020-03-24 DIAGNOSIS — R509 Fever, unspecified: Secondary | ICD-10-CM | POA: Diagnosis not present

## 2020-03-24 DIAGNOSIS — R059 Cough, unspecified: Secondary | ICD-10-CM | POA: Diagnosis not present

## 2020-03-24 LAB — SARS-COV-2, NAA 2 DAY TAT

## 2020-03-24 LAB — NOVEL CORONAVIRUS, NAA: SARS-CoV-2, NAA: DETECTED — AB

## 2020-03-27 DIAGNOSIS — R059 Cough, unspecified: Secondary | ICD-10-CM | POA: Diagnosis not present

## 2020-03-27 DIAGNOSIS — J189 Pneumonia, unspecified organism: Secondary | ICD-10-CM | POA: Diagnosis not present

## 2020-07-03 ENCOUNTER — Other Ambulatory Visit: Payer: Self-pay | Admitting: Nurse Practitioner

## 2020-07-03 DIAGNOSIS — E782 Mixed hyperlipidemia: Secondary | ICD-10-CM

## 2020-07-03 DIAGNOSIS — I10 Essential (primary) hypertension: Secondary | ICD-10-CM

## 2020-07-30 DIAGNOSIS — Z85828 Personal history of other malignant neoplasm of skin: Secondary | ICD-10-CM | POA: Diagnosis not present

## 2020-07-30 DIAGNOSIS — D485 Neoplasm of uncertain behavior of skin: Secondary | ICD-10-CM | POA: Diagnosis not present

## 2020-07-30 DIAGNOSIS — D1801 Hemangioma of skin and subcutaneous tissue: Secondary | ICD-10-CM | POA: Diagnosis not present

## 2020-07-30 DIAGNOSIS — C44319 Basal cell carcinoma of skin of other parts of face: Secondary | ICD-10-CM | POA: Diagnosis not present

## 2020-07-30 DIAGNOSIS — L814 Other melanin hyperpigmentation: Secondary | ICD-10-CM | POA: Diagnosis not present

## 2020-07-30 DIAGNOSIS — L570 Actinic keratosis: Secondary | ICD-10-CM | POA: Diagnosis not present

## 2020-08-21 ENCOUNTER — Other Ambulatory Visit: Payer: Self-pay | Admitting: Nurse Practitioner

## 2020-08-21 DIAGNOSIS — I10 Essential (primary) hypertension: Secondary | ICD-10-CM

## 2020-08-22 DIAGNOSIS — C44319 Basal cell carcinoma of skin of other parts of face: Secondary | ICD-10-CM | POA: Diagnosis not present

## 2020-08-27 ENCOUNTER — Other Ambulatory Visit: Payer: Self-pay

## 2020-08-27 ENCOUNTER — Ambulatory Visit (INDEPENDENT_AMBULATORY_CARE_PROVIDER_SITE_OTHER): Payer: Medicare HMO | Admitting: Nurse Practitioner

## 2020-08-27 ENCOUNTER — Encounter: Payer: Self-pay | Admitting: Nurse Practitioner

## 2020-08-27 VITALS — BP 140/63 | HR 72 | Temp 97.4°F | Resp 20 | Ht 63.0 in | Wt 156.0 lb

## 2020-08-27 DIAGNOSIS — I872 Venous insufficiency (chronic) (peripheral): Secondary | ICD-10-CM

## 2020-08-27 DIAGNOSIS — E782 Mixed hyperlipidemia: Secondary | ICD-10-CM | POA: Diagnosis not present

## 2020-08-27 DIAGNOSIS — E039 Hypothyroidism, unspecified: Secondary | ICD-10-CM | POA: Diagnosis not present

## 2020-08-27 DIAGNOSIS — N1831 Chronic kidney disease, stage 3a: Secondary | ICD-10-CM

## 2020-08-27 DIAGNOSIS — I10 Essential (primary) hypertension: Secondary | ICD-10-CM

## 2020-08-27 DIAGNOSIS — Z6828 Body mass index (BMI) 28.0-28.9, adult: Secondary | ICD-10-CM | POA: Diagnosis not present

## 2020-08-27 MED ORDER — FUROSEMIDE 20 MG PO TABS: 20.0000 mg | ORAL_TABLET | Freq: Every day | ORAL | 1 refills | Status: DC

## 2020-08-27 MED ORDER — METOPROLOL TARTRATE 50 MG PO TABS: 50.0000 mg | ORAL_TABLET | Freq: Two times a day (BID) | ORAL | 1 refills | Status: DC

## 2020-08-27 MED ORDER — SIMVASTATIN 20 MG PO TABS: 20.0000 mg | ORAL_TABLET | Freq: Every day | ORAL | 1 refills | Status: DC

## 2020-08-27 MED ORDER — AMLODIPINE BESYLATE 10 MG PO TABS: 10.0000 mg | ORAL_TABLET | Freq: Every day | ORAL | 1 refills | Status: DC

## 2020-08-27 MED ORDER — LEVOTHYROXINE SODIUM 75 MCG PO TABS: 75.0000 ug | ORAL_TABLET | Freq: Every day | ORAL | 1 refills | Status: DC

## 2020-08-27 MED ORDER — TRIAMCINOLONE ACETONIDE 0.1 % EX CREA: TOPICAL_CREAM | CUTANEOUS | 1 refills | Status: DC

## 2020-08-27 NOTE — Patient Instructions (Signed)
Peripheral Edema Peripheral edema is swelling that is caused by a buildup of fluid. Peripheral edema most often affects the lower legs, ankles, and feet. It can also develop in the arms, hands, and face. The area of the body that has peripheral edema will look swollen. It may also feel heavy or warm. Your clothes may start to feel tight. Pressing on the area may make a temporary dent in your skin. You may not be able to move your swollen arm or leg as much as usual. There are many causes of peripheral edema. It can happen because of a complication of other conditions such as congestive heart failure, kidney disease, or a problem with your blood circulation. It also can be a side effect of certain medicines or because of an infection. It often happens to women during pregnancy. Sometimes, the cause is not known. Follow these instructions at home: Managing pain, stiffness, and swelling  Raise (elevate) your legs while you are sitting or lying down. Move around often to prevent stiffness and to lessen swelling. Do not sit or stand for long periods of time. Wear support stockings as told by your health care provider. Medicines Take over-the-counter and prescription medicines only as told by your health care provider. Your health care provider may prescribe medicine to help your body get rid of excess water (diuretic). General instructions Pay attention to any changes in your symptoms. Follow instructions from your health care provider about limiting salt (sodium) in your diet. Sometimes, eating less salt may reduce swelling. Moisturize skin daily to help prevent skin from cracking and draining. Keep all follow-up visits as told by your health care provider. This is important. Contact a health care provider if you have: A fever. Edema that starts suddenly or is getting worse, especially if you are pregnant or have a medical condition. Swelling in only one leg. Increased swelling, redness, or pain in  one or both of your legs. Drainage or sores at the area where you have edema. Get help right away if you: Develop shortness of breath, especially when you are lying down. Have pain in your chest or abdomen. Feel weak. Feel faint. Summary Peripheral edema is swelling that is caused by a buildup of fluid. Peripheral edema most often affects the lower legs, ankles, and feet. Move around often to prevent stiffness and to lessen swelling. Do not sit or stand for long periods of time. Pay attention to any changes in your symptoms. Contact a health care provider if you have edema that starts suddenly or is getting worse, especially if you are pregnant or have a medical condition. Get help right away if you develop shortness of breath, especially when lying down. This information is not intended to replace advice given to you by your health care provider. Make sure you discuss any questions you have with your health care provider. Document Revised: 11/03/2017 Document Reviewed: 11/03/2017 Elsevier Patient Education  2022 Elsevier Inc.  

## 2020-08-27 NOTE — Progress Notes (Signed)
Subjective:    Patient ID: Audrey Manning, female    DOB: 10-29-1932, 87 y.o.   MRN: [id number]  Chief Complaint: Medical Management of Chronic Issues    HPI:  1. Primary hypertension No c/o chest pain, sob or headache. Does not check blood pressure at home. BP Readings from Last 3 Encounters:  08/27/20 140/63  02/27/20 138/80  11/27/19 (!) 144/66     2. Mixed hyperlipidemia Is trying to wtatch diet but does very little exercise. Lab Results  Component Value Date   CHOL 137 02/27/2020   HDL 61 02/27/2020   LDLCALC 46 02/27/2020   TRIG 183 (H) 02/27/2020   CHOLHDL 2.2 02/27/2020     3. Acquired hypothyroidism No problem that she is aware of. Lab Results  Component Value Date   TSH 0.511 02/27/2020     4. Stage 3a chronic kidney disease (HCC) No problems voiding Lab Results  Component Value Date   CREATININE 1.44 (H) 02/27/2020     5. Periphral edema- right leg only Is on lasix daily- swelling goes down and night and reaccumilates throughout the day   6. BMI 28.0-28.9,adult No recent weight changes Wt Readings from Last 3 Encounters:  08/27/20 156 lb (70.8 kg)  02/27/20 157 lb (71.2 kg)  11/27/19 154 lb (69.9 kg)   BMI Readings from Last 3 Encounters:  08/27/20 27.63 kg/m  02/27/20 27.81 kg/m  11/27/19 27.28 kg/m       Outpatient Encounter Medications as of 08/27/2020  Medication Sig   amLODipine (NORVASC) 10 MG tablet Take 1 tablet (10 mg total) by mouth daily.   Cholecalciferol (VITAMIN D3) 1000 units CAPS Take 1 capsule (1,000 Units total) by mouth daily.   furosemide (LASIX) 20 MG tablet Take 1 tablet by mouth once daily   levothyroxine (SYNTHROID) 75 MCG tablet Take 1 tablet (75 mcg total) by mouth daily.   metoprolol tartrate (LOPRESSOR) 50 MG tablet Take 1 tablet by mouth twice daily   simvastatin (ZOCOR) 20 MG tablet TAKE 1 TABLET BY MOUTH AT BEDTIME   triamcinolone cream (KENALOG) 0.1 % APPLY TO AFFECTED AREA TWICE DAILY    [DISCONTINUED] benzonatate (TESSALON PERLES) 100 MG capsule Take 1 capsule (100 mg total) by mouth 3 (three) times daily as needed.   No facility-administered encounter medications on file as of 08/27/2020.    Past Surgical History:  Procedure Laterality Date   APPENDECTOMY     TONSILLECTOMY      Family History  Problem Relation Age of Onset   Heart disease Father     New complaints: None today  Social history: Her daughter lives with her  Controlled substance contract: n/a     Review of Systems  Constitutional:  Negative for diaphoresis.  Eyes:  Negative for pain.  Respiratory:  Negative for shortness of breath.   Cardiovascular:  Negative for chest pain, palpitations and leg swelling.  Gastrointestinal:  Negative for abdominal pain.  Endocrine: Negative for polydipsia.  Skin:  Negative for rash.  Neurological:  Negative for dizziness, weakness and headaches.  Hematological:  Does not bruise/bleed easily.  All other systems reviewed and are negative.     Objective:   Physical Exam Vitals and nursing note reviewed.  Constitutional:      General: She is not in acute distress.    Appearance: Normal appearance. She is well-developed.  HENT:     Head: Normocephalic.     Right Ear: Tympanic membrane normal.     Left Ear: Tympanic membrane normal.  Nose: Nose normal.     Mouth/Throat:     Mouth: Mucous membranes are moist.  Eyes:     Pupils: Pupils are equal, round, and reactive to light.  Neck:     Vascular: No carotid bruit or JVD.  Cardiovascular:     Rate and Rhythm: Normal rate and regular rhythm.     Heart sounds: Normal heart sounds.  Pulmonary:     Effort: Pulmonary effort is normal. No respiratory distress.     Breath sounds: Normal breath sounds. No wheezing or rales.  Chest:     Chest wall: No tenderness.  Abdominal:     General: Bowel sounds are normal. There is no distension or abdominal bruit.     Palpations: Abdomen is soft. There is no  hepatomegaly, splenomegaly, mass or pulsatile mass.     Tenderness: There is no abdominal tenderness.  Musculoskeletal:        General: Normal range of motion.     Cervical back: Normal range of motion and neck supple.     Right lower leg: Edema (3+) present.     Left lower leg: No edema.  Lymphadenopathy:     Cervical: No cervical adenopathy.  Skin:    General: Skin is warm and dry.  Neurological:     Mental Status: She is alert and oriented to person, place, and time.     Deep Tendon Reflexes: Reflexes are normal and symmetric.  Psychiatric:        Behavior: Behavior normal.        Thought Content: Thought content normal.        Judgment: Judgment normal.    BP 140/63   Pulse 72   Temp (!) 97.4 F (36.3 C) (Temporal)   Resp 20   Ht 5' 3" (1.6 m)   Wt 156 lb (70.8 kg)   SpO2 97%   BMI 27.63 kg/m        Assessment & Plan:  Teneka Malmberg comes in today with chief complaint of Medical Management of Chronic Issues   Diagnosis and orders addressed:  1. Primary hypertension Low sodium diet - metoprolol tartrate (LOPRESSOR) 50 MG tablet; Take 1 tablet (50 mg total) by mouth 2 (two) times daily.  Dispense: 180 tablet; Refill: 1 - amLODipine (NORVASC) 10 MG tablet; Take 1 tablet (10 mg total) by mouth daily.  Dispense: 90 tablet; Refill: 1 - CBC with Differential/Platelet - CMP14+EGFR  2. Mixed hyperlipidemia Low fat diet - simvastatin (ZOCOR) 20 MG tablet; Take 1 tablet (20 mg total) by mouth at bedtime.  Dispense: 90 tablet; Refill: 1 - Lipid panel  3. Acquired hypothyroidism Labs oending - levothyroxine (SYNTHROID) 75 MCG tablet; Take 1 tablet (75 mcg total) by mouth daily.  Dispense: 90 tablet; Refill: 1 - Thyroid Panel With TSH  4. Stage 3a chronic kidney disease ([place]) Labs pending  5. BMI 28.0-28.9,adult Discussed diet and exercise for person with BMI >25 Will recheck weight in 3-6 months  6. Edema of right lower extremity due to peripheral venous  insufficiency Elevate legs when sitting - furosemide (LASIX) 20 MG tablet; Take 1 tablet (20 mg total) by mouth daily.  Dispense: 90 tablet; Refill: 1   Labs pending Health Maintenance reviewed Diet and exercise encouraged  Follow up plan: 6 months   [name] Done, FNP

## 2020-08-28 LAB — CMP14+EGFR
ALT: 11 IU/L (ref 0–32)
AST: 23 IU/L (ref 0–40)
Albumin/Globulin Ratio: 1.6 (ref 1.2–2.2)
Albumin: 4.5 g/dL (ref 3.6–4.6)
Alkaline Phosphatase: 97 IU/L (ref 44–121)
BUN/Creatinine Ratio: 31 — ABNORMAL HIGH (ref 12–28)
BUN: 42 mg/dL — ABNORMAL HIGH (ref 8–27)
Bilirubin Total: 0.4 mg/dL (ref 0.0–1.2)
CO2: 23 mmol/L (ref 20–29)
Calcium: 9.3 mg/dL (ref 8.7–10.3)
Chloride: 99 mmol/L (ref 96–106)
Creatinine, Ser: 1.37 mg/dL — ABNORMAL HIGH (ref 0.57–1.00)
Globulin, Total: 2.9 g/dL (ref 1.5–4.5)
Glucose: 105 mg/dL — ABNORMAL HIGH (ref 65–99)
Potassium: 4.5 mmol/L (ref 3.5–5.2)
Sodium: 136 mmol/L (ref 134–144)
Total Protein: 7.4 g/dL (ref 6.0–8.5)
eGFR: 38 mL/min/{1.73_m2} — ABNORMAL LOW (ref >59)

## 2020-08-28 LAB — LIPID PANEL
Chol/HDL Ratio: 1.9 ratio (ref 0.0–4.4)
Cholesterol, Total: 130 mg/dL (ref 100–199)
HDL: 70 mg/dL (ref >39)
LDL Chol Calc (NIH): 45 mg/dL (ref 0–99)
Triglycerides: 74 mg/dL (ref 0–149)
VLDL Cholesterol Cal: 15 mg/dL (ref 5–40)

## 2020-08-28 LAB — CBC WITH DIFFERENTIAL/PLATELET
Basophils Absolute: 0.1 10*3/uL (ref 0.0–0.2)
Basos: 1 %
EOS (ABSOLUTE): 0.1 10*3/uL (ref 0.0–0.4)
Eos: 2 %
Hematocrit: 34.9 % (ref 34.0–46.6)
Hemoglobin: 11.8 g/dL (ref 11.1–15.9)
Immature Grans (Abs): 0 10*3/uL (ref 0.0–0.1)
Immature Granulocytes: 0 %
Lymphocytes Absolute: 2.6 10*3/uL (ref 0.7–3.1)
Lymphs: 43 %
MCH: 32.1 pg (ref 26.6–33.0)
MCHC: 33.8 g/dL (ref 31.5–35.7)
MCV: 95 fL (ref 79–97)
Monocytes Absolute: 0.6 10*3/uL (ref 0.1–0.9)
Monocytes: 10 %
Neutrophils Absolute: 2.7 10*3/uL (ref 1.4–7.0)
Neutrophils: 44 %
Platelets: 188 10*3/uL (ref 150–450)
RBC: 3.68 x10E6/uL — ABNORMAL LOW (ref 3.77–5.28)
RDW: 12.9 % (ref 11.7–15.4)
WBC: 6.1 10*3/uL (ref 3.4–10.8)

## 2020-08-28 LAB — THYROID PANEL WITH TSH
Free Thyroxine Index: 2.6 (ref 1.2–4.9)
T3 Uptake Ratio: 30 % (ref 24–39)
T4, Total: 8.6 ug/dL (ref 4.5–12.0)
TSH: 0.841 u[IU]/mL (ref 0.450–4.500)

## 2020-11-21 ENCOUNTER — Other Ambulatory Visit: Payer: Self-pay | Admitting: Nurse Practitioner

## 2020-11-21 DIAGNOSIS — I872 Venous insufficiency (chronic) (peripheral): Secondary | ICD-10-CM

## 2021-01-30 ENCOUNTER — Ambulatory Visit (INDEPENDENT_AMBULATORY_CARE_PROVIDER_SITE_OTHER): Payer: Medicare HMO

## 2021-01-30 DIAGNOSIS — Z23 Encounter for immunization: Secondary | ICD-10-CM

## 2021-02-27 ENCOUNTER — Ambulatory Visit (INDEPENDENT_AMBULATORY_CARE_PROVIDER_SITE_OTHER): Payer: Medicare HMO | Admitting: Nurse Practitioner

## 2021-02-27 ENCOUNTER — Encounter: Payer: Self-pay | Admitting: Nurse Practitioner

## 2021-02-27 VITALS — BP 142/68 | HR 71 | Temp 97.2°F | Resp 20 | Ht 63.0 in | Wt 154.0 lb

## 2021-02-27 DIAGNOSIS — I10 Essential (primary) hypertension: Secondary | ICD-10-CM

## 2021-02-27 DIAGNOSIS — M810 Age-related osteoporosis without current pathological fracture: Secondary | ICD-10-CM | POA: Diagnosis not present

## 2021-02-27 DIAGNOSIS — E039 Hypothyroidism, unspecified: Secondary | ICD-10-CM

## 2021-02-27 DIAGNOSIS — I872 Venous insufficiency (chronic) (peripheral): Secondary | ICD-10-CM | POA: Diagnosis not present

## 2021-02-27 DIAGNOSIS — E782 Mixed hyperlipidemia: Secondary | ICD-10-CM

## 2021-02-27 DIAGNOSIS — Z6828 Body mass index (BMI) 28.0-28.9, adult: Secondary | ICD-10-CM | POA: Diagnosis not present

## 2021-02-27 DIAGNOSIS — N1831 Chronic kidney disease, stage 3a: Secondary | ICD-10-CM

## 2021-02-27 MED ORDER — SIMVASTATIN 20 MG PO TABS: 20.0000 mg | ORAL_TABLET | Freq: Every day | ORAL | 1 refills | Status: DC

## 2021-02-27 MED ORDER — FUROSEMIDE 20 MG PO TABS: 20.0000 mg | ORAL_TABLET | Freq: Every day | ORAL | 1 refills | Status: DC

## 2021-02-27 MED ORDER — LEVOTHYROXINE SODIUM 75 MCG PO TABS: 75.0000 ug | ORAL_TABLET | Freq: Every day | ORAL | 1 refills | Status: DC

## 2021-02-27 MED ORDER — AMLODIPINE BESYLATE 10 MG PO TABS: 10.0000 mg | ORAL_TABLET | Freq: Every day | ORAL | 1 refills | Status: DC

## 2021-02-27 MED ORDER — METOPROLOL TARTRATE 50 MG PO TABS: 50.0000 mg | ORAL_TABLET | Freq: Two times a day (BID) | ORAL | 1 refills | Status: DC

## 2021-02-27 NOTE — Patient Instructions (Signed)
Bone Health Bones protect organs, store calcium, anchor muscles, and support the whole body. Keeping your bones strong is important, especially as you get older. You can take actions to help keep your bones strong and healthy. Why is keeping my bones healthy important? Keeping your bones healthy is important because your body constantly replaces bone cells. Cells get old, and new cells take their place. As we age, we lose bone cells because the body may not be able to make enough new cells to replace the old cells. The amount of bone cells and bone tissue you have is referred to as bone mass. The higher your bone mass, the stronger your bones. The aging process leads to an overall loss of bone mass in the body, which can increase the likelihood of: Broken bones. A condition in which the bones become weak and brittle (osteoporosis). A large decline in bone mass occurs in older adults. In women, it occurs about the time of menopause. What actions can I take to keep my bones healthy? Good health habits are important for maintaining healthy bones. This includes eating nutritious foods and exercising regularly. To have healthy bones, you need to get enough of the right minerals and vitamins. Most nutrition experts recommend getting these nutrients from the foods that you eat. In some cases, taking supplements may also be recommended. Doing certain types of exercise is also important for bone health. What are the nutritional recommendations for healthy bones? Eating a well-balanced diet with plenty of calcium and vitamin D will help to protect your bones. Nutritional recommendations vary from person to person. Ask your health care provider what is healthy for you. Here are some general guidelines. Get enough calcium Calcium is the most important (essential) mineral for bone health. Most people can get enough calcium from their diet, but supplements may be recommended for people who are at risk for  osteoporosis. Good sources of calcium include: Dairy products, such as low-fat or nonfat milk, cheese, and yogurt. Dark green leafy vegetables, such as bok choy and broccoli. Foods that have calcium added to them (are fortified). Foods that may be fortified with calcium include orange juice, cereal, bread, soy beverages, and tofu products. Nuts, such as almonds. Follow these recommended amounts for daily calcium intake: Infants, 0-6 months: 200 mg. Infants, 6-12 months: 260 mg. Children, age 5-3: 700 mg. Children, age 6-8: 1,000 mg. Children, age 25-13: 1,300 mg. Teens, age 88-18: 1,300 mg. Adults, age 52-50: 1,000 mg. Adults, age 56-70: Men: 1,000 mg. Women: 1,200 mg. Adults, age 84 or older: 1,200 mg. Pregnant and breastfeeding females: Teens: 1,300 mg. Adults: 1,000 mg. Get enough vitamin D Vitamin D is the most essential vitamin for bone health. It helps the body absorb calcium. Sunlight stimulates the skin to make vitamin D, so be sure to get enough sunlight. If you live in a cold climate or you do not get outside often, your health care provider may recommend that you take vitamin D supplements. Good sources of vitamin D in your diet include: Egg yolks. Saltwater fish. Milk and cereal fortified with vitamin D. Follow these recommended amounts for daily vitamin D intake: Infants, 0-12 months: 400 international units (IU). Children and teens, age 5-18: 79 international units. Adults, age 55 or younger: 57 international units. Adults, age 58 or older: 89-1,000 international units. Get other important nutrients Other nutrients that are important for bone health include: Phosphorus. This mineral is found in meat, poultry, dairy foods, nuts, and legumes. The recommended daily  intake for adult men and adult women is 700 mg. Magnesium. This mineral is found in seeds, nuts, dark green vegetables, and legumes. The recommended daily intake for adult men is 400-420 mg. For adult women,  it is 310-320 mg. Vitamin K. This vitamin is found in green leafy vegetables. The recommended daily intake is 120 mcg for adult men and 90 mcg for adult women. What type of physical activity is best for building and maintaining healthy bones? Weight-bearing and strength-building activities are important for building and maintaining healthy bones. Weight-bearing activities cause muscles and bones to work against gravity. Strength-building activities increase the strength of the muscles that support bones. Weight-bearing and muscle-building activities include: Walking and hiking. Jogging and running. Dancing. Gym exercises. Lifting weights. Tennis and racquetball. Climbing stairs. Aerobics. Adults should get at least 30 minutes of moderate physical activity on most days. Children should get at least 60 minutes of moderate physical activity on most days. Ask your health care provider what type of exercise is best for you. How can I find out if my bone mass is low? Bone mass can be measured with an X-ray test called a bone mineral density (BMD) test. This test is recommended for all women who are age 47 or older. It may also be recommended for: Men who are age 31 or older. People who are at risk for osteoporosis because of: Having a long-term disease that weakens bones, such as kidney disease or rheumatoid arthritis. Having menopause earlier than normal. Taking medicine that weakens bones, such as steroids, thyroid hormones, or hormone treatment for breast cancer or prostate cancer. Smoking. Drinking three or more alcoholic drinks a day. Being underweight. Sedentary lifestyle. If you find that you have a low bone mass, you may be able to prevent osteoporosis or further bone loss by changing your diet and lifestyle. Where can I find more information? Bone Health & Osteoporosis Foundation: [employer] of Health: www.bones.SouthExposed.es International Osteoporosis  Foundation: Administrator.iofbonehealth.org Summary The aging process leads to an overall loss of bone mass in the body, which can increase the likelihood of broken bones and osteoporosis. Eating a well-balanced diet with plenty of calcium and vitamin D will help to protect your bones. Weight-bearing and strength-building activities are also important for building and maintaining strong bones. Bone mass can be measured with an X-ray test called a bone mineral density (BMD) test. This information is not intended to replace advice given to you by your health care provider. Make sure you discuss any questions you have with your health care provider. Document Revised: 07/24/2020 Document Reviewed: 07/24/2020 Elsevier Patient Education  [place].

## 2021-02-27 NOTE — Progress Notes (Signed)
Subjective:    Patient ID: Audrey Manning, female    DOB: 1932/06/03, 88 y.o.   MRN: [id number]   Chief Complaint: Medical Management of Chronic Issues    HPI:  Audrey Manning is a 88 y.o. who identifies as a female who was assigned female at birth.   Social history: Lives with: her daughter lives with her Work history: retired   Scientist, forensic in today for follow up of the following chronic medical issues:  1. Primary hypertension No c/o chest pain, sob or headaches. Does check her blood pressure at home. Runs around 569 systolic. BP Readings from Last 3 Encounters:  02/27/21 (!) 145/67  08/27/20 140/63  02/27/20 138/80     2. Edema of right lower extremity due to peripheral venous insufficiency Haas swelling in right leg but not left. Props it up when she is sitting  3. Acquired hypothyroidism No problems that aware of. Lab Results  Component Value Date   TSH 0.841 08/27/2020     4. Stage 3a chronic kidney disease (HCC) No problems voiding Lab Results  Component Value Date   CREATININE 1.37 (H) 08/27/2020     5. Mixed hyperlipidemia Does tyr to watch diet and does little to no dedicated exercise. Lab Results  Component Value Date   CHOL 130 08/27/2020   HDL 70 08/27/2020   LDLCALC 45 08/27/2020   TRIG 74 08/27/2020   CHOLHDL 1.9 08/27/2020     6. Age-related osteoporosis without current pathological fracture Last dexascan was done in 2018. She doe snot want to repeat today  7. BMI 28.0-28.9,adult No recent weight changes Wt Readings from Last 3 Encounters:  02/27/21 154 lb (69.9 kg)  08/27/20 156 lb (70.8 kg)  02/27/20 157 lb (71.2 kg)   BMI Readings from Last 3 Encounters:  02/27/21 27.28 kg/m  08/27/20 27.63 kg/m  02/27/20 27.81 kg/m      New complaints: None today  Allergies  Allergen Reactions   Ace Inhibitors Cough   Outpatient Encounter Medications as of 02/27/2021  Medication Sig   amLODipine (NORVASC) 10 MG tablet Take 1 tablet  (10 mg total) by mouth daily.   Cholecalciferol (VITAMIN D3) 1000 units CAPS Take 1 capsule (1,000 Units total) by mouth daily.   furosemide (LASIX) 20 MG tablet Take 1 tablet by mouth once daily   levothyroxine (SYNTHROID) 75 MCG tablet Take 1 tablet (75 mcg total) by mouth daily.   metoprolol tartrate (LOPRESSOR) 50 MG tablet Take 1 tablet (50 mg total) by mouth 2 (two) times daily.   simvastatin (ZOCOR) 20 MG tablet Take 1 tablet (20 mg total) by mouth at bedtime.   triamcinolone cream (KENALOG) 0.1 % APPLY TO AFFECTED AREA TWICE DAILY   No facility-administered encounter medications on file as of 02/27/2021.    Past Surgical History:  Procedure Laterality Date   APPENDECTOMY     TONSILLECTOMY      Family History  Problem Relation Age of Onset   Heart disease Father       Controlled substance contract: n/a     Review of Systems  Constitutional:  Negative for diaphoresis.  Eyes:  Negative for pain.  Respiratory:  Negative for shortness of breath.   Cardiovascular:  Negative for chest pain, palpitations and leg swelling.  Gastrointestinal:  Negative for abdominal pain.  Endocrine: Negative for polydipsia.  Skin:  Negative for rash.  Neurological:  Negative for dizziness, weakness and headaches.  Hematological:  Does not bruise/bleed easily.  All other systems reviewed and  are negative.     Objective:   Physical Exam Vitals and nursing note reviewed.  Constitutional:      General: She is not in acute distress.    Appearance: Normal appearance. She is well-developed.  HENT:     Head: Normocephalic.     Right Ear: Tympanic membrane normal.     Left Ear: Tympanic membrane normal.     Nose: Nose normal.     Mouth/Throat:     Mouth: Mucous membranes are moist.  Eyes:     Pupils: Pupils are equal, round, and reactive to light.  Neck:     Vascular: No carotid bruit or JVD.  Cardiovascular:     Rate and Rhythm: Normal rate and regular rhythm.     Heart sounds:  Normal heart sounds.  Pulmonary:     Effort: Pulmonary effort is normal. No respiratory distress.     Breath sounds: Normal breath sounds. No wheezing or rales.  Chest:     Chest wall: No tenderness.  Abdominal:     General: Bowel sounds are normal. There is no distension or abdominal bruit.     Palpations: Abdomen is soft. There is no hepatomegaly, splenomegaly, mass or pulsatile mass.     Tenderness: There is no abdominal tenderness.  Musculoskeletal:        General: Normal range of motion.     Cervical back: Normal range of motion and neck supple.     Right lower leg: Edema (2+) present.  Lymphadenopathy:     Cervical: No cervical adenopathy.  Skin:    General: Skin is warm and dry.  Neurological:     Mental Status: She is alert and oriented to person, place, and time.     Deep Tendon Reflexes: Reflexes are normal and symmetric.  Psychiatric:        Behavior: Behavior normal.        Thought Content: Thought content normal.        Judgment: Judgment normal.    BP (!) 142/68    Pulse 71    Temp (!) 97.2 F (36.2 C) (Temporal)    Resp 20    Ht _0  (1.6 m)    Wt 154 lb (69.9 kg)    SpO2 99%    BMI 27.28 kg/m         Assessment & Plan:  Audrey Manning comes in today with chief complaint of Medical Management of Chronic Issues   Diagnosis and orders addressed:  1. Primary hypertension Low sodium diet - CBC with Differential/Platelet - CMP14+EGFR - metoprolol tartrate (LOPRESSOR) 50 MG tablet; Take 1 tablet (50 mg total) by mouth 2 (two) times daily.  Dispense: 180 tablet; Refill: 1 - amLODipine (NORVASC) 10 MG tablet; Take 1 tablet (10 mg total) by mouth daily.  Dispense: 90 tablet; Refill: 1  2. Edema of right lower extremity due to peripheral venous insufficiency Elevate legs when sitting - furosemide (LASIX) 20 MG tablet; Take 1 tablet (20 mg total) by mouth daily.  Dispense: 90 tablet; Refill: 1  3. Acquired hypothyroidism - Thyroid Panel With TSH -  levothyroxine (SYNTHROID) 75 MCG tablet; Take 1 tablet (75 mcg total) by mouth daily.  Dispense: 90 tablet; Refill: 1  4. Stage 3a chronic kidney disease ([place]) Labs pending  5. Mixed hyperlipidemia Low fat diet - Lipid panel - simvastatin (ZOCOR) 20 MG tablet; Take 1 tablet (20 mg total) by mouth at bedtime.  Dispense: 90 tablet; Refill: 1  6. Age-related osteoporosis without  current pathological fracture Weight bearing exercises Will do dexascan at next visit  7. BMI 28.0-28.9,adult Discussed diet and exercise for person with BMI >25 Will recheck weight in 3-6 months    Labs pending Health Maintenance reviewed Diet and exercise encouraged  Follow up plan: 3 months   [name] Done, FNP

## 2021-02-28 LAB — CBC WITH DIFFERENTIAL/PLATELET
Basophils Absolute: 0.1 10*3/uL (ref 0.0–0.2)
Basos: 1 %
EOS (ABSOLUTE): 0.1 10*3/uL (ref 0.0–0.4)
Eos: 3 %
Hematocrit: 35.2 % (ref 34.0–46.6)
Hemoglobin: 12.1 g/dL (ref 11.1–15.9)
Immature Grans (Abs): 0 10*3/uL (ref 0.0–0.1)
Immature Granulocytes: 0 %
Lymphocytes Absolute: 1.8 10*3/uL (ref 0.7–3.1)
Lymphs: 40 %
MCH: 33 pg (ref 26.6–33.0)
MCHC: 34.4 g/dL (ref 31.5–35.7)
MCV: 96 fL (ref 79–97)
Monocytes Absolute: 0.4 10*3/uL (ref 0.1–0.9)
Monocytes: 9 %
Neutrophils Absolute: 2.2 10*3/uL (ref 1.4–7.0)
Neutrophils: 47 %
Platelets: 156 10*3/uL (ref 150–450)
RBC: 3.67 x10E6/uL — ABNORMAL LOW (ref 3.77–5.28)
RDW: 12.4 % (ref 11.7–15.4)
WBC: 4.6 10*3/uL (ref 3.4–10.8)

## 2021-02-28 LAB — CMP14+EGFR
ALT: 11 IU/L (ref 0–32)
AST: 27 IU/L (ref 0–40)
Albumin/Globulin Ratio: 1.5 (ref 1.2–2.2)
Albumin: 4.4 g/dL (ref 3.6–4.6)
Alkaline Phosphatase: 83 IU/L (ref 44–121)
BUN/Creatinine Ratio: 19 (ref 12–28)
BUN: 25 mg/dL (ref 8–27)
Bilirubin Total: 0.4 mg/dL (ref 0.0–1.2)
CO2: 21 mmol/L (ref 20–29)
Calcium: 9.2 mg/dL (ref 8.7–10.3)
Chloride: 105 mmol/L (ref 96–106)
Creatinine, Ser: 1.34 mg/dL — ABNORMAL HIGH (ref 0.57–1.00)
Globulin, Total: 2.9 g/dL (ref 1.5–4.5)
Glucose: 102 mg/dL — ABNORMAL HIGH (ref 70–99)
Potassium: 4.5 mmol/L (ref 3.5–5.2)
Sodium: 138 mmol/L (ref 134–144)
Total Protein: 7.3 g/dL (ref 6.0–8.5)
eGFR: 39 mL/min/{1.73_m2} — ABNORMAL LOW (ref >59)

## 2021-02-28 LAB — THYROID PANEL WITH TSH
Free Thyroxine Index: 3.7 (ref 1.2–4.9)
T3 Uptake Ratio: 34 % (ref 24–39)
T4, Total: 11 ug/dL (ref 4.5–12.0)
TSH: 0.235 u[IU]/mL — ABNORMAL LOW (ref 0.450–4.500)

## 2021-02-28 LAB — LIPID PANEL
Chol/HDL Ratio: 2.4 ratio (ref 0.0–4.4)
Cholesterol, Total: 139 mg/dL (ref 100–199)
HDL: 59 mg/dL (ref >39)
LDL Chol Calc (NIH): 58 mg/dL (ref 0–99)
Triglycerides: 127 mg/dL (ref 0–149)
VLDL Cholesterol Cal: 22 mg/dL (ref 5–40)

## 2021-02-28 MED ORDER — LEVOTHYROXINE SODIUM 50 MCG PO TABS: 50.0000 ug | ORAL_TABLET | Freq: Every day | ORAL | 1 refills | Status: DC

## 2021-02-28 NOTE — Addendum Note (Signed)
Addended by: [name] on: 02/28/2021 07:37 AM   Modules accepted: Orders

## 2021-03-31 ENCOUNTER — Ambulatory Visit (INDEPENDENT_AMBULATORY_CARE_PROVIDER_SITE_OTHER): Payer: Medicare HMO

## 2021-03-31 VITALS — Ht 63.0 in | Wt 150.0 lb

## 2021-03-31 DIAGNOSIS — Z0001 Encounter for general adult medical examination with abnormal findings: Secondary | ICD-10-CM | POA: Diagnosis not present

## 2021-03-31 DIAGNOSIS — M810 Age-related osteoporosis without current pathological fracture: Secondary | ICD-10-CM | POA: Diagnosis not present

## 2021-03-31 DIAGNOSIS — Z0000 Encounter for general adult medical examination without abnormal findings: Secondary | ICD-10-CM

## 2021-03-31 NOTE — Patient Instructions (Signed)
Audrey Manning , Thank you for taking time to come for your Medicare Wellness Visit. I appreciate your ongoing commitment to your health goals. Please review the following plan we discussed and let me know if I can assist you in the future.   Screening recommendations/referrals: Colonoscopy: Done 05/12/2010. No longer required. Mammogram: Done 05/12/2010. No longer required. Bone Density: Order placed today to schedule.  Recommended yearly ophthalmology/optometry visit for glaucoma screening and checkup Recommended yearly dental visit for hygiene and checkup  Vaccinations: Influenza vaccine: Done 01/30/2021. Repeat annually  Pneumococcal vaccine: Done 12/13/2007 and 06/14/2014. Tdap vaccine: Done 11/24/2010 Repeat in 10 years  Shingles vaccine: Discussed.   Covid-19:Declined.  Advanced directives: Please bring a copy of your health care power of attorney and living will to the office to be added to your chart at your convenience.   Conditions/risks identified: Aim for 30 minutes of exercise or brisk walking each day, drink 6-8 glasses of water and eat lots of fruits and vegetables. KEEP UP THE GOOD WORK!!  Next appointment: Follow up in one year for your annual wellness visit 2024.   Preventive Care 53 Years and Older, Female Preventive care refers to lifestyle choices and visits with your health care provider that can promote health and wellness. What does preventive care include? A yearly physical exam. This is also called an annual well check. Dental exams once or twice a year. Routine eye exams. Ask your health care provider how often you should have your eyes checked. Personal lifestyle choices, including: Daily care of your teeth and gums. Regular physical activity. Eating a healthy diet. Avoiding tobacco and drug use. Limiting alcohol use. Practicing safe sex. Taking low-dose aspirin every day. Taking vitamin and mineral supplements as recommended by your health care  provider. What happens during an annual well check? The services and screenings done by your health care provider during your annual well check will depend on your age, overall health, lifestyle risk factors, and family history of disease. Counseling  Your health care provider may ask you questions about your: Alcohol use. Tobacco use. Drug use. Emotional well-being. Home and relationship well-being. Sexual activity. Eating habits. History of falls. Memory and ability to understand (cognition). Work and work Statistician. Reproductive health. Screening  You may have the following tests or measurements: Height, weight, and BMI. Blood pressure. Lipid and cholesterol levels. These may be checked every 5 years, or more frequently if you are over 59 years old. Skin check. Lung cancer screening. You may have this screening every year starting at age 27 if you have a 30-pack-year history of smoking and currently smoke or have quit within the past 15 years. Fecal occult blood test (FOBT) of the stool. You may have this test every year starting at age 39. Flexible sigmoidoscopy or colonoscopy. You may have a sigmoidoscopy every 5 years or a colonoscopy every 10 years starting at age 35. Hepatitis C blood test. Hepatitis B blood test. Sexually transmitted disease (STD) testing. Diabetes screening. This is done by checking your blood sugar (glucose) after you have not eaten for a while (fasting). You may have this done every 1-3 years. Bone density scan. This is done to screen for osteoporosis. You may have this done starting at age 50. Mammogram. This may be done every 1-2 years. Talk to your health care provider about how often you should have regular mammograms. Talk with your health care provider about your test results, treatment options, and if necessary, the need for more tests. Vaccines  Your health care provider may recommend certain vaccines, such as: Influenza vaccine. This is  recommended every year. Tetanus, diphtheria, and acellular pertussis (Tdap, Td) vaccine. You may need a Td booster every 10 years. Zoster vaccine. You may need this after age 30. Pneumococcal 13-valent conjugate (PCV13) vaccine. One dose is recommended after age 69. Pneumococcal polysaccharide (PPSV23) vaccine. One dose is recommended after age 41. Talk to your health care provider about which screenings and vaccines you need and how often you need them. This information is not intended to replace advice given to you by your health care provider. Make sure you discuss any questions you have with your health care provider. Document Released: 03/08/2015 Document Revised: 10/30/2015 Document Reviewed: 12/11/2014 Elsevier Interactive Patient Education  2017 [place] Prevention in the Home Falls can cause injuries. They can happen to people of all ages. There are many things you can do to make your home safe and to help prevent falls. What can I do on the outside of my home? Regularly fix the edges of walkways and driveways and fix any cracks. Remove anything that might make you trip as you walk through a door, such as a raised step or threshold. Trim any bushes or trees on the path to your home. Use bright outdoor lighting. Clear any walking paths of anything that might make someone trip, such as rocks or tools. Regularly check to see if handrails are loose or broken. Make sure that both sides of any steps have handrails. Any raised decks and porches should have guardrails on the edges. Have any leaves, snow, or ice cleared regularly. Use sand or salt on walking paths during winter. Clean up any spills in your garage right away. This includes oil or grease spills. What can I do in the bathroom? Use night lights. Install grab bars by the toilet and in the tub and shower. Do not use towel bars as grab bars. Use non-skid mats or decals in the tub or shower. If you need to sit down in  the shower, use a plastic, non-slip stool. Keep the floor dry. Clean up any water that spills on the floor as soon as it happens. Remove soap buildup in the tub or shower regularly. Attach bath mats securely with double-sided non-slip rug tape. Do not have throw rugs and other things on the floor that can make you trip. What can I do in the bedroom? Use night lights. Make sure that you have a light by your bed that is easy to reach. Do not use any sheets or blankets that are too big for your bed. They should not hang down onto the floor. Have a firm chair that has side arms. You can use this for support while you get dressed. Do not have throw rugs and other things on the floor that can make you trip. What can I do in the kitchen? Clean up any spills right away. Avoid walking on wet floors. Keep items that you use a lot in easy-to-reach places. If you need to reach something above you, use a strong step stool that has a grab bar. Keep electrical cords out of the way. Do not use floor polish or wax that makes floors slippery. If you must use wax, use non-skid floor wax. Do not have throw rugs and other things on the floor that can make you trip. What can I do with my stairs? Do not leave any items on the stairs. Make sure that there are  handrails on both sides of the stairs and use them. Fix handrails that are broken or loose. Make sure that handrails are as long as the stairways. Check any carpeting to make sure that it is firmly attached to the stairs. Fix any carpet that is loose or worn. Avoid having throw rugs at the top or bottom of the stairs. If you do have throw rugs, attach them to the floor with carpet tape. Make sure that you have a light switch at the top of the stairs and the bottom of the stairs. If you do not have them, ask someone to add them for you. What else can I do to help prevent falls? Wear shoes that: Do not have high heels. Have rubber bottoms. Are comfortable  and fit you well. Are closed at the toe. Do not wear sandals. If you use a stepladder: Make sure that it is fully opened. Do not climb a closed stepladder. Make sure that both sides of the stepladder are locked into place. Ask someone to hold it for you, if possible. Clearly mark and make sure that you can see: Any grab bars or handrails. First and last steps. Where the edge of each step is. Use tools that help you move around (mobility aids) if they are needed. These include: Canes. Walkers. Scooters. Crutches. Turn on the lights when you go into a dark area. Replace any light bulbs as soon as they burn out. Set up your furniture so you have a clear path. Avoid moving your furniture around. If any of your floors are uneven, fix them. If there are any pets around you, be aware of where they are. Review your medicines with your doctor. Some medicines can make you feel dizzy. This can increase your chance of falling. Ask your doctor what other things that you can do to help prevent falls. This information is not intended to replace advice given to you by your health care provider. Make sure you discuss any questions you have with your health care provider. Document Released: 12/06/2008 Document Revised: 07/18/2015 Document Reviewed: 03/16/2014 Elsevier Interactive Patient Education  2017 [employer].
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
## 2021-05-29 ENCOUNTER — Ambulatory Visit (INDEPENDENT_AMBULATORY_CARE_PROVIDER_SITE_OTHER): Payer: Medicare HMO | Admitting: Nurse Practitioner

## 2021-05-29 ENCOUNTER — Ambulatory Visit: Payer: Medicare HMO

## 2021-05-29 ENCOUNTER — Encounter: Payer: Self-pay | Admitting: Nurse Practitioner

## 2021-05-29 VITALS — BP 134/62 | HR 68 | Temp 97.3°F | Resp 20 | Ht 63.0 in | Wt 153.0 lb

## 2021-05-29 DIAGNOSIS — N1831 Chronic kidney disease, stage 3a: Secondary | ICD-10-CM

## 2021-05-29 DIAGNOSIS — Z6828 Body mass index (BMI) 28.0-28.9, adult: Secondary | ICD-10-CM | POA: Diagnosis not present

## 2021-05-29 DIAGNOSIS — I872 Venous insufficiency (chronic) (peripheral): Secondary | ICD-10-CM | POA: Diagnosis not present

## 2021-05-29 DIAGNOSIS — I10 Essential (primary) hypertension: Secondary | ICD-10-CM | POA: Diagnosis not present

## 2021-05-29 DIAGNOSIS — E782 Mixed hyperlipidemia: Secondary | ICD-10-CM | POA: Diagnosis not present

## 2021-05-29 DIAGNOSIS — M810 Age-related osteoporosis without current pathological fracture: Secondary | ICD-10-CM | POA: Diagnosis not present

## 2021-05-29 DIAGNOSIS — E039 Hypothyroidism, unspecified: Secondary | ICD-10-CM

## 2021-05-29 MED ORDER — FUROSEMIDE 20 MG PO TABS: 20.0000 mg | ORAL_TABLET | Freq: Every day | ORAL | 1 refills | Status: DC

## 2021-05-29 MED ORDER — SIMVASTATIN 20 MG PO TABS: 20.0000 mg | ORAL_TABLET | Freq: Every day | ORAL | 1 refills | Status: DC

## 2021-05-29 MED ORDER — METOPROLOL TARTRATE 50 MG PO TABS: 50.0000 mg | ORAL_TABLET | Freq: Two times a day (BID) | ORAL | 1 refills | Status: DC

## 2021-05-29 MED ORDER — LEVOTHYROXINE SODIUM 50 MCG PO TABS: 50.0000 ug | ORAL_TABLET | Freq: Every day | ORAL | 1 refills | Status: DC

## 2021-05-29 MED ORDER — AMLODIPINE BESYLATE 10 MG PO TABS: 10.0000 mg | ORAL_TABLET | Freq: Every day | ORAL | 1 refills | Status: DC

## 2021-05-29 NOTE — Progress Notes (Signed)
? ?Subjective:  ? ? Patient ID: Audrey Manning, female    DOB: [birth year], 88 y.o.   MRN: [id number] ? ? ?Chief Complaint: Medical Management of Chronic Issues ?  ? ?HPI: ? ?Audrey Manning is a 88 y.o. who identifies as a female who was assigned female at birth.  ? ?Social history: ?Lives with: her daughter lives with her now ?Work history: retired ? ? ?Comes in today for follow up of the following chronic medical issues: ? ?1. Primary hypertension ?No c/o chest pain, sob or headache. Does not check blood pressure at home. ?BP Readings from Last 3 Encounters:  ?05/29/21 (!) 146/68  ?02/27/21 (!) 142/68  ?08/27/20 140/63  ? ? ? ?2. Mixed hyperlipidemia ?Does watch diet. She does stay very active. ?Lab Results  ?Component Value Date  ? CHOL 139 02/27/2021  ? HDL 59 02/27/2021  ? Declo 58 02/27/2021  ? TRIG 127 02/27/2021  ? CHOLHDL 2.4 02/27/2021  ? ?The ASCVD Risk score (Arnett DK, et al., 2019) failed to calculate for the following reasons: ?  The 2019 ASCVD risk score is only valid for ages 71 to 52 ? ? ?3. Acquired hypothyroidism ?No problems that aware of ?Lab Results  ?Component Value Date  ? TSH 0.235 (L) 02/27/2021  ? ? ? ?4. Stage 3a chronic kidney disease ([place]) ?No problems voiding ?Lab Results  ?Component Value Date  ? CREATININE 1.34 (H) 02/27/2021  ? ? ? ?5. Edema of right lower extremity due to peripheral venous insufficiency ?Has swelling mainly in right lower leg by the end of each day. ? ?6. Age-related osteoporosis without current pathological fracture ?Last dexascan was done 11/30/16. She does not want to do todsay. ? ?7. BMI 28.0-28.9,adult ?Weight is up 3 lbs ?Wt Readings from Last 3 Encounters:  ?05/29/21 153 lb (69.4 kg)  ?03/31/21 150 lb (68 kg)  ?02/27/21 154 lb (69.9 kg)  ? ?BMI Readings from Last 3 Encounters:  ?05/29/21 27.10 kg/m?  ?03/31/21 26.57 kg/m?  ?02/27/21 27.28 kg/m?  ? ? ? ? ?New complaints: ?None today ? ?Allergies  ?Allergen Reactions  ? Ace Inhibitors Cough  ? ?Outpatient  Encounter Medications as of 86/07/2021  ?Medication Sig  ? amLODipine (NORVASC) 10 MG tablet Take 1 tablet (10 mg total) by mouth daily.  ? Cholecalciferol (VITAMIN D3) 1000 units CAPS Take 1 capsule (1,000 Units total) by mouth daily.  ? furosemide (LASIX) 20 MG tablet Take 1 tablet (20 mg total) by mouth daily.  ? levothyroxine (SYNTHROID) 50 MCG tablet Take 1 tablet (50 mcg total) by mouth daily.  ? metoprolol tartrate (LOPRESSOR) 50 MG tablet Take 1 tablet (50 mg total) by mouth 2 (two) times daily.  ? simvastatin (ZOCOR) 20 MG tablet Take 1 tablet (20 mg total) by mouth at bedtime.  ? triamcinolone cream (KENALOG) 0.1 % APPLY TO AFFECTED AREA TWICE DAILY  ? ?No facility-administered encounter medications on file as of 05/29/2021.  ? ? ?Past Surgical History:  ?Procedure Laterality Date  ? APPENDECTOMY    ? TONSILLECTOMY    ? ? ?Family History  ?Problem Relation Age of Onset  ? Heart disease Father   ? ? ? ? ?Controlled substance contract: n/a ? ? ? ? ?Review of Systems  ?Constitutional:  Negative for diaphoresis.  ?Eyes:  Negative for pain.  ?Respiratory:  Negative for shortness of breath.   ?Cardiovascular:  Negative for chest pain, palpitations and leg swelling.  ?Gastrointestinal:  Negative for abdominal pain.  ?Endocrine: Negative for polydipsia.  ?Skin:  Negative for rash.  ?Neurological:  Negative for dizziness, weakness and headaches.  ?Hematological:  Does not bruise/bleed easily.  ?All other systems reviewed and are negative. ? ?   ?Objective:  ? Physical Exam ?Vitals and nursing note reviewed.  ?Constitutional:   ?   General: She is not in acute distress. ?   Appearance: Normal appearance. She is well-developed.  ?HENT:  ?   Head: Normocephalic.  ?   Right Ear: Tympanic membrane normal.  ?   Left Ear: Tympanic membrane normal.  ?   Nose: Nose normal.  ?   Mouth/Throat:  ?   Mouth: Mucous membranes are moist.  ?Eyes:  ?   Pupils: Pupils are equal, round, and reactive to light.  ?Neck:  ?   Vascular: No  carotid bruit or JVD.  ?Cardiovascular:  ?   Rate and Rhythm: Normal rate and regular rhythm.  ?   Heart sounds: Normal heart sounds.  ?Pulmonary:  ?   Effort: Pulmonary effort is normal. No respiratory distress.  ?   Breath sounds: Normal breath sounds. No wheezing or rales.  ?Chest:  ?   Chest wall: No tenderness.  ?Abdominal:  ?   General: Bowel sounds are normal. There is no distension or abdominal bruit.  ?   Palpations: Abdomen is soft. There is no hepatomegaly, splenomegaly, mass or pulsatile mass.  ?   Tenderness: There is no abdominal tenderness.  ?Musculoskeletal:     ?   General: Normal range of motion.  ?   Cervical back: Normal range of motion and neck supple.  ?   Right lower leg: Edema (1+) present.  ?   Left lower leg: No edema.  ?Lymphadenopathy:  ?   Cervical: No cervical adenopathy.  ?Skin: ?   General: Skin is warm and dry.  ?Neurological:  ?   Mental Status: She is alert and oriented to person, place, and time.  ?   Deep Tendon Reflexes: Reflexes are normal and symmetric.  ?Psychiatric:     ?   Behavior: Behavior normal.     ?   Thought Content: Thought content normal.     ?   Judgment: Judgment normal.  ? ? ?BP 134/62   Pulse 68   Temp (!) 97.3 ?F (36.3 ?C) (Temporal)   Resp 20   Ht '5\' 3"'$  (1.6 m)   Wt 153 lb (69.4 kg)   SpO2 97%   BMI 27.10 kg/m?  ? ? ? ? ?   ?Assessment & Plan:  ?Audrey Manning comes in today with chief complaint of Medical Management of Chronic Issues ? ? ?Diagnosis and orders addressed: ? ?1. Primary hypertension ?Low sodium diet ?- metoprolol tartrate (LOPRESSOR) 50 MG tablet; Take 1 tablet (50 mg total) by mouth 2 (two) times daily.  Dispense: 180 tablet; Refill: 1 ?- amLODipine (NORVASC) 10 MG tablet; Take 1 tablet (10 mg total) by mouth daily.  Dispense: 90 tablet; Refill: 1 ? ?2. Mixed hyperlipidemia ?Low fat diet ?- simvastatin (ZOCOR) 20 MG tablet; Take 1 tablet (20 mg total) by mouth at bedtime.  Dispense: 90 tablet; Refill: 1 ?- levothyroxine (SYNTHROID) 50  MCG tablet; Take 1 tablet (50 mcg total) by mouth daily.  Dispense: 90 tablet; Refill: 1 ? ?3. Acquired hypothyroidism ?Labs pending ? ?4. Stage 3a chronic kidney disease ([place]) ?Labs pending ? ?5. Edema of right lower extremity due to peripheral venous insufficiency ?[place] legs when sitting ?- furosemide (LASIX) 20 MG tablet; Take 1 tablet (20 mg total)  by mouth daily.  Dispense: 90 tablet; Refill: 1 ? ?6. Age-related osteoporosis without current pathological fracture ?Weight bearing exercises encouraged ? ?7. BMI 28.0-28.9,adult ?Discussed diet and exercise for person with BMI >25 ?Will recheck weight in 3-6 months ? ? ? ? ?Labs pending ?Health Maintenance reviewed ?Diet and exercise encouraged ? ?Follow up plan: ?6 months ? ? ?[name] Done, FNP ? ? ?

## 2021-05-29 NOTE — Addendum Note (Signed)
Addended by: [name] on: 05/29/2021 12:15 PM ? ? Modules accepted: Orders ? ?

## 2021-05-30 LAB — CMP14+EGFR
ALT: 14 IU/L (ref 0–32)
AST: 27 IU/L (ref 0–40)
Albumin/Globulin Ratio: 1.7 (ref 1.2–2.2)
Albumin: 4.5 g/dL (ref 3.6–4.6)
Alkaline Phosphatase: 79 IU/L (ref 44–121)
BUN/Creatinine Ratio: 20 (ref 12–28)
BUN: 28 mg/dL — ABNORMAL HIGH (ref 8–27)
Bilirubin Total: 0.5 mg/dL (ref 0.0–1.2)
CO2: 21 mmol/L (ref 20–29)
Calcium: 9 mg/dL (ref 8.7–10.3)
Chloride: 101 mmol/L (ref 96–106)
Creatinine, Ser: 1.42 mg/dL — ABNORMAL HIGH (ref 0.57–1.00)
Globulin, Total: 2.6 g/dL (ref 1.5–4.5)
Glucose: 105 mg/dL — ABNORMAL HIGH (ref 70–99)
Potassium: 4.6 mmol/L (ref 3.5–5.2)
Sodium: 138 mmol/L (ref 134–144)
Total Protein: 7.1 g/dL (ref 6.0–8.5)
eGFR: 36 mL/min/{1.73_m2} — ABNORMAL LOW (ref >59)

## 2021-05-30 LAB — CBC WITH DIFFERENTIAL/PLATELET
Basophils Absolute: 0.1 10*3/uL (ref 0.0–0.2)
Basos: 1 %
EOS (ABSOLUTE): 0.1 10*3/uL (ref 0.0–0.4)
Eos: 1 %
Hematocrit: 36.5 % (ref 34.0–46.6)
Hemoglobin: 12.1 g/dL (ref 11.1–15.9)
Immature Grans (Abs): 0 10*3/uL (ref 0.0–0.1)
Immature Granulocytes: 0 %
Lymphocytes Absolute: 2.2 10*3/uL (ref 0.7–3.1)
Lymphs: 36 %
MCH: 31.6 pg (ref 26.6–33.0)
MCHC: 33.2 g/dL (ref 31.5–35.7)
MCV: 95 fL (ref 79–97)
Monocytes Absolute: 0.5 10*3/uL (ref 0.1–0.9)
Monocytes: 9 %
Neutrophils Absolute: 3.2 10*3/uL (ref 1.4–7.0)
Neutrophils: 53 %
Platelets: 161 10*3/uL (ref 150–450)
RBC: 3.83 x10E6/uL (ref 3.77–5.28)
RDW: 12.6 % (ref 11.7–15.4)
WBC: 6 10*3/uL (ref 3.4–10.8)

## 2021-05-30 LAB — LIPID PANEL
Chol/HDL Ratio: 2.2 ratio (ref 0.0–4.4)
Cholesterol, Total: 135 mg/dL (ref 100–199)
HDL: 61 mg/dL (ref >39)
LDL Chol Calc (NIH): 53 mg/dL (ref 0–99)
Triglycerides: 118 mg/dL (ref 0–149)
VLDL Cholesterol Cal: 21 mg/dL (ref 5–40)

## 2021-05-30 LAB — THYROID PANEL WITH TSH
Free Thyroxine Index: 2.5 (ref 1.2–4.9)
T3 Uptake Ratio: 31 % (ref 24–39)
T4, Total: 8.2 ug/dL (ref 4.5–12.0)
TSH: 1.14 u[IU]/mL (ref 0.450–4.500)

## 2021-11-24 ENCOUNTER — Encounter: Payer: Self-pay | Admitting: Nurse Practitioner

## 2021-11-24 ENCOUNTER — Ambulatory Visit (INDEPENDENT_AMBULATORY_CARE_PROVIDER_SITE_OTHER): Payer: Medicare HMO | Admitting: Nurse Practitioner

## 2021-11-24 VITALS — BP 132/62 | HR 73 | Temp 97.1°F | Resp 20 | Ht 63.0 in | Wt 157.0 lb

## 2021-11-24 DIAGNOSIS — N1831 Chronic kidney disease, stage 3a: Secondary | ICD-10-CM

## 2021-11-24 DIAGNOSIS — E782 Mixed hyperlipidemia: Secondary | ICD-10-CM

## 2021-11-24 DIAGNOSIS — I872 Venous insufficiency (chronic) (peripheral): Secondary | ICD-10-CM

## 2021-11-24 DIAGNOSIS — E039 Hypothyroidism, unspecified: Secondary | ICD-10-CM

## 2021-11-24 DIAGNOSIS — Z23 Encounter for immunization: Secondary | ICD-10-CM | POA: Diagnosis not present

## 2021-11-24 DIAGNOSIS — I10 Essential (primary) hypertension: Secondary | ICD-10-CM

## 2021-11-24 DIAGNOSIS — Z6828 Body mass index (BMI) 28.0-28.9, adult: Secondary | ICD-10-CM

## 2021-11-24 DIAGNOSIS — M810 Age-related osteoporosis without current pathological fracture: Secondary | ICD-10-CM

## 2021-11-24 MED ORDER — SIMVASTATIN 20 MG PO TABS: 20.0000 mg | ORAL_TABLET | Freq: Every day | ORAL | 1 refills | Status: DC

## 2021-11-24 MED ORDER — FUROSEMIDE 40 MG PO TABS: 40.0000 mg | ORAL_TABLET | Freq: Every day | ORAL | 3 refills | Status: DC

## 2021-11-24 MED ORDER — LEVOTHYROXINE SODIUM 50 MCG PO TABS: 50.0000 ug | ORAL_TABLET | Freq: Every day | ORAL | 1 refills | Status: DC

## 2021-11-24 MED ORDER — AMLODIPINE BESYLATE 10 MG PO TABS: 10.0000 mg | ORAL_TABLET | Freq: Every day | ORAL | 1 refills | Status: DC

## 2021-11-24 MED ORDER — METOPROLOL TARTRATE 50 MG PO TABS: 50.0000 mg | ORAL_TABLET | Freq: Two times a day (BID) | ORAL | 1 refills | Status: DC

## 2021-11-24 NOTE — Progress Notes (Signed)
Subjective:    Patient ID: Audrey Manning, female    DOB: [birth year], 88 y.o.   MRN: [id number]   Chief Complaint: medical management of chronic issues     HPI:  Audrey Manning is a 88 y.o. who identifies as a female who was assigned female at birth.   Social history: Lives with: her daughter lives with her Work history: retired   Scientist, forensic in today for follow up of the following chronic medical issues:  1. Primary hypertension No c/o chest pain, sob or headache. DOe snot check bloodpressure at home. BP Readings from Last 3 Encounters:  05/29/21 134/62  02/27/21 (!) 142/68  08/27/20 140/63     2. Mixed hyperlipidemia Does not really watch diet and does no dedicated exercise. Lab Results  Component Value Date   CHOL 135 05/29/2021   HDL 61 05/29/2021   LDLCALC 53 05/29/2021   TRIG 118 05/29/2021   CHOLHDL 2.2 05/29/2021     3. Edema of right lower extremity due to peripheral venous insufficiency Has swelling by the end of day.  4. Acquired hypothyroidism No issues that she is aware of. Lab Results  Component Value Date   TSH 1.140 05/29/2021     5. Age-related osteoporosis without current pathological fracture Does no weight bearing exercise. Last dexascan was done on 11/30/16. Patient no longer wants to do.  6. Stage 3a chronic kidney disease (HCC) No voiding issues Lab Results  Component Value Date   CREATININE 1.42 (H) 05/29/2021     7. BMI 28.0-28.9,adult No weight changes Wt Readings from Last 3 Encounters:  11/24/21 157 lb (71.2 kg)  05/29/21 153 lb (69.4 kg)  03/31/21 150 lb (68 kg)   BMI Readings from Last 3 Encounters:  11/24/21 27.81 kg/m  05/29/21 27.10 kg/m  03/31/21 26.57 kg/m     New complaints: None today  Allergies  Allergen Reactions   Ace Inhibitors Cough   Outpatient Encounter Medications as of 11/24/2021  Medication Sig   amLODipine (NORVASC) 10 MG tablet Take 1 tablet (10 mg total) by mouth daily.    Cholecalciferol (VITAMIN D3) 1000 units CAPS Take 1 capsule (1,000 Units total) by mouth daily.   furosemide (LASIX) 20 MG tablet Take 1 tablet (20 mg total) by mouth daily.   levothyroxine (SYNTHROID) 50 MCG tablet Take 1 tablet (50 mcg total) by mouth daily.   metoprolol tartrate (LOPRESSOR) 50 MG tablet Take 1 tablet (50 mg total) by mouth 2 (two) times daily.   simvastatin (ZOCOR) 20 MG tablet Take 1 tablet (20 mg total) by mouth at bedtime.   triamcinolone cream (KENALOG) 0.1 % APPLY TO AFFECTED AREA TWICE DAILY   No facility-administered encounter medications on file as of 11/24/2021.    Past Surgical History:  Procedure Laterality Date   APPENDECTOMY     TONSILLECTOMY      Family History  Problem Relation Age of Onset   Heart disease Father       Controlled substance contract: n/a     Review of Systems  Constitutional:  Negative for diaphoresis.  Eyes:  Negative for pain.  Respiratory:  Negative for shortness of breath.   Cardiovascular:  Negative for chest pain, palpitations and leg swelling.  Gastrointestinal:  Negative for abdominal pain.  Endocrine: Negative for polydipsia.  Skin:  Negative for rash.  Neurological:  Negative for dizziness, weakness and headaches.  Hematological:  Does not bruise/bleed easily.  All other systems reviewed and are negative.      Objective:  Physical Exam Vitals and nursing note reviewed.  Constitutional:      General: She is not in acute distress.    Appearance: Normal appearance. She is well-developed.  HENT:     Head: Normocephalic.     Right Ear: Tympanic membrane normal.     Left Ear: Tympanic membrane normal.     Nose: Nose normal.     Mouth/Throat:     Mouth: Mucous membranes are moist.  Eyes:     Pupils: Pupils are equal, round, and reactive to light.  Neck:     Vascular: No carotid bruit or JVD.  Cardiovascular:     Rate and Rhythm: Normal rate and regular rhythm.     Heart sounds: Normal heart sounds.   Pulmonary:     Effort: Pulmonary effort is normal. No respiratory distress.     Breath sounds: Normal breath sounds. No wheezing or rales.  Chest:     Chest wall: No tenderness.  Abdominal:     General: Bowel sounds are normal. There is no distension or abdominal bruit.     Palpations: Abdomen is soft. There is no hepatomegaly, splenomegaly, mass or pulsatile mass.     Tenderness: There is no abdominal tenderness.  Musculoskeletal:        General: Normal range of motion.     Cervical back: Normal range of motion and neck supple.     Right lower leg: Edema (3+) present.     Left lower leg: Edema (2+) present.  Lymphadenopathy:     Cervical: No cervical adenopathy.  Skin:    General: Skin is warm and dry.  Neurological:     Mental Status: She is alert and oriented to person, place, and time.     Deep Tendon Reflexes: Reflexes are normal and symmetric.  Psychiatric:        Behavior: Behavior normal.        Thought Content: Thought content normal.        Judgment: Judgment normal.     BP 132/62   Pulse 73   Temp (!) 97.1 F (36.2 C) (Temporal)   Resp 20   Ht _0  (1.6 m)   Wt 157 lb (71.2 kg)   SpO2 94%   BMI 27.81 kg/m        Assessment & Plan:   [name] comes in today with chief complaint of Medical Management of Chronic Issues   Diagnosis and orders addressed:  1. Primary hypertension Low sodium diet - metoprolol tartrate (LOPRESSOR) 50 MG tablet; Take 1 tablet (50 mg total) by mouth 2 (two) times daily.  Dispense: 180 tablet; Refill: 1 - amLODipine (NORVASC) 10 MG tablet; Take 1 tablet (10 mg total) by mouth daily.  Dispense: 90 tablet; Refill: 1 - CBC with Differential/Platelet - CMP14+EGFR  2. Mixed hyperlipidemia Low fat diet - levothyroxine (SYNTHROID) 50 MCG tablet; Take 1 tablet (50 mcg total) by mouth daily.  Dispense: 90 tablet; Refill: 1 - simvastatin (ZOCOR) 20 MG tablet; Take 1 tablet (20 mg total) by mouth at bedtime.  Dispense: 90  tablet; Refill: 1 - Lipid panel  3. Edema of right lower extremity due to peripheral venous insufficiency Elevate legs when sitting Increase lasix to 37m daily - furosemide (LASIX) 40 MG tablet; Take 1 tablet (40 mg total) by mouth daily.  Dispense: 30 tablet; Refill: 3  4. Acquired hypothyroidism Labs pending  5. Age-related osteoporosis without current pathological fracture Does ot want to do any more dexascans  6. Stage  3a chronic kidney disease ([place]) Labs pending  7. BMI 28.0-28.9,adult Discussed diet and exercise for person with BMI >25 Will recheck weight in 3-6 months    Labs pending Health Maintenance reviewed Diet and exercise encouraged  Follow up plan: 6 months   [name] Done, FNP

## 2021-11-25 LAB — CMP14+EGFR
ALT: 12 IU/L (ref 0–32)
AST: 25 IU/L (ref 0–40)
Albumin/Globulin Ratio: 1.7 (ref 1.2–2.2)
Albumin: 4.6 g/dL (ref 3.7–4.7)
Alkaline Phosphatase: 80 IU/L (ref 44–121)
BUN/Creatinine Ratio: 18 (ref 12–28)
BUN: 25 mg/dL (ref 8–27)
Bilirubin Total: 0.7 mg/dL (ref 0.0–1.2)
CO2: 19 mmol/L — ABNORMAL LOW (ref 20–29)
Calcium: 9.3 mg/dL (ref 8.7–10.3)
Chloride: 100 mmol/L (ref 96–106)
Creatinine, Ser: 1.36 mg/dL — ABNORMAL HIGH (ref 0.57–1.00)
Globulin, Total: 2.7 g/dL (ref 1.5–4.5)
Glucose: 106 mg/dL — ABNORMAL HIGH (ref 70–99)
Potassium: 4.4 mmol/L (ref 3.5–5.2)
Sodium: 139 mmol/L (ref 134–144)
Total Protein: 7.3 g/dL (ref 6.0–8.5)
eGFR: 38 mL/min/{1.73_m2} — ABNORMAL LOW (ref >59)

## 2021-11-25 LAB — CBC WITH DIFFERENTIAL/PLATELET
Basophils Absolute: 0 10*3/uL (ref 0.0–0.2)
Basos: 1 %
EOS (ABSOLUTE): 0.1 10*3/uL (ref 0.0–0.4)
Eos: 2 %
Hematocrit: 34.9 % (ref 34.0–46.6)
Hemoglobin: 12.3 g/dL (ref 11.1–15.9)
Immature Grans (Abs): 0 10*3/uL (ref 0.0–0.1)
Immature Granulocytes: 0 %
Lymphocytes Absolute: 2.3 10*3/uL (ref 0.7–3.1)
Lymphs: 43 %
MCH: 34.1 pg — ABNORMAL HIGH (ref 26.6–33.0)
MCHC: 35.2 g/dL (ref 31.5–35.7)
MCV: 97 fL (ref 79–97)
Monocytes Absolute: 0.4 10*3/uL (ref 0.1–0.9)
Monocytes: 8 %
Neutrophils Absolute: 2.4 10*3/uL (ref 1.4–7.0)
Neutrophils: 46 %
Platelets: 168 10*3/uL (ref 150–450)
RBC: 3.61 x10E6/uL — ABNORMAL LOW (ref 3.77–5.28)
RDW: 12.7 % (ref 11.7–15.4)
WBC: 5.2 10*3/uL (ref 3.4–10.8)

## 2021-11-25 LAB — LIPID PANEL
Chol/HDL Ratio: 2 ratio (ref 0.0–4.4)
Cholesterol, Total: 128 mg/dL (ref 100–199)
HDL: 65 mg/dL (ref >39)
LDL Chol Calc (NIH): 45 mg/dL (ref 0–99)
Triglycerides: 97 mg/dL (ref 0–149)
VLDL Cholesterol Cal: 18 mg/dL (ref 5–40)

## 2022-02-20 DIAGNOSIS — J101 Influenza due to other identified influenza virus with other respiratory manifestations: Secondary | ICD-10-CM | POA: Diagnosis not present

## 2022-02-20 DIAGNOSIS — J020 Streptococcal pharyngitis: Secondary | ICD-10-CM | POA: Diagnosis not present

## 2022-02-20 DIAGNOSIS — R509 Fever, unspecified: Secondary | ICD-10-CM | POA: Diagnosis not present

## 2022-02-20 DIAGNOSIS — R051 Acute cough: Secondary | ICD-10-CM | POA: Diagnosis not present

## 2022-02-24 ENCOUNTER — Other Ambulatory Visit: Payer: Self-pay | Admitting: *Deleted

## 2022-02-24 DIAGNOSIS — I872 Venous insufficiency (chronic) (peripheral): Secondary | ICD-10-CM

## 2022-02-24 MED ORDER — FUROSEMIDE 40 MG PO TABS: 40.0000 mg | ORAL_TABLET | Freq: Every day | ORAL | 0 refills | Status: DC

## 2022-03-25 ENCOUNTER — Other Ambulatory Visit: Payer: Self-pay | Admitting: Nurse Practitioner

## 2022-03-25 DIAGNOSIS — I872 Venous insufficiency (chronic) (peripheral): Secondary | ICD-10-CM

## 2022-04-20 ENCOUNTER — Telehealth: Payer: Self-pay | Admitting: Nurse Practitioner

## 2022-04-20 NOTE — Telephone Encounter (Signed)
Called patient to schedule Medicare Annual Wellness Visit (AWV). Left message for patient to call back and schedule Medicare Annual Wellness Visit (AWV).  Last date of AWV: 03/31/2021   Please schedule an appointment at any time with either Mickel Baas or [place], [facility]. .  If any questions, please contact me at [phone].  Thank you,  Colletta Maryland,  [facility] Direct Dial ??[phone]

## 2022-05-26 ENCOUNTER — Ambulatory Visit (INDEPENDENT_AMBULATORY_CARE_PROVIDER_SITE_OTHER): Payer: Medicare HMO | Admitting: Nurse Practitioner

## 2022-05-26 ENCOUNTER — Encounter: Payer: Self-pay | Admitting: Nurse Practitioner

## 2022-05-26 VITALS — BP 134/70 | HR 69 | Temp 97.1°F | Resp 20 | Ht 63.0 in | Wt 154.0 lb

## 2022-05-26 DIAGNOSIS — E039 Hypothyroidism, unspecified: Secondary | ICD-10-CM | POA: Diagnosis not present

## 2022-05-26 DIAGNOSIS — M810 Age-related osteoporosis without current pathological fracture: Secondary | ICD-10-CM

## 2022-05-26 DIAGNOSIS — N1831 Chronic kidney disease, stage 3a: Secondary | ICD-10-CM

## 2022-05-26 DIAGNOSIS — I872 Venous insufficiency (chronic) (peripheral): Secondary | ICD-10-CM

## 2022-05-26 DIAGNOSIS — Z0001 Encounter for general adult medical examination with abnormal findings: Secondary | ICD-10-CM | POA: Diagnosis not present

## 2022-05-26 DIAGNOSIS — Z6828 Body mass index (BMI) 28.0-28.9, adult: Secondary | ICD-10-CM

## 2022-05-26 DIAGNOSIS — E782 Mixed hyperlipidemia: Secondary | ICD-10-CM | POA: Diagnosis not present

## 2022-05-26 DIAGNOSIS — I129 Hypertensive chronic kidney disease with stage 1 through stage 4 chronic kidney disease, or unspecified chronic kidney disease: Secondary | ICD-10-CM | POA: Diagnosis not present

## 2022-05-26 DIAGNOSIS — Z0000 Encounter for general adult medical examination without abnormal findings: Secondary | ICD-10-CM

## 2022-05-26 DIAGNOSIS — I10 Essential (primary) hypertension: Secondary | ICD-10-CM | POA: Diagnosis not present

## 2022-05-26 MED ORDER — SIMVASTATIN 20 MG PO TABS
20.0000 mg | ORAL_TABLET | Freq: Every day | ORAL | 1 refills | Status: DC
Start: 2022-05-26 — End: 2022-11-30

## 2022-05-26 MED ORDER — FUROSEMIDE 40 MG PO TABS: 40.0000 mg | ORAL_TABLET | Freq: Every day | ORAL | 1 refills | Status: DC

## 2022-05-26 MED ORDER — AMLODIPINE BESYLATE 10 MG PO TABS
10.0000 mg | ORAL_TABLET | Freq: Every day | ORAL | 1 refills | Status: DC
Start: 2022-05-26 — End: 2022-11-30

## 2022-05-26 MED ORDER — METOPROLOL TARTRATE 50 MG PO TABS: 50.0000 mg | ORAL_TABLET | Freq: Two times a day (BID) | ORAL | 1 refills | Status: DC

## 2022-05-26 MED ORDER — LEVOTHYROXINE SODIUM 50 MCG PO TABS: 50.0000 ug | ORAL_TABLET | Freq: Every day | ORAL | 1 refills | Status: DC

## 2022-05-26 NOTE — Patient Instructions (Signed)
Fall Prevention in the Home, Adult Falls can cause injuries and can happen to people of all ages. There are many things you can do to make your home safer and to help prevent falls. What actions can I take to prevent falls? General information Use good lighting in all rooms. Make sure to: Replace any light bulbs that burn out. Turn on the lights in dark areas and use night-lights. Keep items that you use often in easy-to-reach places. Lower the shelves around your home if needed. Move furniture so that there are clear paths around it. Do not use throw rugs or other things on the floor that can make you trip. If any of your floors are uneven, fix them. Add color or contrast paint or tape to clearly mark and help you see: Grab bars or handrails. First and last steps of staircases. Where the edge of each step is. If you use a ladder or stepladder: Make sure that it is fully opened. Do not climb a closed ladder. Make sure the sides of the ladder are locked in place. Have someone hold the ladder while you use it. Know where your pets are as you move through your home. What can I do in the bathroom?     Keep the floor dry. Clean up any water on the floor right away. Remove soap buildup in the bathtub or shower. Buildup makes bathtubs and showers slippery. Use non-skid mats or decals on the floor of the bathtub or shower. Attach bath mats securely with double-sided, non-slip rug tape. If you need to sit down in the shower, use a non-slip stool. Install grab bars by the toilet and in the bathtub and shower. Do not use towel bars as grab bars. What can I do in the bedroom? Make sure that you have a light by your bed that is easy to reach. Do not use any sheets or blankets on your bed that hang to the floor. Have a firm chair or bench with side arms that you can use for support when you get dressed. What can I do in the kitchen? Clean up any spills right away. If you need to reach something  above you, use a step stool with a grab bar. Keep electrical cords out of the way. Do not use floor polish or wax that makes floors slippery. What can I do with my stairs? Do not leave anything on the stairs. Make sure that you have a light switch at the top and the bottom of the stairs. Make sure that there are handrails on both sides of the stairs. Fix handrails that are broken or loose. Install non-slip stair treads on all your stairs if they do not have carpet. Avoid having throw rugs at the top or bottom of the stairs. Choose a carpet that does not hide the edge of the steps on the stairs. Make sure that the carpet is firmly attached to the stairs. Fix carpet that is loose or worn. What can I do on the outside of my home? Use bright outdoor lighting. Fix the edges of walkways and driveways and fix any cracks. Clear paths of anything that can make you trip, such as tools or rocks. Add color or contrast paint or tape to clearly mark and help you see anything that might make you trip as you walk through a door, such as a raised step or threshold. Trim any bushes or trees on paths to your home. Check to see if handrails are loose   or broken and that both sides of all steps have handrails. Install guardrails along the edges of any raised decks and porches. Have leaves, snow, or ice cleared regularly. Use sand, salt, or ice melter on paths if you live where there is ice and snow during the winter. Clean up any spills in your garage right away. This includes grease or oil spills. What other actions can I take? Review your medicines with your doctor. Some medicines can cause dizziness or changes in blood pressure, which increase your risk of falling. Wear shoes that: Have a low heel. Do not wear high heels. Have rubber bottoms and are closed at the toe. Feel good on your feet and fit well. Use tools that help you move around if needed. These include: Canes. Walkers. Scooters. Crutches. Ask  your doctor what else you can do to help prevent falls. This may include seeing a physical therapist to learn to do exercises to move better and get stronger. Where to find more information Centers for Disease Control and Prevention, [employer]: cdc.gov National Institute on Aging: nia.nih.gov National Institute on Aging: nia.nih.gov Contact a doctor if: You are afraid of falling at home. You feel weak, drowsy, or dizzy at home. You fall at home. Get help right away if you: Lose consciousness or have trouble moving after a fall. Have a fall that causes a head injury. These symptoms may be an emergency. Get help right away. Call 911. Do not wait to see if the symptoms will go away. Do not drive yourself to the hospital. This information is not intended to replace advice given to you by your health care provider. Make sure you discuss any questions you have with your health care provider. Document Revised: 10/13/2021 Document Reviewed: 10/13/2021 Elsevier Patient Education  2023 Elsevier Inc.  

## 2022-05-26 NOTE — Progress Notes (Signed)
Subjective:    Patient ID: Audrey Manning, female    DOB: September 19, 1932, 89 y.o.   MRN: [id number]   Chief Complaint: annual physical   HPI:  Audrey Manning is a 89 y.o. who identifies as a female who was assigned female at birth.   Social history: Lives with: her daughter lives with her Work history: retired   Scientist, forensic in today for follow up of the following chronic medical issues:  1. Primary hypertension No c/o chest pain, sob or headache. Doe nsot check blood pressure at home. BP Readings from Last 3 Encounters:  05/26/22 (!) 156/72  11/24/21 132/62  05/29/21 134/62     2. Mixed hyperlipidemia Does not really watch diet and does no dedicated exercise.' Lab Results  Component Value Date   CHOL 128 11/24/2021   HDL 65 11/24/2021   LDLCALC 45 11/24/2021   TRIG 97 11/24/2021   CHOLHDL 2.0 11/24/2021     3. Edema of right lower extremity due to peripheral venous insufficiency Has daily lower ext edema.  4. Stage 3a chronic kidney disease No voiding issues Lab Results  Component Value Date   CREATININE 1.36 (H) 11/24/2021     5. Acquired hypothyroidism No issues that she is aware of Lab Results  Component Value Date   TSH 1.140 05/29/2021     6. Age-related osteoporosis without current pathological fracture Last dexascan was done 2018. She no longer wants to repeat these.  7. BMI 28.0-28.9,adult Weight is down 3 lbs Wt Readings from Last 3 Encounters:  05/26/22 154 lb (69.9 kg)  11/24/21 157 lb (71.2 kg)  05/29/21 153 lb (69.4 kg)   BMI Readings from Last 3 Encounters:  05/26/22 27.28 kg/m  11/24/21 27.81 kg/m  05/29/21 27.10 kg/m      New complaints: None today  Allergies  Allergen Reactions   Ace Inhibitors Cough   Outpatient Encounter Medications as of 05/26/2022  Medication Sig   amLODipine (NORVASC) 10 MG tablet Take 1 tablet (10 mg total) by mouth daily.   Cholecalciferol (VITAMIN D3) 1000 units CAPS Take 1 capsule (1,000 Units  total) by mouth daily.   furosemide (LASIX) 40 MG tablet Take 1 tablet (40 mg total) by mouth daily.   levothyroxine (SYNTHROID) 50 MCG tablet Take 1 tablet (50 mcg total) by mouth daily.   metoprolol tartrate (LOPRESSOR) 50 MG tablet Take 1 tablet (50 mg total) by mouth 2 (two) times daily.   simvastatin (ZOCOR) 20 MG tablet Take 1 tablet (20 mg total) by mouth at bedtime.   triamcinolone cream (KENALOG) 0.1 % APPLY TO AFFECTED AREA TWICE DAILY   No facility-administered encounter medications on file as of 05/26/2022.    Past Surgical History:  Procedure Laterality Date   APPENDECTOMY     TONSILLECTOMY      Family History  Problem Relation Age of Onset   Heart disease Father       Controlled substance contract: n/a     Review of Systems  Constitutional:  Negative for diaphoresis.  Eyes:  Negative for pain.  Respiratory:  Negative for shortness of breath.   Cardiovascular:  Negative for chest pain, palpitations and leg swelling.  Gastrointestinal:  Negative for abdominal pain.  Endocrine: Negative for polydipsia.  Skin:  Negative for rash.  Neurological:  Negative for dizziness, weakness and headaches.  Hematological:  Does not bruise/bleed easily.  All other systems reviewed and are negative.      Objective:   Physical Exam Vitals and nursing note reviewed.  Constitutional:      General: She is not in acute distress.    Appearance: Normal appearance. She is well-developed.  HENT:     Head: Normocephalic.     Right Ear: Tympanic membrane normal.     Left Ear: Tympanic membrane normal.     Nose: Nose normal.     Mouth/Throat:     Mouth: Mucous membranes are moist.  Eyes:     Pupils: Pupils are equal, round, and reactive to light.  Neck:     Vascular: No carotid bruit or JVD.  Cardiovascular:     Rate and Rhythm: Normal rate and regular rhythm.     Heart sounds: Normal heart sounds.  Pulmonary:     Effort: Pulmonary effort is normal. No respiratory distress.      Breath sounds: Normal breath sounds. No wheezing or rales.  Chest:     Chest wall: No tenderness.  Abdominal:     General: Bowel sounds are normal. There is no distension or abdominal bruit.     Palpations: Abdomen is soft. There is no hepatomegaly, splenomegaly, mass or pulsatile mass.     Tenderness: There is no abdominal tenderness.  Musculoskeletal:        General: Normal range of motion.     Cervical back: Normal range of motion and neck supple.     Right lower leg: Edema (1+) present.     Left lower leg: Edema (1+) present.  Lymphadenopathy:     Cervical: Cervical adenopathy present.  Skin:    General: Skin is warm and dry.     Coloration: Skin is pale.  Neurological:     Mental Status: She is alert and oriented to person, place, and time.     Deep Tendon Reflexes: Reflexes are normal and symmetric.  Psychiatric:        Behavior: Behavior normal.        Thought Content: Thought content normal.        Judgment: Judgment normal.    BP 134/70   Pulse 69   Temp (!) 97.1 F (36.2 C) (Temporal)   Resp 20   Ht 5\' 3"  (1.6 m)   Wt 154 lb (69.9 kg)   SpO2 98%   BMI 27.28 kg/m          Assessment & Plan:  Audrey Manning comes in today with chief complaint of Medical Management of Chronic Issues   Diagnosis and orders addressed:  1. Annual physical exam   2. Primary hypertension Low sodium diet - amLODipine (NORVASC) 10 MG tablet; Take 1 tablet (10 mg total) by mouth daily.  Dispense: 90 tablet; Refill: 1 - metoprolol tartrate (LOPRESSOR) 50 MG tablet; Take 1 tablet (50 mg total) by mouth 2 (two) times daily.  Dispense: 180 tablet; Refill: 1 - CBC with Differential/Platelet - CMP14+EGFR  3. Mixed hyperlipidemia Low fat diet - levothyroxine (SYNTHROID) 50 MCG tablet; Take 1 tablet (50 mcg total) by mouth daily.  Dispense: 90 tablet; Refill: 1 - simvastatin (ZOCOR) 20 MG tablet; Take 1 tablet (20 mg total) by mouth at bedtime.  Dispense: 90 tablet; Refill:  1 - Lipid panel  4. Edema of right lower extremity due to peripheral venous insufficiency Elevate legs when sitting - furosemide (LASIX) 40 MG tablet; Take 1 tablet (40 mg total) by mouth daily.  Dispense: 90 tablet; Refill: 1  5. Stage 3a chronic kidney disease Labs pending  6. Acquired hypothyroidism Labs pending  7. Age-related osteoporosis without current pathological fracture Eight  bearing exercises  8. BMI 28.0-28.9,adult Discussed diet and exercise for person with BMI >25 Will recheck weight in 3-6 months    Labs pending Health Maintenance reviewed Diet and exercise encouraged  Follow up plan: 6 months   [name] Done, FNP

## 2022-05-27 LAB — CBC WITH DIFFERENTIAL/PLATELET
Basophils Absolute: 0.1 10*3/uL (ref 0.0–0.2)
Basos: 1 %
EOS (ABSOLUTE): 0.1 10*3/uL (ref 0.0–0.4)
Eos: 2 %
Hematocrit: 35.8 % (ref 34.0–46.6)
Hemoglobin: 12.2 g/dL (ref 11.1–15.9)
Immature Grans (Abs): 0 10*3/uL (ref 0.0–0.1)
Immature Granulocytes: 0 %
Lymphocytes Absolute: 1.9 10*3/uL (ref 0.7–3.1)
Lymphs: 40 %
MCH: 33 pg (ref 26.6–33.0)
MCHC: 34.1 g/dL (ref 31.5–35.7)
MCV: 97 fL (ref 79–97)
Monocytes Absolute: 0.4 10*3/uL (ref 0.1–0.9)
Monocytes: 9 %
Neutrophils Absolute: 2.4 10*3/uL (ref 1.4–7.0)
Neutrophils: 48 %
Platelets: 144 10*3/uL — ABNORMAL LOW (ref 150–450)
RBC: 3.7 x10E6/uL — ABNORMAL LOW (ref 3.77–5.28)
RDW: 12.5 % (ref 11.7–15.4)
WBC: 4.9 10*3/uL (ref 3.4–10.8)

## 2022-05-27 LAB — CMP14+EGFR
ALT: 10 IU/L (ref 0–32)
AST: 23 IU/L (ref 0–40)
Albumin/Globulin Ratio: 1.4 (ref 1.2–2.2)
Albumin: 4.6 g/dL (ref 3.7–4.7)
Alkaline Phosphatase: 92 IU/L (ref 44–121)
BUN/Creatinine Ratio: 26 (ref 12–28)
BUN: 39 mg/dL — ABNORMAL HIGH (ref 8–27)
Bilirubin Total: 0.6 mg/dL (ref 0.0–1.2)
CO2: 22 mmol/L (ref 20–29)
Calcium: 9.4 mg/dL (ref 8.7–10.3)
Chloride: 102 mmol/L (ref 96–106)
Creatinine, Ser: 1.52 mg/dL — ABNORMAL HIGH (ref 0.57–1.00)
Globulin, Total: 3.2 g/dL (ref 1.5–4.5)
Glucose: 98 mg/dL (ref 70–99)
Potassium: 4.3 mmol/L (ref 3.5–5.2)
Sodium: 141 mmol/L (ref 134–144)
Total Protein: 7.8 g/dL (ref 6.0–8.5)
eGFR: 33 mL/min/{1.73_m2} — ABNORMAL LOW (ref >59)

## 2022-05-27 LAB — LIPID PANEL
Chol/HDL Ratio: 2.3 ratio (ref 0.0–4.4)
Cholesterol, Total: 152 mg/dL (ref 100–199)
HDL: 66 mg/dL (ref >39)
LDL Chol Calc (NIH): 63 mg/dL (ref 0–99)
Triglycerides: 132 mg/dL (ref 0–149)
VLDL Cholesterol Cal: 23 mg/dL (ref 5–40)

## 2022-06-08 ENCOUNTER — Telehealth: Payer: Self-pay | Admitting: Nurse Practitioner

## 2022-06-08 NOTE — Telephone Encounter (Signed)
Contacted Audrey Manning to schedule their annual wellness visit. Appointment made for 06/15/2022.  Thank you,  Judeth Cornfield,  AMB Clinical Support [facility] AWV Program Direct Dial ??[phone]

## 2022-06-15 ENCOUNTER — Ambulatory Visit (INDEPENDENT_AMBULATORY_CARE_PROVIDER_SITE_OTHER): Payer: Medicare HMO

## 2022-06-15 VITALS — Ht 63.0 in | Wt 154.0 lb

## 2022-06-15 DIAGNOSIS — Z0000 Encounter for general adult medical examination without abnormal findings: Secondary | ICD-10-CM | POA: Diagnosis not present
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
## 2022-06-15 NOTE — Patient Instructions (Signed)
Audrey Manning , Thank you for taking time to come for your Medicare Wellness Visit. I appreciate your ongoing commitment to your health goals. Please review the following plan we discussed and let me know if I can assist you in the future.   These are the goals we discussed:  Goals      awv     01/31/2020 AWV Goal: Fall Prevention  Over the next year, patient will decrease their risk for falls by: Using assistive devices, such as a cane or walker, as needed Identifying fall risks within their home and correcting them by: Removing throw rugs Adding handrails to stairs or ramps Removing clutter and keeping a clear pathway throughout the home Increasing light, especially at night Adding shower handles/bars Raising toilet seat Identifying potential personal risk factors for falls: Medication side effects Incontinence/urgency Vestibular dysfunction Hearing loss Musculoskeletal disorders Neurological disorders Orthostatic hypotension       DIET - INCREASE WATER INTAKE     Try to drink 6-8 glasses of water daily     Exercise 3x per week (30 min per time)     Do chair exercises 3 times per week for 15-20 minutes per session   (the chair exercises reviewed today are a good option)        This is a list of the screening recommended for you and due dates:  Health Maintenance  Topic Date Due   COVID-19 Vaccine (1) Never done   DTaP/Tdap/Td vaccine (3 - Td or Tdap) 11/23/2020   Zoster (Shingles) Vaccine (1 of 2) 08/25/2022*   Flu Shot  09/24/2022   Medicare Annual Wellness Visit  06/15/2023   Pneumonia Vaccine  Completed   DEXA scan (bone density measurement)  Completed   HPV Vaccine  Aged Out   Mammogram  Discontinued  *Topic was postponed. The date shown is not the original due date.    Advanced directives: Advance directive discussed with you today. I have provided a copy for you to complete at home and have notarized. Once this is complete please bring a copy in to our  office so we can scan it into your chart.   Conditions/risks identified: Aim for 30 minutes of exercise or brisk walking, 6-8 glasses of water, and 5 servings of fruits and vegetables each day.   Next appointment: Follow up in one year for your annual wellness visit    Preventive Care 65 Years and Older, Female Preventive care refers to lifestyle choices and visits with your health care provider that can promote health and wellness. What does preventive care include? A yearly physical exam. This is also called an annual well check. Dental exams once or twice a year. Routine eye exams. Ask your health care provider how often you should have your eyes checked. Personal lifestyle choices, including: Daily care of your teeth and gums. Regular physical activity. Eating a healthy diet. Avoiding tobacco and drug use. Limiting alcohol use. Practicing safe sex. Taking low-dose aspirin every day. Taking vitamin and mineral supplements as recommended by your health care provider. What happens during an annual well check? The services and screenings done by your health care provider during your annual well check will depend on your age, overall health, lifestyle risk factors, and family history of disease. Counseling  Your health care provider may ask you questions about your: Alcohol use. Tobacco use. Drug use. Emotional well-being. Home and relationship well-being. Sexual activity. Eating habits. History of falls. Memory and ability to understand (cognition). Work and work  environment. Reproductive health. Screening  You may have the following tests or measurements: Height, weight, and BMI. Blood pressure. Lipid and cholesterol levels. These may be checked every 5 years, or more frequently if you are over 64 years old. Skin check. Lung cancer screening. You may have this screening every year starting at age 43 if you have a 30-pack-year history of smoking and currently smoke or have  quit within the past 15 years. Fecal occult blood test (FOBT) of the stool. You may have this test every year starting at age 47. Flexible sigmoidoscopy or colonoscopy. You may have a sigmoidoscopy every 5 years or a colonoscopy every 10 years starting at age 78. Hepatitis C blood test. Hepatitis B blood test. Sexually transmitted disease (STD) testing. Diabetes screening. This is done by checking your blood sugar (glucose) after you have not eaten for a while (fasting). You may have this done every 1-3 years. Bone density scan. This is done to screen for osteoporosis. You may have this done starting at age 46. Mammogram. This may be done every 1-2 years. Talk to your health care provider about how often you should have regular mammograms. Talk with your health care provider about your test results, treatment options, and if necessary, the need for more tests. Vaccines  Your health care provider may recommend certain vaccines, such as: Influenza vaccine. This is recommended every year. Tetanus, diphtheria, and acellular pertussis (Tdap, Td) vaccine. You may need a Td booster every 10 years. Zoster vaccine. You may need this after age 62. Pneumococcal 13-valent conjugate (PCV13) vaccine. One dose is recommended after age 29. Pneumococcal polysaccharide (PPSV23) vaccine. One dose is recommended after age 87. Talk to your health care provider about which screenings and vaccines you need and how often you need them. This information is not intended to replace advice given to you by your health care provider. Make sure you discuss any questions you have with your health care provider. Document Released: 03/08/2015 Document Revised: 10/30/2015 Document Reviewed: 12/11/2014 Elsevier Interactive Patient Education  2017 [employer].  Fall Prevention in the Home Falls can cause injuries. They can happen to people of all ages. There are many things you can do to make your home safe and to help prevent  falls. What can I do on the outside of my home? Regularly fix the edges of walkways and driveways and fix any cracks. Remove anything that might make you trip as you walk through a door, such as a raised step or threshold. Trim any bushes or trees on the path to your home. Use bright outdoor lighting. Clear any walking paths of anything that might make someone trip, such as rocks or tools. Regularly check to see if handrails are loose or broken. Make sure that both sides of any steps have handrails. Any raised decks and porches should have guardrails on the edges. Have any leaves, snow, or ice cleared regularly. Use sand or salt on walking paths during winter. Clean up any spills in your garage right away. This includes oil or grease spills. What can I do in the bathroom? Use night lights. Install grab bars by the toilet and in the tub and shower. Do not use towel bars as grab bars. Use non-skid mats or decals in the tub or shower. If you need to sit down in the shower, use a plastic, non-slip stool. Keep the floor dry. Clean up any water that spills on the floor as soon as it happens. Remove soap buildup in  the tub or shower regularly. Attach bath mats securely with double-sided non-slip rug tape. Do not have throw rugs and other things on the floor that can make you trip. What can I do in the bedroom? Use night lights. Make sure that you have a light by your bed that is easy to reach. Do not use any sheets or blankets that are too big for your bed. They should not hang down onto the floor. Have a firm chair that has side arms. You can use this for support while you get dressed. Do not have throw rugs and other things on the floor that can make you trip. What can I do in the kitchen? Clean up any spills right away. Avoid walking on wet floors. Keep items that you use a lot in easy-to-reach places. If you need to reach something above you, use a strong step stool that has a grab  bar. Keep electrical cords out of the way. Do not use floor polish or wax that makes floors slippery. If you must use wax, use non-skid floor wax. Do not have throw rugs and other things on the floor that can make you trip. What can I do with my stairs? Do not leave any items on the stairs. Make sure that there are handrails on both sides of the stairs and use them. Fix handrails that are broken or loose. Make sure that handrails are as long as the stairways. Check any carpeting to make sure that it is firmly attached to the stairs. Fix any carpet that is loose or worn. Avoid having throw rugs at the top or bottom of the stairs. If you do have throw rugs, attach them to the floor with carpet tape. Make sure that you have a light switch at the top of the stairs and the bottom of the stairs. If you do not have them, ask someone to add them for you. What else can I do to help prevent falls? Wear shoes that: Do not have high heels. Have rubber bottoms. Are comfortable and fit you well. Are closed at the toe. Do not wear sandals. If you use a stepladder: Make sure that it is fully opened. Do not climb a closed stepladder. Make sure that both sides of the stepladder are locked into place. Ask someone to hold it for you, if possible. Clearly mark and make sure that you can see: Any grab bars or handrails. First and last steps. Where the edge of each step is. Use tools that help you move around (mobility aids) if they are needed. These include: Canes. Walkers. Scooters. Crutches. Turn on the lights when you go into a dark area. Replace any light bulbs as soon as they burn out. Set up your furniture so you have a clear path. Avoid moving your furniture around. If any of your floors are uneven, fix them. If there are any pets around you, be aware of where they are. Review your medicines with your doctor. Some medicines can make you feel dizzy. This can increase your chance of falling. Ask  your doctor what other things that you can do to help prevent falls. This information is not intended to replace advice given to you by your health care provider. Make sure you discuss any questions you have with your health care provider. Document Released: 12/06/2008 Document Revised: 07/18/2015 Document Reviewed: 03/16/2014 Elsevier Interactive Patient Education  2017 [employer].

## 2022-11-10 ENCOUNTER — Other Ambulatory Visit: Payer: Self-pay | Admitting: Nurse Practitioner

## 2022-11-10 DIAGNOSIS — I10 Essential (primary) hypertension: Secondary | ICD-10-CM

## 2022-11-30 ENCOUNTER — Ambulatory Visit: Payer: Medicare HMO | Admitting: Nurse Practitioner

## 2022-11-30 ENCOUNTER — Encounter: Payer: Self-pay | Admitting: Nurse Practitioner

## 2022-11-30 VITALS — BP 146/71 | HR 65 | Temp 96.9°F | Resp 20 | Ht 63.0 in | Wt 155.0 lb

## 2022-11-30 DIAGNOSIS — Z6828 Body mass index (BMI) 28.0-28.9, adult: Secondary | ICD-10-CM

## 2022-11-30 DIAGNOSIS — M810 Age-related osteoporosis without current pathological fracture: Secondary | ICD-10-CM

## 2022-11-30 DIAGNOSIS — I10 Essential (primary) hypertension: Secondary | ICD-10-CM

## 2022-11-30 DIAGNOSIS — N1831 Chronic kidney disease, stage 3a: Secondary | ICD-10-CM | POA: Diagnosis not present

## 2022-11-30 DIAGNOSIS — I872 Venous insufficiency (chronic) (peripheral): Secondary | ICD-10-CM

## 2022-11-30 DIAGNOSIS — Z23 Encounter for immunization: Secondary | ICD-10-CM

## 2022-11-30 DIAGNOSIS — E039 Hypothyroidism, unspecified: Secondary | ICD-10-CM

## 2022-11-30 DIAGNOSIS — E782 Mixed hyperlipidemia: Secondary | ICD-10-CM | POA: Diagnosis not present

## 2022-11-30 MED ORDER — AMLODIPINE BESYLATE 10 MG PO TABS
10.0000 mg | ORAL_TABLET | Freq: Every day | ORAL | 1 refills | Status: DC
Start: 2022-11-30 — End: 2023-05-28

## 2022-11-30 MED ORDER — FUROSEMIDE 40 MG PO TABS: 40.0000 mg | ORAL_TABLET | Freq: Every day | ORAL | 1 refills | Status: DC

## 2022-11-30 MED ORDER — LEVOTHYROXINE SODIUM 50 MCG PO TABS
50.0000 ug | ORAL_TABLET | Freq: Every day | ORAL | 1 refills | Status: DC
Start: 2022-11-30 — End: 2023-05-28

## 2022-11-30 MED ORDER — SIMVASTATIN 20 MG PO TABS
20.0000 mg | ORAL_TABLET | Freq: Every day | ORAL | 1 refills | Status: DC
Start: 2022-11-30 — End: 2023-05-28

## 2022-11-30 MED ORDER — VITAMIN D3 25 MCG (1000 UT) PO CAPS: 1.0000 | ORAL_CAPSULE | Freq: Every day | ORAL | 5 refills | Status: AC

## 2022-11-30 MED ORDER — METOPROLOL TARTRATE 50 MG PO TABS
50.0000 mg | ORAL_TABLET | Freq: Two times a day (BID) | ORAL | 1 refills | Status: DC
Start: 2022-11-30 — End: 2023-05-28

## 2022-11-30 NOTE — Patient Instructions (Signed)
Fall Prevention in the Home, Adult Falls can cause injuries and can happen to people of all ages. There are many things you can do to make your home safer and to help prevent falls. What actions can I take to prevent falls? General information Use good lighting in all rooms. Make sure to: Replace any light bulbs that burn out. Turn on the lights in dark areas and use night-lights. Keep items that you use often in easy-to-reach places. Lower the shelves around your home if needed. Move furniture so that there are clear paths around it. Do not use throw rugs or other things on the floor that can make you trip. If any of your floors are uneven, fix them. Add color or contrast paint or tape to clearly mark and help you see: Grab bars or handrails. First and last steps of staircases. Where the edge of each step is. If you use a ladder or stepladder: Make sure that it is fully opened. Do not climb a closed ladder. Make sure the sides of the ladder are locked in place. Have someone hold the ladder while you use it. Know where your pets are as you move through your home. What can I do in the bathroom?     Keep the floor dry. Clean up any water on the floor right away. Remove soap buildup in the bathtub or shower. Buildup makes bathtubs and showers slippery. Use non-skid mats or decals on the floor of the bathtub or shower. Attach bath mats securely with double-sided, non-slip rug tape. If you need to sit down in the shower, use a non-slip stool. Install grab bars by the toilet and in the bathtub and shower. Do not use towel bars as grab bars. What can I do in the bedroom? Make sure that you have a light by your bed that is easy to reach. Do not use any sheets or blankets on your bed that hang to the floor. Have a firm chair or bench with side arms that you can use for support when you get dressed. What can I do in the kitchen? Clean up any spills right away. If you need to reach something  above you, use a step stool with a grab bar. Keep electrical cords out of the way. Do not use floor polish or wax that makes floors slippery. What can I do with my stairs? Do not leave anything on the stairs. Make sure that you have a light switch at the top and the bottom of the stairs. Make sure that there are handrails on both sides of the stairs. Fix handrails that are broken or loose. Install non-slip stair treads on all your stairs if they do not have carpet. Avoid having throw rugs at the top or bottom of the stairs. Choose a carpet that does not hide the edge of the steps on the stairs. Make sure that the carpet is firmly attached to the stairs. Fix carpet that is loose or worn. What can I do on the outside of my home? Use bright outdoor lighting. Fix the edges of walkways and driveways and fix any cracks. Clear paths of anything that can make you trip, such as tools or rocks. Add color or contrast paint or tape to clearly mark and help you see anything that might make you trip as you walk through a door, such as a raised step or threshold. Trim any bushes or trees on paths to your home. Check to see if handrails are loose   or broken and that both sides of all steps have handrails. Install guardrails along the edges of any raised decks and porches. Have leaves, snow, or ice cleared regularly. Use sand, salt, or ice melter on paths if you live where there is ice and snow during the winter. Clean up any spills in your garage right away. This includes grease or oil spills. What other actions can I take? Review your medicines with your doctor. Some medicines can cause dizziness or changes in blood pressure, which increase your risk of falling. Wear shoes that: Have a low heel. Do not wear high heels. Have rubber bottoms and are closed at the toe. Feel good on your feet and fit well. Use tools that help you move around if needed. These include: Canes. Walkers. Scooters. Crutches. Ask  your doctor what else you can do to help prevent falls. This may include seeing a physical therapist to learn to do exercises to move better and get stronger. Where to find more information Centers for Disease Control and Prevention, STEADI: cdc.gov National Institute on Aging: nia.nih.gov National Institute on Aging: nia.nih.gov Contact a doctor if: You are afraid of falling at home. You feel weak, drowsy, or dizzy at home. You fall at home. Get help right away if you: Lose consciousness or have trouble moving after a fall. Have a fall that causes a head injury. These symptoms may be an emergency. Get help right away. Call 911. Do not wait to see if the symptoms will go away. Do not drive yourself to the hospital. This information is not intended to replace advice given to you by your health care provider. Make sure you discuss any questions you have with your health care provider. Document Revised: 10/13/2021 Document Reviewed: 10/13/2021 Elsevier Patient Education  2024 Elsevier Inc.  

## 2022-11-30 NOTE — Progress Notes (Signed)
Subjective:    Patient ID: Audrey Manning, female    DOB: 20-Aug-1932, 89 y.o.   MRN: [id number]   Chief Complaint: medical management of chronic issues     HPI:  Audrey Manning is a 89 y.o. who identifies as a female who was assigned female at birth.   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 Primary hypertension No c/o chest pain, sob or headache. Doe snot check blood pressure at home. BP Readings from Last 3 Encounters:  05/26/22 134/70  11/24/21 132/62  05/29/21 134/62     2. Mixed hyperlipidemia Does not watch diet and does no dedicated exercise. Lab Results  Component Value Date   CHOL 152 05/26/2022   HDL 66 05/26/2022   LDLCALC 63 05/26/2022   TRIG 132 05/26/2022   CHOLHDL 2.3 05/26/2022     3. Stage 3a chronic kidney disease (HCC) No voiding issues Lab Results  Component Value Date   CREATININE 1.52 (H) 05/26/2022     4. Acquired hypothyroidism No issues that she is aware of. Lab Results  Component Value Date   TSH 1.140 05/29/2021     5. Age-related osteoporosis without current pathological fracture Last dexascan was done on 11/30/16. Patient has refused repeat.  6. BMI 28.0-28.9,adult No recent weight changes Wt Readings from Last 3 Encounters:  11/30/22 155 lb (70.3 kg)  06/15/22 154 lb (69.9 kg)  05/26/22 154 lb (69.9 kg)   BMI Readings from Last 3 Encounters:  11/30/22 27.46 kg/m  06/15/22 27.28 kg/m  05/26/22 27.28 kg/m      New complaints: None today  Allergies  Allergen Reactions   Ace Inhibitors Cough   Outpatient Encounter Medications as of 11/30/2022  Medication Sig   amLODipine (NORVASC) 10 MG tablet Take 1 tablet (10 mg total) by mouth daily.   Cholecalciferol (VITAMIN D3) 1000 units CAPS Take 1 capsule (1,000 Units total) by mouth daily.   furosemide (LASIX) 40 MG tablet Take 1  tablet (40 mg total) by mouth daily.   levothyroxine (SYNTHROID) 50 MCG tablet Take 1 tablet (50 mcg total) by mouth daily.   metoprolol tartrate (LOPRESSOR) 50 MG tablet Take 1 tablet by mouth twice daily   simvastatin (ZOCOR) 20 MG tablet Take 1 tablet (20 mg total) by mouth at bedtime.   triamcinolone cream (KENALOG) 0.1 % APPLY TO AFFECTED AREA TWICE DAILY   No facility-administered encounter medications on file as of 11/30/2022.    Past Surgical History:  Procedure Laterality Date   APPENDECTOMY     TONSILLECTOMY      Family History  Problem Relation Age of Onset   Heart disease Father       Controlled substance contract: n/a     Review of Systems  Constitutional:  Negative for diaphoresis.  Eyes:  Negative for pain.  Respiratory:  Negative for shortness of breath.   Cardiovascular:  Negative for chest pain, palpitations and leg swelling.  Gastrointestinal:  Negative for abdominal pain.  Endocrine: Negative for polydipsia.  Skin:  Negative for rash.  Neurological:  Negative for dizziness, weakness and headaches.  Hematological:  Does not bruise/bleed easily.  All other systems reviewed and are negative.      Objective:   Physical Exam Vitals and nursing note reviewed.  Constitutional:      General: She is not in acute distress.    Appearance:  Normal appearance. She is well-developed.  HENT:     Head: Normocephalic.     Right Ear: Tympanic membrane normal.     Left Ear: Tympanic membrane normal.     Nose: Nose normal.     Mouth/Throat:     Mouth: Mucous membranes are moist.  Eyes:     Pupils: Pupils are equal, round, and reactive to light.  Neck:     Vascular: No carotid bruit or JVD.  Cardiovascular:     Rate and Rhythm: Normal rate and regular rhythm.     Heart sounds: Normal heart sounds.  Pulmonary:     Effort: Pulmonary effort is normal. No respiratory distress.     Breath sounds: Normal breath sounds. No wheezing or rales.  Chest:     Chest  wall: No tenderness.  Abdominal:     General: Bowel sounds are normal. There is no distension or abdominal bruit.     Palpations: Abdomen is soft. There is no hepatomegaly, splenomegaly, mass or pulsatile mass.     Tenderness: There is no abdominal tenderness.  Musculoskeletal:        General: Normal range of motion.     Cervical back: Normal range of motion and neck supple.     Right lower leg: Edema (1+) present.     Left lower leg: No edema.  Lymphadenopathy:     Cervical: No cervical adenopathy.  Skin:    General: Skin is warm and dry.  Neurological:     Mental Status: She is alert and oriented to person, place, and time.     Deep Tendon Reflexes: Reflexes are normal and symmetric.  Psychiatric:        Behavior: Behavior normal.        Thought Content: Thought content normal.        Judgment: Judgment normal.     BP (!) 146/71   Pulse 65   Temp (!) 96.9 F (36.1 C) (Temporal)   Resp 20   Ht 5\' 3"  (1.6 m)   Wt 155 lb (70.3 kg)   SpO2 98%   BMI 27.46 kg/m        Assessment & Plan:  Audrey Manning comes in today with chief complaint of Medical Management of Chronic Issues   Diagnosis and orders addressed:  1. Primary hypertension Low sodium diet - amLODipine (NORVASC) 10 MG tablet; Take 1 tablet (10 mg total) by mouth daily.  Dispense: 90 tablet; Refill: 1 - metoprolol tartrate (LOPRESSOR) 50 MG tablet; Take 1 tablet (50 mg total) by mouth 2 (two) times daily.  Dispense: 180 tablet; Refill: 1 - CBC with Differential/Platelet - CMP14+EGFR  2. Mixed hyperlipidemia Low fat diet - levothyroxine (SYNTHROID) 50 MCG tablet; Take 1 tablet (50 mcg total) by mouth daily.  Dispense: 90 tablet; Refill: 1 - simvastatin (ZOCOR) 20 MG tablet; Take 1 tablet (20 mg total) by mouth at bedtime.  Dispense: 90 tablet; Refill: 1 - Lipid panel  3. Stage 3a chronic kidney disease (HCC) Labs pending  4. Acquired hypothyroidism Labs pending - Thyroid Panel With TSH  5.  Age-related osteoporosis without current pathological fracture Weight bearing exercises  6. BMI 28.0-28.9,adult Discussed diet and exercise for person with BMI >25 Will recheck weight in 3-6 months   7. Edema of right lower extremity due to peripheral venous insufficiency Elevate legs when sitting - furosemide (LASIX) 40 MG tablet; Take 1 tablet (40 mg total) by mouth daily.  Dispense: 90 tablet; Refill: 1   Labs pending  Health Maintenance reviewed Diet and exercise encouraged  Follow up plan: 6 months   Mary-Margaret Daphine Deutscher, FNP

## 2022-12-01 LAB — CMP14+EGFR
ALT: 14 IU/L (ref 0–32)
AST: 29 IU/L (ref 0–40)
Albumin: 4.6 g/dL (ref 3.7–4.7)
Alkaline Phosphatase: 85 IU/L (ref 44–121)
BUN/Creatinine Ratio: 26 (ref 12–28)
BUN: 43 mg/dL — ABNORMAL HIGH (ref 8–27)
Bilirubin Total: 0.7 mg/dL (ref 0.0–1.2)
CO2: 21 mmol/L (ref 20–29)
Calcium: 9.1 mg/dL (ref 8.7–10.3)
Chloride: 100 mmol/L (ref 96–106)
Creatinine, Ser: 1.64 mg/dL — ABNORMAL HIGH (ref 0.57–1.00)
Globulin, Total: 2.9 g/dL (ref 1.5–4.5)
Glucose: 103 mg/dL — ABNORMAL HIGH (ref 70–99)
Potassium: 4.3 mmol/L (ref 3.5–5.2)
Sodium: 137 mmol/L (ref 134–144)
Total Protein: 7.5 g/dL (ref 6.0–8.5)
eGFR: 30 mL/min/{1.73_m2} — ABNORMAL LOW (ref >59)

## 2022-12-01 LAB — LIPID PANEL
Cholesterol, Total: 133 mg/dL (ref 100–199)
HDL: 67 mg/dL (ref >39)
LDL CALC COMMENT:: 2 ratio (ref 0.0–4.4)
LDL Chol Calc (NIH): 48 mg/dL (ref 0–99)
Triglycerides: 96 mg/dL (ref 0–149)
VLDL Cholesterol Cal: 18 mg/dL (ref 5–40)

## 2022-12-01 LAB — CBC WITH DIFFERENTIAL/PLATELET
Basophils Absolute: 0 10*3/uL (ref 0.0–0.2)
Basos: 1 %
EOS (ABSOLUTE): 0.1 10*3/uL (ref 0.0–0.4)
Eos: 2 %
Hematocrit: 36.6 % (ref 34.0–46.6)
Hemoglobin: 12.1 g/dL (ref 11.1–15.9)
Immature Grans (Abs): 0 10*3/uL (ref 0.0–0.1)
Immature Granulocytes: 0 %
Lymphocytes Absolute: 1.6 10*3/uL (ref 0.7–3.1)
Lymphs: 39 %
MCH: 32.9 pg (ref 26.6–33.0)
MCHC: 33.1 g/dL (ref 31.5–35.7)
MCV: 100 fL — ABNORMAL HIGH (ref 79–97)
Monocytes Absolute: 0.3 10*3/uL (ref 0.1–0.9)
Monocytes: 8 %
Neutrophils Absolute: 2 10*3/uL (ref 1.4–7.0)
Neutrophils: 50 %
Platelets: 170 10*3/uL (ref 150–450)
RBC: 3.68 x10E6/uL — ABNORMAL LOW (ref 3.77–5.28)
RDW: 12.5 % (ref 11.7–15.4)
WBC: 4.1 10*3/uL (ref 3.4–10.8)

## 2022-12-01 LAB — THYROID PANEL WITH TSH
Free Thyroxine Index: 2.5 (ref 1.2–4.9)
T3 Uptake Ratio: 29 % (ref 24–39)
T4, Total: 8.6 ug/dL (ref 4.5–12.0)
TSH: 1.92 u[IU]/mL (ref 0.450–4.500)

## 2023-03-30 ENCOUNTER — Other Ambulatory Visit: Payer: Self-pay | Admitting: Nurse Practitioner

## 2023-05-28 ENCOUNTER — Ambulatory Visit: Payer: Medicare HMO | Admitting: Nurse Practitioner

## 2023-05-28 ENCOUNTER — Encounter: Payer: Self-pay | Admitting: Nurse Practitioner

## 2023-05-28 VITALS — BP 139/64 | HR 62 | Temp 97.0°F | Ht 63.0 in | Wt 151.0 lb

## 2023-05-28 DIAGNOSIS — I872 Venous insufficiency (chronic) (peripheral): Secondary | ICD-10-CM | POA: Diagnosis not present

## 2023-05-28 DIAGNOSIS — Z6828 Body mass index (BMI) 28.0-28.9, adult: Secondary | ICD-10-CM

## 2023-05-28 DIAGNOSIS — I10 Essential (primary) hypertension: Secondary | ICD-10-CM | POA: Diagnosis not present

## 2023-05-28 DIAGNOSIS — E782 Mixed hyperlipidemia: Secondary | ICD-10-CM

## 2023-05-28 DIAGNOSIS — M810 Age-related osteoporosis without current pathological fracture: Secondary | ICD-10-CM

## 2023-05-28 DIAGNOSIS — Z0001 Encounter for general adult medical examination with abnormal findings: Secondary | ICD-10-CM

## 2023-05-28 DIAGNOSIS — N1831 Chronic kidney disease, stage 3a: Secondary | ICD-10-CM

## 2023-05-28 DIAGNOSIS — E039 Hypothyroidism, unspecified: Secondary | ICD-10-CM | POA: Diagnosis not present

## 2023-05-28 DIAGNOSIS — Z0000 Encounter for general adult medical examination without abnormal findings: Secondary | ICD-10-CM

## 2023-05-28 MED ORDER — SIMVASTATIN 20 MG PO TABS: 20.0000 mg | ORAL_TABLET | Freq: Every day | ORAL | 1 refills | Status: DC

## 2023-05-28 MED ORDER — AMLODIPINE BESYLATE 10 MG PO TABS: 10.0000 mg | ORAL_TABLET | Freq: Every day | ORAL | 1 refills | Status: DC

## 2023-05-28 MED ORDER — FUROSEMIDE 40 MG PO TABS: 40.0000 mg | ORAL_TABLET | Freq: Every day | ORAL | 1 refills | Status: DC

## 2023-05-28 MED ORDER — LEVOTHYROXINE SODIUM 50 MCG PO TABS: 50.0000 ug | ORAL_TABLET | Freq: Every day | ORAL | 1 refills | Status: DC

## 2023-05-28 MED ORDER — METOPROLOL TARTRATE 50 MG PO TABS: 50.0000 mg | ORAL_TABLET | Freq: Two times a day (BID) | ORAL | 1 refills | Status: DC

## 2023-05-28 NOTE — Progress Notes (Addendum)
 Subjective:    Patient ID: Audrey Manning, female    DOB: 1932/11/10, 89 y.o.   MRN: [id number]   Chief Complaint: annual physical   HPI:  Audrey Manning is a 89 y.o. who identifies as a female who was assigned female at birth.   Social history: Lives with: her daughter lives with her Work history: retired   Water engineer in today for follow up of the following chronic medical issues:  1. Primary hypertension No c/o chest pain, sob or headache. Doe nsot check blood pressure at home. BP Readings from Last 3 Encounters:  11/30/22 (!) 146/71  05/26/22 134/70  11/24/21 132/62     2. Mixed hyperlipidemia Does not really watch diet and does no dedicated exercise.' Lab Results  Component Value Date   CHOL 133 11/30/2022   HDL 67 11/30/2022   LDLCALC 48 11/30/2022   TRIG 96 11/30/2022   CHOLHDL 2.0 11/30/2022   The ASCVD Risk score (Arnett DK, et al., 2019) failed to calculate for the following reasons:   The 2019 ASCVD risk score is only valid for ages 58 to 66   3. Edema of right lower extremity due to peripheral venous insufficiency Has daily lower ext edema.  4. Stage 3a chronic kidney disease No voiding issues Lab Results  Component Value Date   CREATININE 1.64 (H) 11/30/2022     5. Acquired hypothyroidism No issues that she is aware of Lab Results  Component Value Date   TSH 1.920 11/30/2022     6. Age-related osteoporosis without current pathological fracture Last dexascan was done 2018. She no longer wants to repeat these.  7. BMI 28.0-28.9,adult Weight is down 4 lbs  Wt Readings from Last 3 Encounters:  05/28/23 151 lb (68.5 kg)  11/30/22 155 lb (70.3 kg)  06/15/22 154 lb (69.9 kg)   BMI Readings from Last 3 Encounters:  05/28/23 26.75 kg/m  11/30/22 27.46 kg/m  06/15/22 27.28 kg/m       New complaints: None today  Allergies  Allergen Reactions   Ace Inhibitors Cough   Outpatient Encounter Medications as of 05/28/2023  Medication Sig    amLODipine (NORVASC) 10 MG tablet Take 1 tablet (10 mg total) by mouth daily.   Cholecalciferol (VITAMIN D3) 25 MCG (1000 UT) CAPS Take 1 capsule (1,000 Units total) by mouth daily.   furosemide (LASIX) 40 MG tablet Take 1 tablet (40 mg total) by mouth daily.   levothyroxine (SYNTHROID) 50 MCG tablet Take 1 tablet (50 mcg total) by mouth daily.   metoprolol tartrate (LOPRESSOR) 50 MG tablet Take 1 tablet (50 mg total) by mouth 2 (two) times daily.   simvastatin (ZOCOR) 20 MG tablet Take 1 tablet (20 mg total) by mouth at bedtime.   triamcinolone cream (KENALOG) 0.1 % APPLY  CREAM EXTERNALLY TO AFFECTED AREA TWICE DAILY   No facility-administered encounter medications on file as of 05/28/2023.    Past Surgical History:  Procedure Laterality Date   APPENDECTOMY     TONSILLECTOMY      Family History  Problem Relation Age of Onset   Heart disease Father       Controlled substance contract: n/a     Review of Systems  Constitutional:  Negative for diaphoresis.  Eyes:  Negative for pain.  Respiratory:  Negative for shortness of breath.   Cardiovascular:  Negative for chest pain, palpitations and leg swelling.  Gastrointestinal:  Negative for abdominal pain.  Endocrine: Negative for polydipsia.  Skin:  Negative for rash.  Neurological:  Negative for dizziness, weakness and headaches.  Hematological:  Does not bruise/bleed easily.  All other systems reviewed and are negative.      Objective:   Physical Exam Vitals and nursing note reviewed.  Constitutional:      General: She is not in acute distress.    Appearance: Normal appearance. She is well-developed.  HENT:     Head: Normocephalic.     Right Ear: Tympanic membrane normal.     Left Ear: Tympanic membrane normal.     Nose: Nose normal.     Mouth/Throat:     Mouth: Mucous membranes are moist.  Eyes:     Pupils: Pupils are equal, round, and reactive to light.  Neck:     Vascular: No carotid bruit or JVD.   Cardiovascular:     Rate and Rhythm: Normal rate and regular rhythm.     Heart sounds: Normal heart sounds.  Pulmonary:     Effort: Pulmonary effort is normal. No respiratory distress.     Breath sounds: Normal breath sounds. No wheezing or rales.  Chest:     Chest wall: No tenderness.  Abdominal:     General: Bowel sounds are normal. There is no distension or abdominal bruit.     Palpations: Abdomen is soft. There is no hepatomegaly, splenomegaly, mass or pulsatile mass.     Tenderness: There is no abdominal tenderness.  Musculoskeletal:        General: Normal range of motion.     Cervical back: Normal range of motion and neck supple.     Right lower leg: Edema (1+) present.     Left lower leg: Edema (1+) present.  Lymphadenopathy:     Cervical: Cervical adenopathy present.  Skin:    General: Skin is warm and dry.     Coloration: Skin is pale.  Neurological:     Mental Status: She is alert and oriented to person, place, and time.     Deep Tendon Reflexes: Reflexes are normal and symmetric.  Psychiatric:        Behavior: Behavior normal.        Thought Content: Thought content normal.        Judgment: Judgment normal.    BP 139/64   Pulse 62   Temp (!) 97 F (36.1 C) (Temporal)   Ht 5\' 3"  (1.6 m)   Wt 151 lb (68.5 kg)   SpO2 96%   BMI 26.75 kg/m   EKG NSR-Mary-Margaret Daphine Deutscher, FNP        Assessment & Plan:  Audrey Manning comes in today with chief complaint of medical management of chronic issues    Diagnosis and orders addressed:  1. Annual physical exam   2. Primary hypertension Low sodium diet - amLODipine (NORVASC) 10 MG tablet; Take 1 tablet (10 mg total) by mouth daily.  Dispense: 90 tablet; Refill: 1 - metoprolol tartrate (LOPRESSOR) 50 MG tablet; Take 1 tablet (50 mg total) by mouth 2 (two) times daily.  Dispense: 180 tablet; Refill: 1 - CBC with Differential/Platelet - CMP14+EGFR  3. Mixed hyperlipidemia Low fat diet - levothyroxine  (SYNTHROID) 50 MCG tablet; Take 1 tablet (50 mcg total) by mouth daily.  Dispense: 90 tablet; Refill: 1 - simvastatin (ZOCOR) 20 MG tablet; Take 1 tablet (20 mg total) by mouth at bedtime.  Dispense: 90 tablet; Refill: 1 - Lipid panel  4. Edema of right lower extremity due to peripheral venous insufficiency Elevate legs when sitting - furosemide (LASIX) 40 MG  tablet; Take 1 tablet (40 mg total) by mouth daily.  Dispense: 90 tablet; Refill: 1  5. Stage 3a chronic kidney disease Labs pending  6. Acquired hypothyroidism Labs pending  7. Age-related osteoporosis without current pathological fracture Eight bearing exercises  8. BMI 28.0-28.9,adult Discussed diet and exercise for person with BMI >25 Will recheck weight in 3-6 months    Labs pending Health Maintenance reviewed Diet and exercise encouraged  Follow up plan: 6 months   Mary-Margaret Daphine Deutscher, FNP

## 2023-05-28 NOTE — Patient Instructions (Signed)
 Fall Prevention in the Home, Adult Falls can cause injuries and can happen to people of all ages. There are many things you can do to make your home safer and to help prevent falls. What actions can I take to prevent falls? General information Use good lighting in all rooms. Make sure to: Replace any light bulbs that burn out. Turn on the lights in dark areas and use night-lights. Keep items that you use often in easy-to-reach places. Lower the shelves around your home if needed. Move furniture so that there are clear paths around it. Do not use throw rugs or other things on the floor that can make you trip. If any of your floors are uneven, fix them. Add color or contrast paint or tape to clearly mark and help you see: Grab bars or handrails. First and last steps of staircases. Where the edge of each step is. If you use a ladder or stepladder: Make sure that it is fully opened. Do not climb a closed ladder. Make sure the sides of the ladder are locked in place. Have someone hold the ladder while you use it. Know where your pets are as you move through your home. What can I do in the bathroom?     Keep the floor dry. Clean up any water on the floor right away. Remove soap buildup in the bathtub or shower. Buildup makes bathtubs and showers slippery. Use non-skid mats or decals on the floor of the bathtub or shower. Attach bath mats securely with double-sided, non-slip rug tape. If you need to sit down in the shower, use a non-slip stool. Install grab bars by the toilet and in the bathtub and shower. Do not use towel bars as grab bars. What can I do in the bedroom? Make sure that you have a light by your bed that is easy to reach. Do not use any sheets or blankets on your bed that hang to the floor. Have a firm chair or bench with side arms that you can use for support when you get dressed. What can I do in the kitchen? Clean up any spills right away. If you need to reach something  above you, use a step stool with a grab bar. Keep electrical cords out of the way. Do not use floor polish or wax that makes floors slippery. What can I do with my stairs? Do not leave anything on the stairs. Make sure that you have a light switch at the top and the bottom of the stairs. Make sure that there are handrails on both sides of the stairs. Fix handrails that are broken or loose. Install non-slip stair treads on all your stairs if they do not have carpet. Avoid having throw rugs at the top or bottom of the stairs. Choose a carpet that does not hide the edge of the steps on the stairs. Make sure that the carpet is firmly attached to the stairs. Fix carpet that is loose or worn. What can I do on the outside of my home? Use bright outdoor lighting. Fix the edges of walkways and driveways and fix any cracks. Clear paths of anything that can make you trip, such as tools or rocks. Add color or contrast paint or tape to clearly mark and help you see anything that might make you trip as you walk through a door, such as a raised step or threshold. Trim any bushes or trees on paths to your home. Check to see if handrails are loose  or broken and that both sides of all steps have handrails. Install guardrails along the edges of any raised decks and porches. Have leaves, snow, or ice cleared regularly. Use sand, salt, or ice melter on paths if you live where there is ice and snow during the winter. Clean up any spills in your garage right away. This includes grease or oil spills. What other actions can I take? Review your medicines with your doctor. Some medicines can cause dizziness or changes in blood pressure, which increase your risk of falling. Wear shoes that: Have a low heel. Do not wear high heels. Have rubber bottoms and are closed at the toe. Feel good on your feet and fit well. Use tools that help you move around if needed. These include: Canes. Walkers. Scooters. Crutches. Ask  your doctor what else you can do to help prevent falls. This may include seeing a physical therapist to learn to do exercises to move better and get stronger. Where to find more information Centers for Disease Control and Prevention, STEADI: TonerPromos.no [employer] on Aging: BaseRingTones.pl National Institute on Aging: BaseRingTones.pl Contact a doctor if: You are afraid of falling at home. You feel weak, drowsy, or dizzy at home. You fall at home. Get help right away if you: Lose consciousness or have trouble moving after a fall. Have a fall that causes a head injury. These symptoms may be an emergency. Get help right away. Call 911. Do not wait to see if the symptoms will go away. Do not drive yourself to the hospital. This information is not intended to replace advice given to you by your health care provider. Make sure you discuss any questions you have with your health care provider. Document Revised: 10/13/2021 Document Reviewed: 10/13/2021 Elsevier Patient Education  2024 [employer].

## 2023-05-29 LAB — CBC WITH DIFFERENTIAL/PLATELET
Basophils Absolute: 0.1 10*3/uL (ref 0.0–0.2)
Basos: 1 %
EOS (ABSOLUTE): 0.1 10*3/uL (ref 0.0–0.4)
Eos: 1 %
Hematocrit: 36.1 % (ref 34.0–46.6)
Hemoglobin: 12 g/dL (ref 11.1–15.9)
Immature Grans (Abs): 0 10*3/uL (ref 0.0–0.1)
Immature Granulocytes: 0 %
Lymphocytes Absolute: 1.6 10*3/uL (ref 0.7–3.1)
Lymphs: 35 %
MCH: 32.9 pg (ref 26.6–33.0)
MCHC: 33.2 g/dL (ref 31.5–35.7)
MCV: 99 fL — ABNORMAL HIGH (ref 79–97)
Monocytes Absolute: 0.4 10*3/uL (ref 0.1–0.9)
Monocytes: 10 %
Neutrophils Absolute: 2.4 10*3/uL (ref 1.4–7.0)
Neutrophils: 53 %
Platelets: 157 10*3/uL (ref 150–450)
RBC: 3.65 x10E6/uL — ABNORMAL LOW (ref 3.77–5.28)
RDW: 12.5 % (ref 11.7–15.4)
WBC: 4.6 10*3/uL (ref 3.4–10.8)

## 2023-05-29 LAB — CMP14+EGFR
ALT: 14 IU/L (ref 0–32)
AST: 29 IU/L (ref 0–40)
Albumin: 4.5 g/dL (ref 3.7–4.7)
Alkaline Phosphatase: 86 IU/L (ref 44–121)
BUN/Creatinine Ratio: 20 (ref 12–28)
BUN: 32 mg/dL — ABNORMAL HIGH (ref 8–27)
Bilirubin Total: 0.6 mg/dL (ref 0.0–1.2)
CO2: 25 mmol/L (ref 20–29)
Calcium: 9.3 mg/dL (ref 8.7–10.3)
Chloride: 98 mmol/L (ref 96–106)
Creatinine, Ser: 1.58 mg/dL — ABNORMAL HIGH (ref 0.57–1.00)
Globulin, Total: 3 g/dL (ref 1.5–4.5)
Glucose: 97 mg/dL (ref 70–99)
Potassium: 4.3 mmol/L (ref 3.5–5.2)
Sodium: 137 mmol/L (ref 134–144)
Total Protein: 7.5 g/dL (ref 6.0–8.5)
eGFR: 31 mL/min/{1.73_m2} — ABNORMAL LOW (ref >59)

## 2023-05-29 LAB — THYROID PANEL WITH TSH
Free Thyroxine Index: 3 (ref 1.2–4.9)
T3 Uptake Ratio: 32 % (ref 24–39)
T4, Total: 9.5 ug/dL (ref 4.5–12.0)
TSH: 2.69 u[IU]/mL (ref 0.450–4.500)

## 2023-05-29 LAB — LIPID PANEL
Chol/HDL Ratio: 2 ratio (ref 0.0–4.4)
Cholesterol, Total: 131 mg/dL (ref 100–199)
HDL: 64 mg/dL (ref >39)
LDL Chol Calc (NIH): 47 mg/dL (ref 0–99)
Triglycerides: 115 mg/dL (ref 0–149)
VLDL Cholesterol Cal: 20 mg/dL (ref 5–40)

## 2023-05-29 LAB — VITAMIN D 25 HYDROXY (VIT D DEFICIENCY, FRACTURES): Vit D, 25-Hydroxy: 45.2 ng/mL (ref 30.0–100.0)

## 2023-06-18 ENCOUNTER — Ambulatory Visit: Payer: Medicare HMO

## 2023-06-18 VITALS — BP 139/64 | HR 62 | Ht 63.0 in | Wt 151.0 lb

## 2023-06-18 DIAGNOSIS — Z0000 Encounter for general adult medical examination without abnormal findings: Secondary | ICD-10-CM
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
## 2023-06-18 NOTE — Patient Instructions (Signed)
 Audrey Manning , Thank you for taking time to come for your Medicare Wellness Visit. I appreciate your ongoing commitment to your health goals. Please review the following plan we discussed and let me know if I can assist you in the future.   Referrals/Orders/Follow-Ups/Clinician Recommendations: Please remember to get tetanus vaccine update at your next visit with your provider.   This is a list of the screening recommended for you and due dates:  Health Maintenance  Topic Date Due   Zoster (Shingles) Vaccine (1 of 2) 08/27/2023*   DTaP/Tdap/Td vaccine (3 - Td or Tdap) 05/27/2024*   COVID-19 Vaccine (1 - 2024-25 season) 07/03/2024*   Flu Shot  09/24/2023   Medicare Annual Wellness Visit  06/17/2024   Pneumonia Vaccine  Completed   DEXA scan (bone density measurement)  Completed   HPV Vaccine  Aged Out   Meningitis B Vaccine  Aged Out   Mammogram  Discontinued  *Topic was postponed. The date shown is not the original due date.    Advanced directives: (Copy Requested) Please bring a copy of your health care power of attorney and living will to the office to be added to your chart at your convenience. You can mail to [facility] [address] [place], [place] [address] or email to [email or web address]  Next Medicare Annual Wellness Visit scheduled for next year: Yes

## 2023-08-12 ENCOUNTER — Encounter: Payer: Self-pay | Admitting: *Deleted

## 2023-09-23 ENCOUNTER — Ambulatory Visit: Payer: Self-pay

## 2023-09-23 NOTE — Telephone Encounter (Signed)
 Apt scheduled.

## 2023-09-23 NOTE — Telephone Encounter (Signed)
 FYI Only or Action Required?: Action required by provider: request for appointment.  Patient was last seen in primary care on 05/28/2023 by Gladis Mustard, FNP.  Called Nurse Triage reporting Insect Bite.  Symptoms began yesterday.  Interventions attempted: OTC medications: tea tree oil.  Symptoms are: gradually worsening. Possible bug bite left ring finger. Pus-filled blister, red, swollen.  Triage Disposition: See Physician Within 24 Hours  Patient/caregiver understands and will follow disposition?: Yes    Copied from CRM #[id number]. Topic: Clinical - Red Word Triage >> Sep 23, 2023 10:13 AM Wess RAMAN wrote: Red Word that prompted transfer to Nurse Triage: Insect bite on left ring finger. It it red and has been that way for 2 days and swollen with pus coming out Reason for Disposition  [1] Red or very tender (to touch) area AND [2] started over 24 hours after the bite  Answer Assessment - Initial Assessment Questions 1. TYPE of INSECT: What type of insect was it?      Unsure 2. ONSET: When did you get bitten?      Yesterday 3. LOCATION: Where is the insect bite located?      Left hand, ring finger 4. REDNESS: Is the area red or pink? If Yes, ask: What size is the area of redness? (inches or cm). When did the redness start?     yes 5. PAIN: Is there any pain? If Yes, ask: How bad is the pain? (Scale 0-10; or none, mild, moderate, severe)     no 6. ITCHING: Does it itch? If Yes, ask: How bad is the itch?      no 7. SWELLING: How big is the swelling? (e.g., inches, cm, or compare to coins)     mild 8. OTHER SYMPTOMS: Do you have any other symptoms?  (e.g., difficulty breathing, fever, hives)     Blister 9. PREGNANCY: Is there any chance you are pregnant? When was your last menstrual period?     no  Protocols used: Insect Bite-A-AH

## 2023-09-24 ENCOUNTER — Encounter: Payer: Self-pay | Admitting: Family Medicine

## 2023-09-24 ENCOUNTER — Ambulatory Visit

## 2023-09-24 VITALS — BP 130/60 | HR 66 | Temp 97.5°F | Ht 63.0 in | Wt 153.8 lb

## 2023-09-24 DIAGNOSIS — L02512 Cutaneous abscess of left hand: Secondary | ICD-10-CM | POA: Diagnosis not present

## 2023-09-24 MED ORDER — DOXYCYCLINE HYCLATE 100 MG PO TABS: 100.0000 mg | ORAL_TABLET | Freq: Two times a day (BID) | ORAL | 0 refills | Status: AC

## 2023-09-24 NOTE — Patient Instructions (Signed)
 Skin Abscess  A skin abscess is an infected area on or under your skin. It contains pus and other material. An abscess may also be called a furuncle, carbuncle, or boil. It is often the result of an infection caused by bacteria. An abscess can occur in or on almost any part of your body. Sometimes, an abscess may break open (rupture) on its own. In most cases, it will keep getting worse unless it is treated. An abscess can cause pain and make you feel ill. An untreated abscess can cause infection to spread to other parts of your body or your bloodstream. The abscess may need to be drained. You may also need to take antibiotics. What are the causes? An abscess occurs when germs, like bacteria, pass through your skin and cause an infection. This may be caused by: A scrape or cut on your skin. A puncture wound through your skin, such as a needle injection or insect bite. Blocked oil or sweat glands. Blocked and infected hair follicles. A fluid-filled sac that forms beneath your skin (sebaceous cyst) and becomes infected. What increases the risk? You may be more likely to develop an abscess if: You have problems with blood circulation, or you have a weak body defense system (immune system). You have diabetes. You have dry and irritated skin. You get injections often or use IV drugs. You have a foreign body in a wound, such as a splinter. You smoke or use tobacco products. What are the signs or symptoms? Symptoms of this condition include: A painful, firm bump under the skin. A bump with pus at the top. This may break through the skin and drain. Other symptoms include: Redness and swelling around the abscess. Warmth or tenderness. Swelling of the lymph nodes (glands) near the abscess. A sore on the skin. How is this diagnosed? This condition may be diagnosed based on a physical exam and your medical history. You may also have tests done, such as: A test of a sample of pus. This may be done  to find what is causing the infection. Blood tests. Imaging tests, such as an ultrasound, CT scan, or MRI. How is this treated? A small abscess that drains on its own may not need to be treated. Treatment for larger abscesses may include: Moist heat or a heat pack applied to the area a few times a day. Incision and drainage. This is a procedure to drain the abscess. Antibiotics. For a severe abscess, you may first get antibiotics through an IV and then change to antibiotics by mouth. Follow these instructions at home: Medicines Take over-the-counter and prescription medicines only as told by your provider. If you were prescribed antibiotics, take them as told by your provider. Do not stop using the antibiotic even if you start to feel better. Abscess care  If you have an abscess that has not drained, apply heat to the affected area. Use the heat source that your provider recommends, such as a moist heat pack or a heating pad. Place a towel between your skin and the heat source. Leave the heat on for 20-30 minutes at a time. If your skin turns bright red, remove the heat right away to prevent burns. The risk of burns is higher if you cannot feel pain, heat, or cold. Follow instructions from your provider about how to take care of your abscess. Make sure you: Cover the abscess with a bandage (dressing). Wash your hands with soap and water for at least 20 seconds before  and after you change the dressing or gauze. If soap and water are not available, use hand sanitizer. Change your dressing or gauze as told by your provider. Check your abscess every day for signs of an infection that is getting worse. Check for: More redness, swelling, pain, or tenderness. More fluid or blood. Warmth. More pus or a worse smell. General instructions To avoid spreading the infection: Do not share personal care items, towels, or hot tubs with others. Avoid making skin contact with other people. Be careful  when getting rid of used dressings, wound packing, or any drainage from the abscess. Do not use any products that contain nicotine or tobacco. These products include cigarettes, chewing tobacco, and vaping devices, such as e-cigarettes. If you need help quitting, ask your provider. Do not use any creams, ointments, or liquids unless you have been told to by your provider. Contact a health care provider if: You see redness that spreads quickly or red streaks on your skin spreading away from the abscess. You have any signs of worse infection at the abscess. You vomit every time you eat or drink. You have a fever, chills, or muscle aches. The cyst or abscess returns. Get help right away if: You have severe pain. You make less pee (urine) than normal. This information is not intended to replace advice given to you by your health care provider. Make sure you discuss any questions you have with your health care provider. Document Revised: 09/24/2021 Document Reviewed: 09/24/2021 Elsevier Patient Education  2024 [employer].

## 2023-09-24 NOTE — Progress Notes (Signed)
   Acute Office Visit  Subjective:     Patient ID: Audrey Manning, female    DOB: 11/05/1932, 89 y.o.   MRN: [id number]  Chief Complaint  Patient presents with   Hand Pain         HPI Here with daughter today. Patient is in today for left ring finger pain x 2 days. Also with mild swelling. Erythema and tenderness present. Denies numbness, tingling. Denies injury.   ROS As per HPI.      Objective:    BP 130/60   Pulse 66   Temp (!) 97.5 F (36.4 C) (Temporal)   Ht 5' 3 (1.6 m)   Wt 153 lb 12.8 oz (69.8 kg)   SpO2 98%   BMI 27.24 kg/m    Physical Exam Vitals and nursing note reviewed.  Constitutional:      General: She is not in acute distress.    Appearance: She is not ill-appearing, toxic-appearing or diaphoretic.  Pulmonary:     Effort: Pulmonary effort is normal. No respiratory distress.  Skin:    General: Skin is warm and dry.     Findings: Abscess (left ring finger. See picture below. ROM and sensation intace. Brisk cap refill.) present.  Neurological:     General: No focal deficit present.     Mental Status: She is alert and oriented to person, place, and time.  Psychiatric:        Mood and Affect: Mood normal.        Behavior: Behavior normal.       No results found for any visits on 09/24/23.  I & D  Date/Time: 09/24/2023 10:56 AM  Performed by: Joesph Annabella HERO, FNP Authorized by: Joesph Annabella HERO, FNP   Consent:    Consent obtained:  Written   Consent given by:  Patient   Risks, benefits, and alternatives were discussed: yes     Risks discussed:  Bleeding, incomplete drainage, pain and infection   Alternatives discussed:  Alternative treatment, observation and delayed treatment Location:    Type:  Abscess   Size:  1 cm   Location:  Upper extremity   Upper extremity location:  Finger   Finger location:  L ring finger Pre-procedure details:    Skin preparation:  Povidone-iodine Sedation:    Sedation type:  None Anesthesia:     Anesthesia method:  Topical application   Topical anesthesia: ethylchloride spray. Procedure type:    Complexity:  Simple Procedure details:    Needle aspiration: no     Incision types:  Stab incision (18 gauge needle)   Incision depth:  Dermal   Scalpel blade:  11   Drainage:  Purulent and bloody   Drainage amount:  Moderate   Wound treatment:  Wound left open   Packing materials:  None Post-procedure details:    Procedure completion:  Tolerated well, no immediate complications      Assessment & Plan:   [name] was seen today for hand pain.  Diagnoses and all orders for this visit:  Abscess of left ring finger Simple I&D today. Dressing applied. Doxycycline  BID as below. Will recheck in 1 weeks. Discussed return precautions and home wound care.  -     doxycycline  (VIBRA -TABS) 100 MG tablet; Take 1 tablet (100 mg total) by mouth 2 (two) times daily for 7 days. -     I & D  Return in about 1 week (around 10/01/2023) for finger recheck.  Annabella HERO Joesph, FNP

## 2023-10-01 ENCOUNTER — Ambulatory Visit: Admitting: Nurse Practitioner

## 2023-10-01 ENCOUNTER — Ambulatory Visit: Admitting: Family Medicine

## 2023-10-01 ENCOUNTER — Encounter: Payer: Self-pay | Admitting: Nurse Practitioner

## 2023-10-01 VITALS — BP 139/68 | HR 69 | Temp 97.3°F | Ht 63.0 in | Wt 155.0 lb

## 2023-10-01 DIAGNOSIS — L03012 Cellulitis of left finger: Secondary | ICD-10-CM

## 2023-10-01 NOTE — Progress Notes (Signed)
   Subjective:    Patient ID: Audrey Manning, female    DOB: [birth year], 89 y.o.   MRN: [id number]   Chief Complaint: Wound Check   Wound Check    Patient was seen on 09/24/23 with a wound on her left ring finger. Not sure what caused issue. She was started on doxycycline  after drain area. Patient here today for recheck of wound.  Patient Active Problem List   Diagnosis Date Noted   Edema of right lower extremity due to peripheral venous insufficiency 08/27/2020   CKD (chronic kidney disease) stage 3, GFR 30-59 ml/min (HCC) 12/24/2014   Acquired hypothyroidism 08/14/2013   BMI 28.0-28.9,adult 08/14/2013   Hypertension 05/20/2010   Hyperlipidemia 05/20/2010   Allergic rhinitis 05/20/2010   Osteoporosis 05/20/2010       Review of Systems  Constitutional:  Negative for diaphoresis.  Eyes:  Negative for pain.  Respiratory:  Negative for shortness of breath.   Cardiovascular:  Negative for chest pain, palpitations and leg swelling.  Gastrointestinal:  Negative for abdominal pain.  Endocrine: Negative for polydipsia.  Skin:  Negative for rash.  Neurological:  Negative for dizziness, weakness and headaches.  Hematological:  Does not bruise/bleed easily.  All other systems reviewed and are negative.      Objective:   Physical Exam Constitutional:      Appearance: Normal appearance. She is obese.  Cardiovascular:     Rate and Rhythm: Normal rate and regular rhythm.  Pulmonary:     Breath sounds: Normal breath sounds.  Skin:    General: Skin is warm.     Comments: See attached picture  Neurological:     General: No focal deficit present.     Mental Status: She is alert and oriented to person, place, and time.  Psychiatric:        Mood and Affect: Mood normal.        Behavior: Behavior normal.    BP 139/68   Pulse 69   Temp (!) 97.3 F (36.3 C) (Temporal)   Ht 5' 3 (1.6 m)   Wt 155 lb (70.3 kg)   SpO2 98%   BMI 27.46 kg/m          Assessment & Plan:   Audrey Manning in today with chief complaint of Wound Check   1. Cellulitis of left ring finger (Primary) Resolving Continue epsom salt soaks BID RTO as scheduled for chronic follow up    The above assessment and management plan was discussed with the patient. The patient verbalized understanding of and has agreed to the management plan. Patient is aware to call the clinic if symptoms persist or worsen. Patient is aware when to return to the clinic for a follow-up visit. Patient educated on when it is appropriate to go to the emergency department.   Mary-Margaret Gladis, FNP

## 2023-11-11 ENCOUNTER — Ambulatory Visit: Admitting: Nurse Practitioner

## 2023-11-12 ENCOUNTER — Ambulatory Visit: Admitting: Nurse Practitioner

## 2023-11-16 ENCOUNTER — Ambulatory Visit: Admitting: Nurse Practitioner

## 2023-11-19 ENCOUNTER — Ambulatory Visit (INDEPENDENT_AMBULATORY_CARE_PROVIDER_SITE_OTHER): Admitting: Nurse Practitioner

## 2023-11-19 ENCOUNTER — Encounter: Payer: Self-pay | Admitting: Nurse Practitioner

## 2023-11-19 VITALS — BP 136/71 | HR 68 | Temp 97.1°F | Ht 63.0 in | Wt 155.0 lb

## 2023-11-19 DIAGNOSIS — E782 Mixed hyperlipidemia: Secondary | ICD-10-CM

## 2023-11-19 DIAGNOSIS — Z6828 Body mass index (BMI) 28.0-28.9, adult: Secondary | ICD-10-CM

## 2023-11-19 DIAGNOSIS — I872 Venous insufficiency (chronic) (peripheral): Secondary | ICD-10-CM | POA: Diagnosis not present

## 2023-11-19 DIAGNOSIS — E039 Hypothyroidism, unspecified: Secondary | ICD-10-CM | POA: Diagnosis not present

## 2023-11-19 DIAGNOSIS — R600 Localized edema: Secondary | ICD-10-CM

## 2023-11-19 DIAGNOSIS — N1831 Chronic kidney disease, stage 3a: Secondary | ICD-10-CM

## 2023-11-19 DIAGNOSIS — Z23 Encounter for immunization: Secondary | ICD-10-CM

## 2023-11-19 DIAGNOSIS — I10 Essential (primary) hypertension: Secondary | ICD-10-CM

## 2023-11-19 DIAGNOSIS — M810 Age-related osteoporosis without current pathological fracture: Secondary | ICD-10-CM

## 2023-11-19 LAB — LIPID PANEL

## 2023-11-19 MED ORDER — METOPROLOL TARTRATE 50 MG PO TABS
50.0000 mg | ORAL_TABLET | Freq: Two times a day (BID) | ORAL | 1 refills | Status: AC
Start: 2023-11-19 — End: ?

## 2023-11-19 MED ORDER — SIMVASTATIN 20 MG PO TABS: 20.0000 mg | ORAL_TABLET | Freq: Every day | ORAL | 1 refills | Status: AC

## 2023-11-19 MED ORDER — LEVOTHYROXINE SODIUM 50 MCG PO TABS: 50.0000 ug | ORAL_TABLET | Freq: Every day | ORAL | 1 refills | Status: AC

## 2023-11-19 MED ORDER — FUROSEMIDE 40 MG PO TABS: 40.0000 mg | ORAL_TABLET | Freq: Every day | ORAL | 1 refills | Status: AC

## 2023-11-19 MED ORDER — AMLODIPINE BESYLATE 10 MG PO TABS: 10.0000 mg | ORAL_TABLET | Freq: Every day | ORAL | 1 refills | Status: AC

## 2023-11-19 NOTE — Progress Notes (Signed)
 Subjective:    Patient ID: Audrey Manning, female    DOB: 1932/04/08, 89 y.o.   MRN: [id number]   Chief Complaint: medical management of chronic issues     HPI:  Audrey Manning is a 89 y.o. who identifies as a female who was assigned female at birth.   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 Primary hypertension No c/o chest pain, sob or headache. Doe snot check blood pressure at home. BP Readings from Last 3 Encounters:  11/19/23 136/71  10/01/23 139/68  09/24/23 130/60     2. Mixed hyperlipidemia Does not watch diet and does no dedicated exercise. Lab Results  Component Value Date   CHOL 131 05/28/2023   HDL 64 05/28/2023   LDLCALC 47 05/28/2023   TRIG 115 05/28/2023   CHOLHDL 2.0 05/28/2023     3. Stage 3a chronic kidney disease (HCC) No voiding issues Lab Results  Component Value Date   CREATININE 1.58 (H) 05/28/2023     4. Acquired hypothyroidism No issues that she is aware of. Lab Results  Component Value Date   TSH 2.690 05/28/2023     5. Age-related osteoporosis without current pathological fracture Last dexascan was done on 11/30/16. Patient has refused repeat.  6. BMI 28.0-28.9,adult No recent weight changes Wt Readings from Last 3 Encounters:  11/19/23 155 lb (70.3 kg)  10/01/23 155 lb (70.3 kg)  09/24/23 153 lb 12.8 oz (69.8 kg)   BMI Readings from Last 3 Encounters:  11/19/23 27.46 kg/m  10/01/23 27.46 kg/m  09/24/23 27.24 kg/m      New complaints: None today  Allergies  Allergen Reactions   Ace Inhibitors Cough   Outpatient Encounter Medications as of 11/19/2023  Medication Sig   amLODipine  (NORVASC ) 10 MG tablet Take 1 tablet (10 mg total) by mouth daily.   Cholecalciferol (VITAMIN D3) 25 MCG (1000 UT) CAPS Take 1 capsule (1,000 Units total) by mouth daily.   furosemide  (LASIX ) 40 MG  tablet Take 1 tablet (40 mg total) by mouth daily.   levothyroxine  (SYNTHROID ) 50 MCG tablet Take 1 tablet (50 mcg total) by mouth daily.   metoprolol  tartrate (LOPRESSOR ) 50 MG tablet Take 1 tablet (50 mg total) by mouth 2 (two) times daily.   Omega-3 Fatty Acids (FISH OIL) 300 MG CAPS Take by mouth.   simvastatin  (ZOCOR ) 20 MG tablet Take 1 tablet (20 mg total) by mouth at bedtime.   triamcinolone  cream (KENALOG ) 0.1 % APPLY  CREAM EXTERNALLY TO AFFECTED AREA TWICE DAILY   No facility-administered encounter medications on file as of 11/19/2023.    Past Surgical History:  Procedure Laterality Date   APPENDECTOMY     TONSILLECTOMY      Family History  Problem Relation Age of Onset   Heart disease Father       Controlled substance contract: n/a     Review of Systems  Constitutional:  Negative for diaphoresis.  Eyes:  Negative for pain.  Respiratory:  Negative for shortness of breath.   Cardiovascular:  Negative for chest pain, palpitations and leg swelling.  Gastrointestinal:  Negative for abdominal pain.  Endocrine: Negative for polydipsia.  Skin:  Negative for rash.  Neurological:  Negative for dizziness, weakness and headaches.  Hematological:  Does not bruise/bleed easily.  All other systems reviewed and are negative.      Objective:  Physical Exam Vitals and nursing note reviewed.  Constitutional:      General: She is not in acute distress.    Appearance: Normal appearance. She is well-developed.  HENT:     Head: Normocephalic.     Right Ear: Tympanic membrane normal.     Left Ear: Tympanic membrane normal.     Nose: Nose normal.     Mouth/Throat:     Mouth: Mucous membranes are moist.  Eyes:     Pupils: Pupils are equal, round, and reactive to light.  Neck:     Vascular: No carotid bruit or JVD.  Cardiovascular:     Rate and Rhythm: Normal rate and regular rhythm.     Heart sounds: Normal heart sounds.  Pulmonary:     Effort: Pulmonary effort is  normal. No respiratory distress.     Breath sounds: Normal breath sounds. No wheezing or rales.  Chest:     Chest wall: No tenderness.  Abdominal:     General: Bowel sounds are normal. There is no distension or abdominal bruit.     Palpations: Abdomen is soft. There is no hepatomegaly, splenomegaly, mass or pulsatile mass.     Tenderness: There is no abdominal tenderness.  Musculoskeletal:        General: Normal range of motion.     Cervical back: Normal range of motion and neck supple.     Right lower leg: Edema (1+) present.     Left lower leg: No edema.  Lymphadenopathy:     Cervical: No cervical adenopathy.  Skin:    General: Skin is warm and dry.  Neurological:     Mental Status: She is alert and oriented to person, place, and time.     Deep Tendon Reflexes: Reflexes are normal and symmetric.  Psychiatric:        Behavior: Behavior normal.        Thought Content: Thought content normal.        Judgment: Judgment normal.     BP 136/71   Pulse 68   Temp (!) 97.1 F (36.2 C)   Ht 5' 3 (1.6 m)   Wt 155 lb (70.3 kg)   SpO2 97%   BMI 27.46 kg/m        Assessment & Plan:  Audrey Manning comes in today with chief complaint of CHRONIC FOLLOW UP   Diagnosis and orders addressed:  1. Primary hypertension Low sodium diet - amLODipine  (NORVASC ) 10 MG tablet; Take 1 tablet (10 mg total) by mouth daily.  Dispense: 90 tablet; Refill: 1 - metoprolol  tartrate (LOPRESSOR ) 50 MG tablet; Take 1 tablet (50 mg total) by mouth 2 (two) times daily.  Dispense: 180 tablet; Refill: 1 - CBC with Differential/Platelet - CMP14+EGFR  2. Mixed hyperlipidemia Low fat diet - levothyroxine  (SYNTHROID ) 50 MCG tablet; Take 1 tablet (50 mcg total) by mouth daily.  Dispense: 90 tablet; Refill: 1 - simvastatin  (ZOCOR ) 20 MG tablet; Take 1 tablet (20 mg total) by mouth at bedtime.  Dispense: 90 tablet; Refill: 1 - Lipid panel  3. Stage 3a chronic kidney disease (HCC) Labs pending  4.  Acquired hypothyroidism Labs pending - Thyroid  Panel With TSH  5. Age-related osteoporosis without current pathological fracture Weight bearing exercises  6. BMI 28.0-28.9,adult Discussed diet and exercise for person with BMI >25 Will recheck weight in 3-6 months   7. Edema of right lower extremity due to peripheral venous insufficiency Elevate legs when sitting - furosemide  (LASIX ) 40 MG tablet; Take 1  tablet (40 mg total) by mouth daily.  Dispense: 90 tablet; Refill: 1   Labs pending Health Maintenance reviewed Diet and exercise encouraged  Follow up plan: 6 months   Mary-Margaret Gladis, FNP

## 2023-11-19 NOTE — Patient Instructions (Signed)
 Peripheral Edema  Peripheral edema is swelling that is caused by a buildup of fluid. Peripheral edema most often affects the lower legs, ankles, and feet. It can also develop in the arms, hands, and face. The area of the body that has peripheral edema will look swollen. It may also feel heavy or warm. Your clothes may start to feel tight. Pressing on the area may make a temporary dent in your skin (pitting edema). You may not be able to move your swollen arm or leg as much as usual. There are many causes of peripheral edema. It can happen because of a complication of other conditions such as heart failure, kidney disease, or a problem with your circulation. It also can be a side effect of certain medicines or happen because of an infection. It often happens to women during pregnancy. Sometimes, the cause is not known. Follow these instructions at home: Managing pain, stiffness, and swelling  Raise (elevate) your legs while you are sitting or lying down. Move around often to prevent stiffness and to reduce swelling. Do not sit or stand for long periods of time. Do not wear tight clothing. Do not wear garters on your upper legs. Exercise your legs to get your circulation going. This helps to move the fluid back into your blood vessels, and it may help the swelling go down. Wear compression stockings as told by your health care provider. These stockings help to prevent blood clots and reduce swelling in your legs. It is important that these are the correct size. These stockings should be prescribed by your doctor to prevent possible injuries. If elastic bandages or wraps are recommended, use them as told by your health care provider. Medicines Take over-the-counter and prescription medicines only as told by your health care provider. Your health care provider may prescribe medicine to help your body get rid of excess water (diuretic). Take this medicine if you are told to take it. General  instructions Eat a low-salt (low-sodium) diet as told by your health care provider. Sometimes, eating less salt may reduce swelling. Pay attention to any changes in your symptoms. Moisturize your skin daily to help prevent skin from cracking and draining. Keep all follow-up visits. This is important. Contact a health care provider if: You have a fever. You have swelling in only one leg. You have increased swelling, redness, or pain in one or both of your legs. You have drainage or sores at the area where you have edema. Get help right away if: You have edema that starts suddenly or is getting worse, especially if you are pregnant or have a medical condition. You develop shortness of breath, especially when you are lying down. You have pain in your chest or abdomen. You feel weak. You feel like you will faint. These symptoms may be an emergency. Get help right away. Call 911. Do not wait to see if the symptoms will go away. Do not drive yourself to the hospital. Summary Peripheral edema is swelling that is caused by a buildup of fluid. Peripheral edema most often affects the lower legs, ankles, and feet. Move around often to prevent stiffness and to reduce swelling. Do not sit or stand for long periods of time. Pay attention to any changes in your symptoms. Contact a health care provider if you have edema that starts suddenly or is getting worse, especially if you are pregnant or have a medical condition. Get help right away if you develop shortness of breath, especially when lying down.  This information is not intended to replace advice given to you by your health care provider. Make sure you discuss any questions you have with your health care provider. Document Revised: 10/14/2020 Document Reviewed: 10/14/2020 Elsevier Patient Education  2024 [employer].

## 2023-11-20 LAB — LIPID PANEL
Chol/HDL Ratio: 2 ratio (ref 0.0–4.4)
Cholesterol, Total: 122 mg/dL (ref 100–199)
HDL: 60 mg/dL (ref >39)
LDL Chol Calc (NIH): 42 mg/dL (ref 0–99)
Triglycerides: 115 mg/dL (ref 0–149)
VLDL Cholesterol Cal: 20 mg/dL (ref 5–40)

## 2023-11-20 LAB — CBC WITH DIFFERENTIAL/PLATELET
Basophils Absolute: 0 x10E3/uL (ref 0.0–0.2)
Basos: 1 %
EOS (ABSOLUTE): 0.1 x10E3/uL (ref 0.0–0.4)
Eos: 3 %
Hematocrit: 36.2 % (ref 34.0–46.6)
Hemoglobin: 11.7 g/dL (ref 11.1–15.9)
Immature Grans (Abs): 0 x10E3/uL (ref 0.0–0.1)
Immature Granulocytes: 0 %
Lymphocytes Absolute: 1.4 x10E3/uL (ref 0.7–3.1)
Lymphs: 36 %
MCH: 32.2 pg (ref 26.6–33.0)
MCHC: 32.3 g/dL (ref 31.5–35.7)
MCV: 100 fL — ABNORMAL HIGH (ref 79–97)
Monocytes Absolute: 0.4 x10E3/uL (ref 0.1–0.9)
Monocytes: 10 %
Neutrophils Absolute: 1.9 x10E3/uL (ref 1.4–7.0)
Neutrophils: 50 %
Platelets: 176 x10E3/uL (ref 150–450)
RBC: 3.63 x10E6/uL — ABNORMAL LOW (ref 3.77–5.28)
RDW: 12.7 % (ref 11.7–15.4)
WBC: 3.8 x10E3/uL (ref 3.4–10.8)

## 2023-11-20 LAB — COMPREHENSIVE METABOLIC PANEL WITH GFR
ALT: 12 IU/L (ref 0–32)
AST: 28 IU/L (ref 0–40)
Albumin: 4.3 g/dL (ref 3.7–4.7)
Alkaline Phosphatase: 86 IU/L (ref 48–129)
BUN/Creatinine Ratio: 22 (ref 12–28)
BUN: 27 mg/dL (ref 8–27)
Bilirubin Total: 0.5 mg/dL (ref 0.0–1.2)
CO2: 23 mmol/L (ref 20–29)
Calcium: 9.1 mg/dL (ref 8.7–10.3)
Chloride: 101 mmol/L (ref 96–106)
Creatinine, Ser: 1.24 mg/dL — ABNORMAL HIGH (ref 0.57–1.00)
Globulin, Total: 3 g/dL (ref 1.5–4.5)
Glucose: 108 mg/dL — ABNORMAL HIGH (ref 70–99)
Potassium: 3.9 mmol/L (ref 3.5–5.2)
Sodium: 138 mmol/L (ref 134–144)
Total Protein: 7.3 g/dL (ref 6.0–8.5)
eGFR: 42 mL/min/1.73 — ABNORMAL LOW (ref >59)

## 2023-11-20 LAB — THYROID PANEL WITH TSH
Free Thyroxine Index: 2.6 (ref 1.2–4.9)
T3 Uptake Ratio: 28 % (ref 24–39)
T4, Total: 9.3 ug/dL (ref 4.5–12.0)
TSH: 1.61 u[IU]/mL (ref 0.450–4.500)

## 2023-11-22 ENCOUNTER — Ambulatory Visit: Payer: Self-pay | Admitting: Nurse Practitioner

## 2023-12-03 ENCOUNTER — Encounter: Payer: Self-pay | Admitting: Family Medicine

## 2023-12-03 ENCOUNTER — Ambulatory Visit (INDEPENDENT_AMBULATORY_CARE_PROVIDER_SITE_OTHER): Admitting: Family Medicine

## 2023-12-03 VITALS — BP 133/72 | HR 65 | Temp 98.2°F | Ht 63.0 in | Wt 153.0 lb

## 2023-12-03 DIAGNOSIS — G309 Alzheimer's disease, unspecified: Secondary | ICD-10-CM | POA: Diagnosis not present

## 2023-12-03 DIAGNOSIS — M545 Low back pain, unspecified: Secondary | ICD-10-CM

## 2023-12-03 DIAGNOSIS — R410 Disorientation, unspecified: Secondary | ICD-10-CM

## 2023-12-03 DIAGNOSIS — F02A0 Dementia in other diseases classified elsewhere, mild, without behavioral disturbance, psychotic disturbance, mood disturbance, and anxiety: Secondary | ICD-10-CM | POA: Diagnosis not present

## 2023-12-03 LAB — URINALYSIS, COMPLETE
Bilirubin, UA: NEGATIVE
Glucose, UA: NEGATIVE
Ketones, UA: NEGATIVE
Nitrite, UA: NEGATIVE
Protein,UA: NEGATIVE
Specific Gravity, UA: <=1.005 — AB (ref 1.005–1.030)
Urobilinogen, Ur: 0.2 mg/dL (ref 0.2–1.0)
pH, UA: 5.5 (ref 5.0–7.5)

## 2023-12-03 LAB — MICROSCOPIC EXAMINATION
Renal Epithel, UA: NONE SEEN /HPF
Yeast, UA: NONE SEEN

## 2023-12-03 NOTE — Progress Notes (Signed)
 BP 133/72   Pulse 65   Temp 98.2 F (36.8 C)   Ht 5' 3 (1.6 m)   Wt 153 lb (69.4 kg)   SpO2 96%   BMI 27.10 kg/m    Subjective:   Patient ID: Audrey Manning, female    DOB: November 17, 1932, 89 y.o.   MRN: [id number]  HPI: Audrey Manning is a 89 y.o. female presenting on 12/03/2023 for Confusion at night   Discussed the use of AI scribe software for clinical note transcription with the patient, who gave verbal consent to proceed.  History of Present Illness   Audrey Manning is an 89 year old female who presents with confusion and lower back pain.  Confusion and sleep disturbance - Intermittent confusion primarily at night over the past two weeks - Confusion was more pronounced for two days, then improved - No confusion reported in the last four days - Increased sleep recently, going to bed around 7:00 to 7:30 PM - Recent stress related to a nearby cabbage field  Lower back pain - Pain across the lower back - No associated bowel issues, diarrhea, or constipation - No blood in urine - No kidney pain, bladder pain, or abdominal pain  Urinary frequency - Increased urinary frequency at times, particularly after taking morning diuretic - No dysuria or pain with urination  Right lower extremity edema - Chronic right leg swelling without recent worsening  Skin texture changes - Skin feels rough like 'sandpaper' - Continues to use moisturizer        Mmse 25/30 today    Relevant past medical, surgical, family and social history reviewed and updated as indicated. Interim medical history since our last visit reviewed. Allergies and medications reviewed and updated.  Review of Systems  Constitutional:  Negative for chills and fever.  Eyes:  Negative for redness and visual disturbance.  Respiratory:  Negative for chest tightness and shortness of breath.   Cardiovascular:  Positive for leg swelling. Negative for chest pain.  Genitourinary:  Negative for difficulty urinating,  dysuria, frequency, hematuria and urgency.  Musculoskeletal:  Negative for back pain and gait problem.  Skin:  Negative for rash.  Neurological:  Negative for light-headedness and headaches.  Psychiatric/Behavioral:  Positive for confusion and decreased concentration. Negative for agitation and behavioral problems.   All other systems reviewed and are negative.   Per HPI unless specifically indicated above   Allergies as of 12/03/2023       Reactions   Ace Inhibitors Cough        Medication List        Accurate as of December 03, 2023 11:41 AM. If you have any questions, ask your nurse or doctor.          amLODipine  10 MG tablet Commonly known as: NORVASC  Take 1 tablet (10 mg total) by mouth daily.   Fish Oil 300 MG Caps Take by mouth.   furosemide  40 MG tablet Commonly known as: LASIX  Take 1 tablet (40 mg total) by mouth daily.   levothyroxine  50 MCG tablet Commonly known as: SYNTHROID  Take 1 tablet (50 mcg total) by mouth daily.   metoprolol  tartrate 50 MG tablet Commonly known as: LOPRESSOR  Take 1 tablet (50 mg total) by mouth 2 (two) times daily.   simvastatin  20 MG tablet Commonly known as: ZOCOR  Take 1 tablet (20 mg total) by mouth at bedtime.   triamcinolone  cream 0.1 % Commonly known as: KENALOG  APPLY  CREAM EXTERNALLY TO AFFECTED AREA TWICE DAILY  Vitamin D3 25 MCG (1000 UT) Caps Take 1 capsule (1,000 Units total) by mouth daily.         Objective:   BP 133/72   Pulse 65   Temp 98.2 F (36.8 C)   Ht 5' 3 (1.6 m)   Wt 153 lb (69.4 kg)   SpO2 96%   BMI 27.10 kg/m   Wt Readings from Last 3 Encounters:  12/03/23 153 lb (69.4 kg)  11/19/23 155 lb (70.3 kg)  10/01/23 155 lb (70.3 kg)    Physical Exam Physical Exam   CHEST: Lungs clear to auscultation bilaterally. CARDIOVASCULAR: Heart regular rate and rhythm. EXTREMITIES: 3+ edema in right leg. Minimal edema in left leg. MUSCULOSKELETAL: Hand strength 5/5 bilaterally. SKIN:  Skin normal.       Urinalysis showed trace RBCs and trace WBCs and trace bacteria, nothing significant  Assessment & Plan:   Problem List Items Addressed This Visit   None Visit Diagnoses       Confusion    -  Primary   Relevant Orders   Urinalysis, Complete   Urine Culture     Acute right-sided low back pain without sciatica       Relevant Orders   Urinalysis, Complete   Urine Culture     Mild Alzheimer's dementia without behavioral disturbance, psychotic disturbance, mood disturbance, or anxiety, unspecified timing of dementia onset (HCC)              Intermittent confusion Intermittent confusion, improved over four days. Possible UTI or early dementia. Sleep and stress may exacerbate. - Check urine analysis for UTI.  Urinalysis did not show anything major that would be concerning for infection, will run culture to be sure but do not think it is infectious - Encouraged adequate sleep and stress reduction.   Right leg edema Chronic right leg edema with 3+ swelling, stable. Minimal swelling in left leg.  The swelling of the right leg is not new       Follow up plan: Return if symptoms worsen or fail to improve.  Counseling provided for all of the vaccine components Orders Placed This Encounter  Procedures   Urine Culture   Urinalysis, Complete    Fonda Levins, MD [facility] 12/03/2023, 11:41 AM

## 2023-12-05 LAB — URINE CULTURE

## 2023-12-06 ENCOUNTER — Ambulatory Visit: Payer: Self-pay | Admitting: Family Medicine

## 2024-03-28 ENCOUNTER — Inpatient Hospital Stay: Admitting: Family Medicine

## 2024-04-03 ENCOUNTER — Inpatient Hospital Stay: Admitting: Family Medicine

## 2024-05-19 ENCOUNTER — Ambulatory Visit: Payer: Self-pay | Admitting: Nurse Practitioner
